# Patient Record
Sex: Female | Born: 1985 | Race: White | Hispanic: No | Marital: Married | Smoking: Current some day smoker
Health system: Southern US, Community
[De-identification: ages and names within clinical notes are randomized; demographics above are authoritative.]

## PROBLEM LIST (undated history)

## (undated) DIAGNOSIS — L4050 Arthropathic psoriasis, unspecified: Secondary | ICD-10-CM

## (undated) DIAGNOSIS — R519 Headache, unspecified: Secondary | ICD-10-CM

## (undated) DIAGNOSIS — F329 Major depressive disorder, single episode, unspecified: Secondary | ICD-10-CM

## (undated) DIAGNOSIS — R51 Headache: Secondary | ICD-10-CM

## (undated) DIAGNOSIS — F419 Anxiety disorder, unspecified: Secondary | ICD-10-CM

## (undated) DIAGNOSIS — F32A Depression, unspecified: Secondary | ICD-10-CM

## (undated) DIAGNOSIS — H7292 Unspecified perforation of tympanic membrane, left ear: Secondary | ICD-10-CM

## (undated) HISTORY — DX: Headache: R51

## (undated) HISTORY — PX: OTHER SURGICAL HISTORY: SHX169

## (undated) HISTORY — PX: MYRINGOTOMY WITH TUBE PLACEMENT: SHX5663

## (undated) HISTORY — DX: Headache, unspecified: R51.9

## (undated) HISTORY — DX: Arthropathic psoriasis, unspecified: L40.50

---
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
## 2007-01-21 ENCOUNTER — Encounter: Payer: Self-pay | Admitting: Obstetrics and Gynecology

## 2007-03-27 ENCOUNTER — Observation Stay: Payer: Self-pay | Admitting: Obstetrics and Gynecology

## 2007-04-16 ENCOUNTER — Observation Stay: Payer: Self-pay | Admitting: Obstetrics & Gynecology

## 2007-04-27 ENCOUNTER — Inpatient Hospital Stay: Payer: Self-pay

## 2010-09-10 ENCOUNTER — Emergency Department: Payer: Self-pay | Admitting: Emergency Medicine

## 2012-12-15 ENCOUNTER — Emergency Department: Payer: Self-pay | Admitting: Emergency Medicine

## 2012-12-30 ENCOUNTER — Emergency Department: Payer: Self-pay | Admitting: Emergency Medicine

## 2013-09-26 ENCOUNTER — Ambulatory Visit: Payer: Self-pay | Admitting: Family Medicine

## 2014-06-10 IMAGING — CT CT HEAD WITHOUT CONTRAST
1 series · 16 of 30 positions shown, 20 images · non-contrast
Comparison: Report from 05/04/2003 exam. Images not available for
direct review.

CLINICAL DATA: Headaches.

EXAM:
CT HEAD WITHOUT CONTRAST
TECHNIQUE: Contiguous axial images were obtained from the base of the skull
through the vertex without intravenous contrast.

[Series 2: head wo · axial · 0.43mm/px · z∈[-129,+15]mm · 16 of 36 slices shown, 20 images]
[im 2/36  brain]
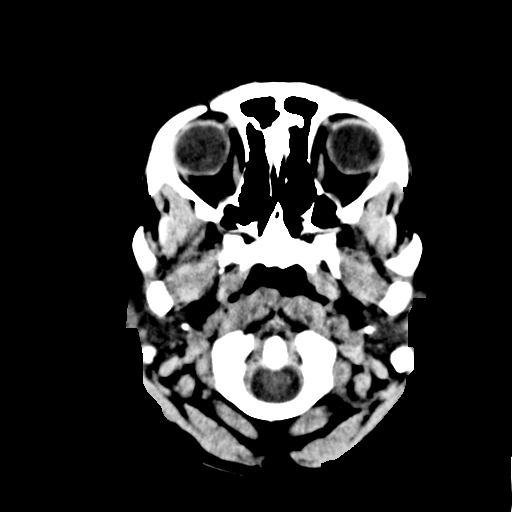
[im 2/36  bone]
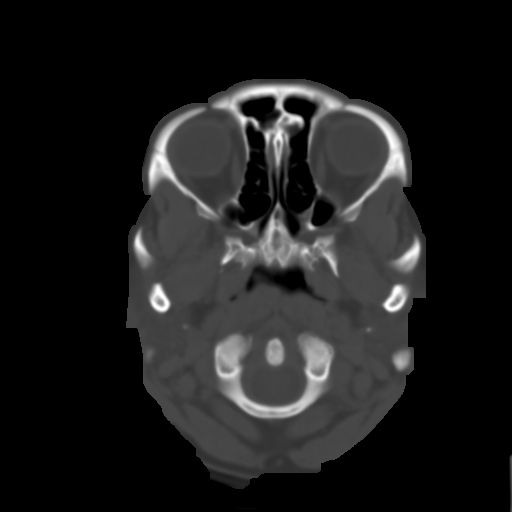
[im 4/36  brain]
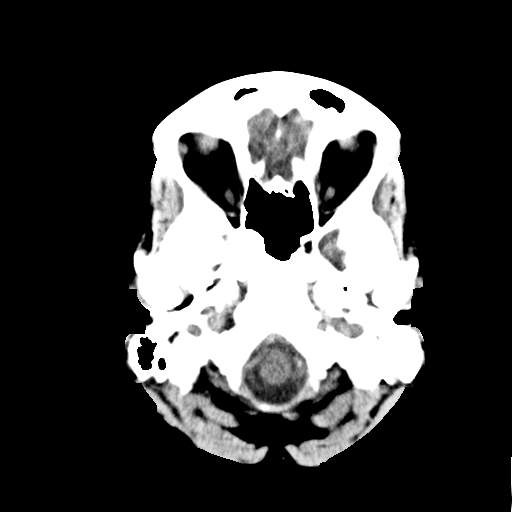
[im 7/36  brain]
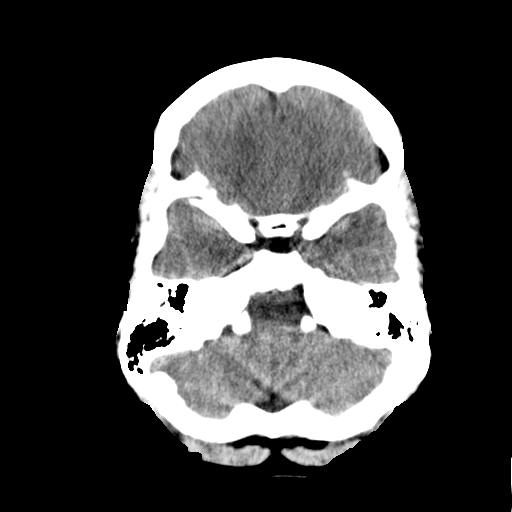
[im 9/36  brain]
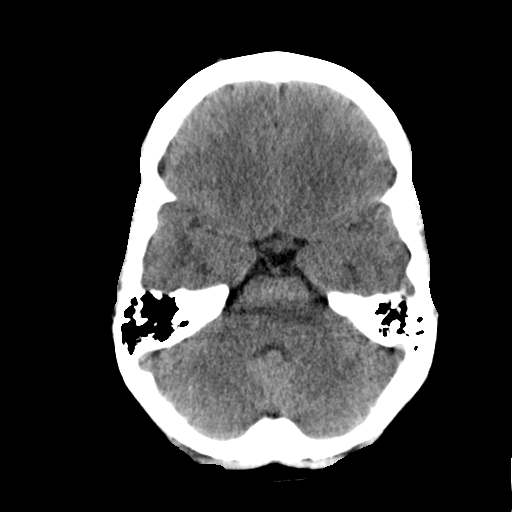
[im 10/36  brain]
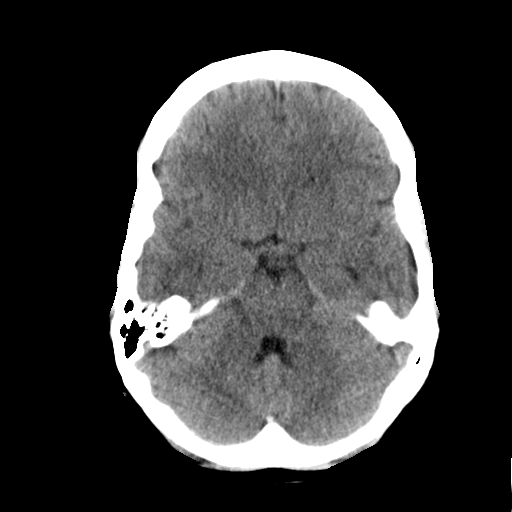
[im 10/36  bone]
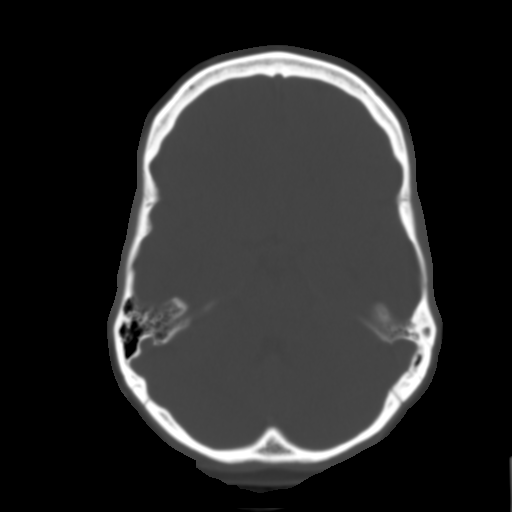
[im 13/36  brain]
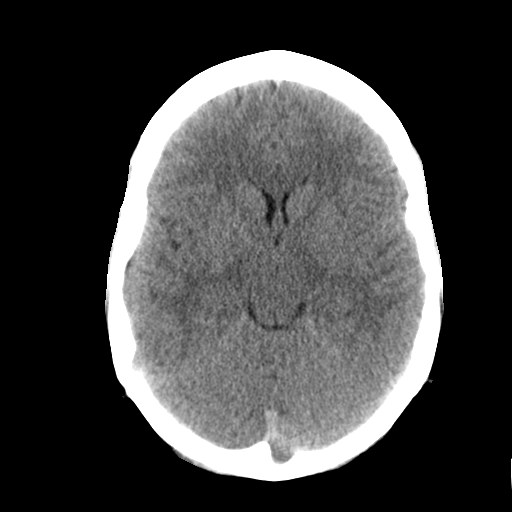
[im 15/36  brain]
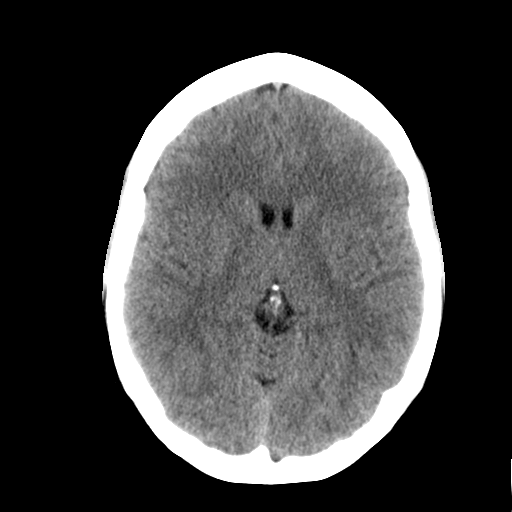
[im 17/36  brain]
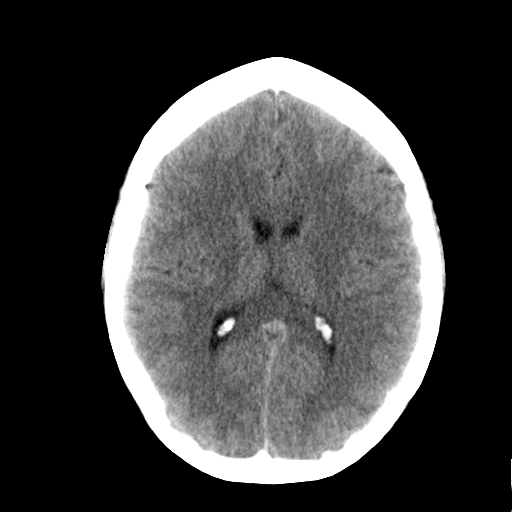
[im 19/36  brain]
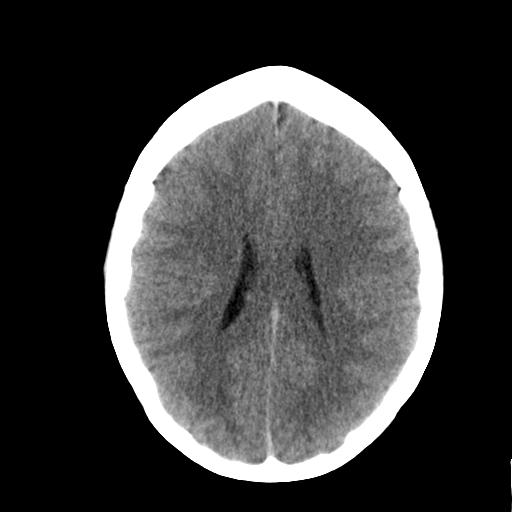
[im 19/36  bone]
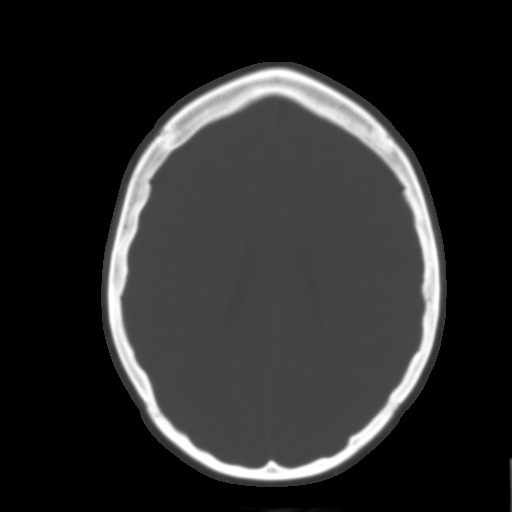
[im 21/36  brain]
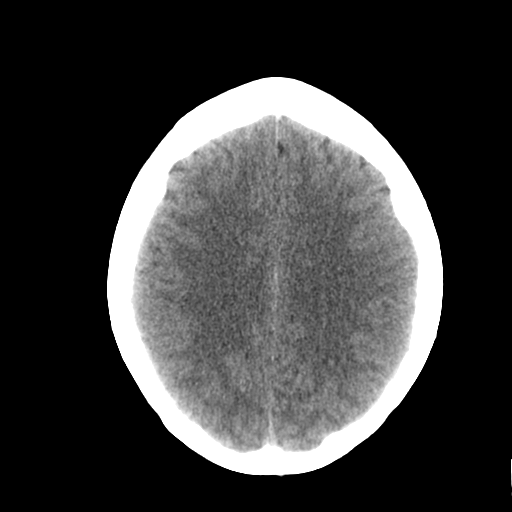
[im 23/36  brain]
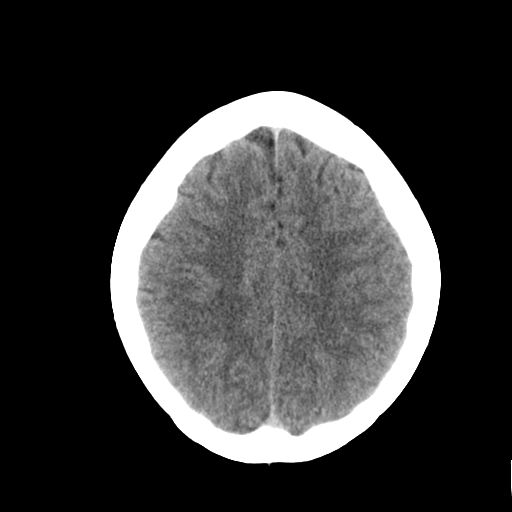
[im 26/36  brain]
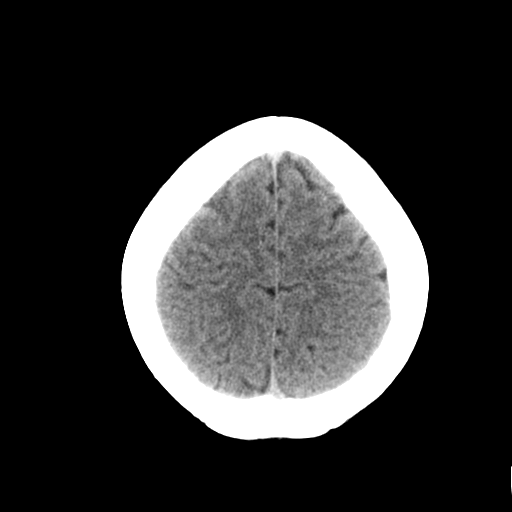
[im 27/36  brain]
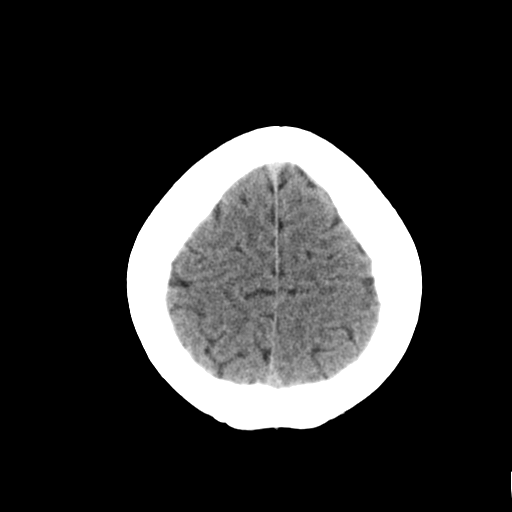
[im 27/36  bone]
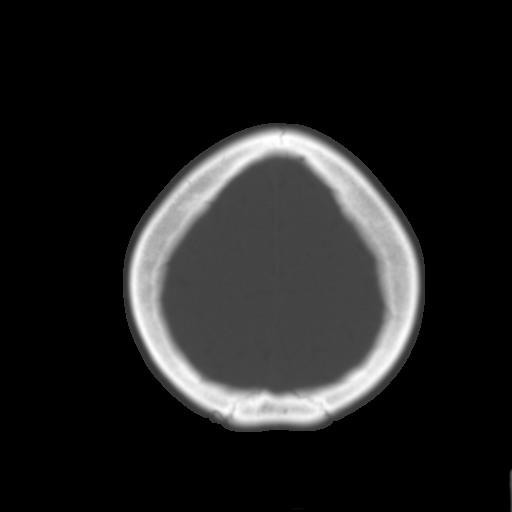
[im 29/36  brain]
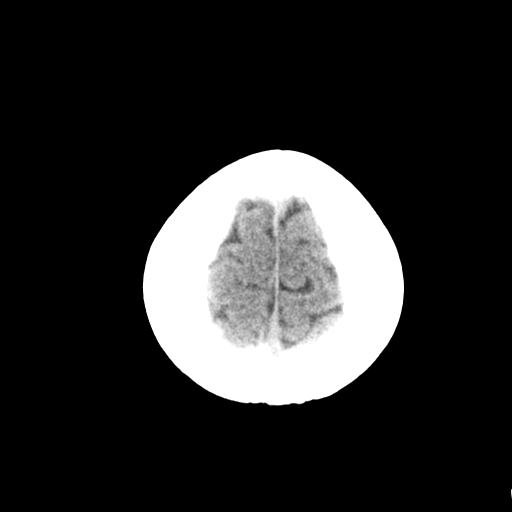
[im 32/36  brain]
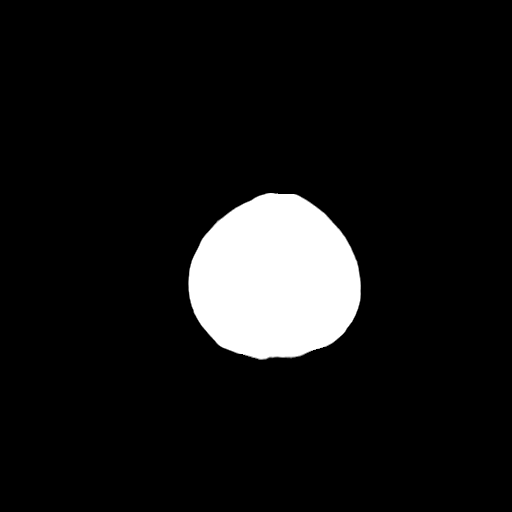
[im 34/36  brain]
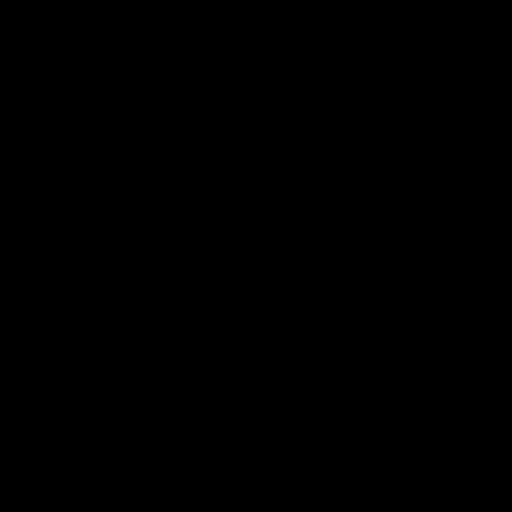

[16 of 30 positions shown; findings below may reference images not displayed]

FINDINGS: No intracranial hemorrhage.

No CT evidence of large acute infarct.

No hydrocephalus.

No intracranial mass lesion noted on this unenhanced exam.

Cerebellar tonsils may be minimally low lying but within the range
of normal limits.

Mastoid air cells, middle ear cavities and visualized paranasal
sinuses are clear.

If there are atypical headaches and further delineation is
clinically desired then MR imaging may be considered.
IMPRESSION: Negative unenhanced head CT as detailed above.

## 2014-06-19 ENCOUNTER — Emergency Department: Payer: Self-pay | Admitting: Emergency Medicine

## 2014-06-19 LAB — CBC WITH DIFFERENTIAL/PLATELET
Basophil #: 0.1 10*3/uL (ref 0.0–0.1)
Basophil %: 0.7 %
EOS PCT: 2.5 %
Eosinophil #: 0.3 10*3/uL (ref 0.0–0.7)
HCT: 43.1 % (ref 35.0–47.0)
HGB: 14.4 g/dL (ref 12.0–16.0)
LYMPHS PCT: 33.3 %
Lymphocyte #: 4 10*3/uL — ABNORMAL HIGH (ref 1.0–3.6)
MCH: 30 pg (ref 26.0–34.0)
MCHC: 33.4 g/dL (ref 32.0–36.0)
MCV: 90 fL (ref 80–100)
Monocyte #: 0.7 x10 3/mm (ref 0.2–0.9)
Monocyte %: 5.8 %
NEUTROS PCT: 57.7 %
Neutrophil #: 7 10*3/uL — ABNORMAL HIGH (ref 1.4–6.5)
PLATELETS: 318 10*3/uL (ref 150–440)
RBC: 4.81 10*6/uL (ref 3.80–5.20)
RDW: 12.3 % (ref 11.5–14.5)
WBC: 12.1 10*3/uL — ABNORMAL HIGH (ref 3.6–11.0)

## 2014-06-19 LAB — URINALYSIS, COMPLETE
BACTERIA: NONE SEEN
BILIRUBIN, UR: NEGATIVE
Blood: NEGATIVE
GLUCOSE, UR: NEGATIVE mg/dL (ref 0–75)
Ketone: NEGATIVE
Leukocyte Esterase: NEGATIVE
Nitrite: NEGATIVE
Ph: 5 (ref 4.5–8.0)
Protein: NEGATIVE
RBC,UR: 1 /HPF (ref 0–5)
SPECIFIC GRAVITY: 1.01 (ref 1.003–1.030)
Squamous Epithelial: 3

## 2014-06-19 LAB — COMPREHENSIVE METABOLIC PANEL
ANION GAP: 8 (ref 7–16)
AST: 31 U/L (ref 15–37)
Albumin: 3.7 g/dL (ref 3.4–5.0)
Alkaline Phosphatase: 71 U/L (ref 46–116)
BUN: 20 mg/dL — AB (ref 7–18)
Bilirubin,Total: 0.1 mg/dL — ABNORMAL LOW (ref 0.2–1.0)
CHLORIDE: 104 mmol/L (ref 98–107)
CO2: 26 mmol/L (ref 21–32)
Calcium, Total: 8.8 mg/dL (ref 8.5–10.1)
Creatinine: 0.81 mg/dL (ref 0.60–1.30)
Glucose: 102 mg/dL — ABNORMAL HIGH (ref 65–99)
Osmolality: 278 (ref 275–301)
POTASSIUM: 3.5 mmol/L (ref 3.5–5.1)
SGPT (ALT): 32 U/L (ref 14–63)
Sodium: 138 mmol/L (ref 136–145)
TOTAL PROTEIN: 7.9 g/dL (ref 6.4–8.2)

## 2014-06-19 LAB — TROPONIN I: Troponin-I: <0.02 ng/mL

## 2014-09-20 ENCOUNTER — Encounter
Admission: RE | Admit: 2014-09-20 | Discharge: 2014-09-20 | Disposition: A | Discharge status: Home / Self Care | Payer: Managed Care, Other (non HMO) | Source: Ambulatory Visit | Attending: Specialist | Admitting: Specialist

## 2014-09-20 DIAGNOSIS — Z029 Encounter for administrative examinations, unspecified: Secondary | ICD-10-CM | POA: Insufficient documentation

## 2014-09-20 HISTORY — DX: Major depressive disorder, single episode, unspecified: F32.9

## 2014-09-20 HISTORY — DX: Depression, unspecified: F32.A

## 2014-09-20 HISTORY — DX: Anxiety disorder, unspecified: F41.9

## 2014-09-20 NOTE — Patient Instructions (Signed)
  Your procedure is scheduled on5/20/16 Report to Day Surgery. To find out your arrival time please call [phone] between 1PM - 3PM on 09/28/14 .  Remember: Instructions that are not followed completely may result in serious medical risk, up to and including death, or upon the discretion of your surgeon and anesthesiologist your surgery may need to be rescheduled.    __x__ 1. Do not eat food or drink liquids after midnight. No gum chewing or hard candies.     ____ 2. No Alcohol for 24 hours before or after surgery.   ____ 3. Bring all medications with you on the day of surgery if instructed.    __x__ 4. Notify your doctor if there is any change in your medical condition     (cold, fever, infections).     Do not wear jewelry, make-up, hairpins, clips or nail polish.  Do not wear lotions, powders, or perfumes. You may wear deodorant.  Do not shave 48 hours prior to surgery. Men may shave face and neck.  Do not bring valuables to the hospital.    [facility] belongings or valuables.               Contacts, dentures or bridgework may not be worn into surgery.  Leave your suitcase in the car. After surgery it may be brought to your room.  For patients admitted to the hospital, discharge time is determined by your                treatment team.   Patients discharged the day of surgery will not be allowed to drive home.   Please read over the following fact sheets that you were given:     ____ Take these medicines the morning of surgery with A SIP OF WATER:    1.   2.   3.   4.  5.  6.  ____ Fleet Enema (as directed)   __x__ Use CHG Soap as directed  ____ Use inhalers on the day of surgery  ____ Stop metformin 2 days prior to surgery    ____ Take 1/2 of usual insulin dose the night before surgery and none on the morning of surgery.   ____ Stop Coumadin/Plavix/aspirin on  ____ Stop Anti-inflammatories on    ____ Stop supplements until after  surgery.    ____ Bring C-Pap to the hospital.

## 2014-09-29 ENCOUNTER — Ambulatory Visit: Payer: Managed Care, Other (non HMO) | Admitting: Certified Registered Nurse Anesthetist

## 2014-09-29 ENCOUNTER — Ambulatory Visit
Admission: RE | Admit: 2014-09-29 | Discharge: 2014-09-29 | Disposition: A | Discharge status: Home / Self Care | Payer: Managed Care, Other (non HMO) | Source: Ambulatory Visit | Attending: Specialist | Admitting: Specialist

## 2014-09-29 ENCOUNTER — Encounter
Admission: RE | Disposition: A | Discharge status: Home / Self Care | Payer: Self-pay | Source: Ambulatory Visit | Attending: Specialist

## 2014-09-29 DIAGNOSIS — G5601 Carpal tunnel syndrome, right upper limb: Secondary | ICD-10-CM | POA: Insufficient documentation

## 2014-09-29 DIAGNOSIS — Z886 Allergy status to analgesic agent status: Secondary | ICD-10-CM | POA: Diagnosis not present

## 2014-09-29 DIAGNOSIS — Z809 Family history of malignant neoplasm, unspecified: Secondary | ICD-10-CM | POA: Diagnosis not present

## 2014-09-29 DIAGNOSIS — F17200 Nicotine dependence, unspecified, uncomplicated: Secondary | ICD-10-CM | POA: Diagnosis not present

## 2014-09-29 DIAGNOSIS — Z8261 Family history of arthritis: Secondary | ICD-10-CM | POA: Insufficient documentation

## 2014-09-29 DIAGNOSIS — Z79899 Other long term (current) drug therapy: Secondary | ICD-10-CM | POA: Insufficient documentation

## 2014-09-29 HISTORY — PX: CARPAL TUNNEL RELEASE: SHX101

## 2014-09-29 LAB — POCT PREGNANCY, URINE: Preg Test, Ur: NEGATIVE

## 2014-09-29 SURGERY — CARPAL TUNNEL RELEASE
Anesthesia: General | Laterality: Right | Wound class: Clean

## 2014-09-29 MED ORDER — LABETALOL HCL 5 MG/ML IV SOLN
INTRAVENOUS | Status: AC
  Filled 2014-09-29: qty 4

## 2014-09-29 MED ORDER — FENTANYL CITRATE (PF) 100 MCG/2ML IJ SOLN
INTRAMUSCULAR | Status: AC
  Filled 2014-09-29: qty 2

## 2014-09-29 MED ORDER — MIDAZOLAM HCL 2 MG/2ML IJ SOLN
INTRAMUSCULAR | Status: DC | PRN
  Administered 2014-09-29: 2 mg via INTRAVENOUS

## 2014-09-29 MED ORDER — FAMOTIDINE 20 MG PO TABS
20.0000 mg | ORAL_TABLET | Freq: Once | ORAL | Status: AC
  Administered 2014-09-29: 20 mg via ORAL

## 2014-09-29 MED ORDER — CHLORHEXIDINE GLUCONATE 4 % EX LIQD
60.0000 mL | Freq: Once | CUTANEOUS | Status: AC
  Administered 2014-09-29: 4 via TOPICAL

## 2014-09-29 MED ORDER — PROPOFOL 10 MG/ML IV BOLUS
INTRAVENOUS | Status: DC | PRN
  Administered 2014-09-29: 20 mg via INTRAVENOUS
  Administered 2014-09-29: 180 mg via INTRAVENOUS

## 2014-09-29 MED ORDER — GLYCOPYRROLATE 0.2 MG/ML IJ SOLN
INTRAMUSCULAR | Status: DC | PRN
  Administered 2014-09-29: 0.2 mg via INTRAVENOUS

## 2014-09-29 MED ORDER — LACTATED RINGERS IV SOLN
INTRAVENOUS | Status: DC
  Administered 2014-09-29: 07:00:00 via INTRAVENOUS

## 2014-09-29 MED ORDER — FENTANYL CITRATE (PF) 100 MCG/2ML IJ SOLN
25.0000 ug | INTRAMUSCULAR | Status: DC | PRN
  Administered 2014-09-29 (×2): 25 ug via INTRAVENOUS

## 2014-09-29 MED ORDER — ONDANSETRON HCL 4 MG/2ML IJ SOLN
INTRAMUSCULAR | Status: DC | PRN
  Administered 2014-09-29: 4 mg via INTRAVENOUS

## 2014-09-29 MED ORDER — ONDANSETRON HCL 4 MG/2ML IJ SOLN: 4.0000 mg | Freq: Once | INTRAMUSCULAR | Status: DC | PRN

## 2014-09-29 MED ORDER — LABETALOL HCL 5 MG/ML IV SOLN: 2.0000 mg | Freq: Once | INTRAVENOUS | Status: DC

## 2014-09-29 MED ORDER — FAMOTIDINE 20 MG PO TABS
ORAL_TABLET | ORAL | Status: AC
  Administered 2014-09-29: 20 mg via ORAL
  Filled 2014-09-29: qty 1

## 2014-09-29 MED ORDER — BUPIVACAINE HCL (PF) 0.5 % IJ SOLN
INTRAMUSCULAR | Status: AC
  Filled 2014-09-29: qty 30

## 2014-09-29 MED ORDER — BUPIVACAINE HCL 0.5 % IJ SOLN
INTRAMUSCULAR | Status: DC | PRN
  Administered 2014-09-29: 7 mL

## 2014-09-29 MED ORDER — HYDROCODONE-ACETAMINOPHEN 5-325 MG PO TABS: 1.0000 | ORAL_TABLET | Freq: Four times a day (QID) | ORAL | Status: DC | PRN

## 2014-09-29 MED ORDER — FENTANYL CITRATE (PF) 100 MCG/2ML IJ SOLN
INTRAMUSCULAR | Status: DC | PRN
  Administered 2014-09-29: 50 ug via INTRAVENOUS
  Administered 2014-09-29 (×2): 25 ug via INTRAVENOUS

## 2014-09-29 MED ORDER — LIDOCAINE HCL (CARDIAC) 20 MG/ML IV SOLN
INTRAVENOUS | Status: DC | PRN
  Administered 2014-09-29: 100 mg via INTRAVENOUS

## 2014-09-29 SURGICAL SUPPLY — 22 items
BANDAGE ELASTIC 3 CLIP NS LF (GAUZE/BANDAGES/DRESSINGS) ×3 IMPLANT
BNDG ESMARK 4X12 TAN STRL LF (GAUZE/BANDAGES/DRESSINGS) ×3 IMPLANT
CANISTER SUCT 1200ML W/VALVE (MISCELLANEOUS) ×3 IMPLANT
CAST PADDING 3X4FT ST 30246 (SOFTGOODS) ×4
CHLORAPREP W/TINT 26ML (MISCELLANEOUS) ×3 IMPLANT
GAUZE PETRO XEROFOAM 1X8 (MISCELLANEOUS) ×3 IMPLANT
GAUZE SPONGE 4X4 12PLY STRL (GAUZE/BANDAGES/DRESSINGS) ×3 IMPLANT
GLOVE BIO SURGEON STRL SZ7.5 (GLOVE) ×3 IMPLANT
GOWN STRL REUS W/ TWL LRG LVL3 (GOWN DISPOSABLE) ×2 IMPLANT
GOWN STRL REUS W/TWL LRG LVL3 (GOWN DISPOSABLE) ×4
KIT RM TURNOVER STRD PROC AR (KITS) ×3 IMPLANT
NS IRRIG 500ML POUR BTL (IV SOLUTION) ×3 IMPLANT
PACK EXTREMITY ARMC (MISCELLANEOUS) ×3 IMPLANT
PAD CAST CTTN 3X4 STRL (SOFTGOODS) ×2 IMPLANT
PAD GROUND ADULT SPLIT (MISCELLANEOUS) ×3 IMPLANT
PAD PREP 24X41 OB/GYN DISP (PERSONAL CARE ITEMS) IMPLANT
PADDING CAST 3IN STRL (MISCELLANEOUS) ×2
PADDING CAST BLEND 3X4 STRL (MISCELLANEOUS) ×1 IMPLANT
STOCKINETTE STRL 4IN 9604848 (GAUZE/BANDAGES/DRESSINGS) ×3 IMPLANT
SUT ETHILON 4-0 (SUTURE) ×2
SUT ETHILON 4-0 FS2 18XMFL BLK (SUTURE) ×1
SUTURE ETHLN 4-0 FS2 18XMF BLK (SUTURE) ×1 IMPLANT

## 2014-09-29 NOTE — Discharge Instructions (Signed)
May remove entire bandage in 24 hours, bathe, get wet, apply Band aid and wear brace prn Elevate hand as much as possible for 48 hours

## 2014-09-29 NOTE — Anesthesia Postprocedure Evaluation (Signed)
  Anesthesia Post-op Note  Patient: Savannah [name]  Procedure(s) Performed: Procedure(s): CARPAL TUNNEL RELEASE (Right)  Anesthesia type:General LMA  Patient location: PACU  Post pain: Pain level controlled  Post assessment: Post-op Vital signs reviewed, Patient's Cardiovascular Status Stable, Respiratory Function Stable, Patent Airway and No signs of Nausea or vomiting  Post vital signs: Reviewed and stable  Last Vitals:  Filed Vitals:   09/29/14 0811  BP:   Pulse:   Temp: 36.6 C  Resp:     Level of consciousness: awake, alert  and patient cooperative  Complications: No apparent anesthesia complications

## 2014-09-29 NOTE — Addendum Note (Signed)
Addendum  created 09/29/14 0940 by Malva Coganatherine Joniel Graumann, CRNA   Modules edited: Anesthesia Flowsheet

## 2014-09-29 NOTE — Anesthesia Preprocedure Evaluation (Signed)
Anesthesia Evaluation  Patient identified by MRN, date of birth, ID band Patient awake    Reviewed: Allergy & Precautions, H&P , NPO status , Patient's Chart, lab work & pertinent test results, reviewed documented beta blocker date and time   Airway Mallampati: II  TM Distance: >3 FB Neck ROM: full    Dental   Pulmonary Current Smoker,          Cardiovascular Rate:Normal     Neuro/Psych    GI/Hepatic   Endo/Other    Renal/GU      Musculoskeletal   Abdominal   Peds  Hematology   Anesthesia Other Findings   Reproductive/Obstetrics                             Anesthesia Physical Anesthesia Plan  ASA: II  Anesthesia Plan: General LMA   Post-op Pain Management:    Induction:   Airway Management Planned:   Additional Equipment:   Intra-op Plan:   Post-operative Plan:   Informed Consent: I have reviewed the patients History and Physical, chart, labs and discussed the procedure including the risks, benefits and alternatives for the proposed anesthesia with the patient or authorized representative who has indicated his/her understanding and acceptance.     Plan Discussed with: CRNA  Anesthesia Plan Comments:         Anesthesia Quick Evaluation  

## 2014-09-29 NOTE — Brief Op Note (Signed)
09/29/2014  8:15 AM  PATIENT:  Savannah [name]  29 y.o. female  PRE-OPERATIVE DIAGNOSIS:  CARPAL TUNNEL SYNDROME  POST-OPERATIVE DIAGNOSIS:  CARPAL TUNNEL SYNDROME  PROCEDURE:  Procedure(s): CARPAL TUNNEL RELEASE (Right)  SURGEON:  Surgeon(s) and Role:    * Myra Rudehristopher Neven Fina, MD - Primary  PHYSICIAN ASSISTANT:   ASSISTANTS: none   ANESTHESIA:   general  EBL:  Total I/O In: 500 [I.V.:500] Out: -   BLOOD ADMINISTERED:none  DRAINS: none   LOCAL MEDICATIONS USED:  MARCAINE     SPECIMEN:  No Specimen  DISPOSITION OF SPECIMEN:  N/A  COUNTS:  YES  TOURNIQUET:    DICTATION: .Other Dictation: Dictation Number [id number]  PLAN OF CARE: Discharge to home after PACU  PATIENT DISPOSITION:  PACU - hemodynamically stable.   Delay start of Pharmacological VTE agent (>24hrs) due to surgical blood loss or risk of bleeding: not applicable

## 2014-09-29 NOTE — Transfer of Care (Signed)
Immediate Anesthesia Transfer of Care Note  Patient: Savannah [name]  Procedure(s) Performed: Procedure(s): CARPAL TUNNEL RELEASE (Right)  Patient Location: PACU  Anesthesia Type:General  Level of Consciousness: awake, alert  and oriented  Airway & Oxygen Therapy: Patient Spontanous Breathing and Patient connected to face mask oxygen  Post-op Assessment: Report given to RN and Post -op Vital signs reviewed and stable  Post vital signs: Reviewed and stable  Last Vitals:  Filed Vitals:   09/29/14 0810  BP: 131/95  Pulse: 93  Temp: 36.5 C  Resp: 16    Complications: No apparent anesthesia complications

## 2014-09-29 NOTE — Op Note (Signed)
NAMRon Agee:  Archambeau, Shadow             ACCOUNT NO.:  [id number]  MEDICAL RECORD NO.:  [id number]  LOCATION:  [facility]                         FACILITY:  [facility]  PHYSICIAN:  Reita Chardhris Misk Galentine, MD        DATE OF BIRTH:  [birth year]  DATE OF PROCEDURE:  09/29/2014 DATE OF DISCHARGE:  09/29/2014                              OPERATIVE REPORT   PREOPERATIVE DIAGNOSIS:  Right carpal tunnel syndrome.  POSTOPERATIVE DIAGNOSIS:  Right carpal tunnel syndrome.  PROCEDURE PERFORMED:  Right carpal tunnel release.  SURGEON:  Reita Chardhris Mariah Gerstenberger, MD  ANESTHESIA:  General.  COMPLICATIONS:  None.  TOURNIQUET TIME:  15 minutes.  DESCRIPTION OF PROCEDURE:  After adequate induction of general anesthesia, the right upper extremity is thoroughly prepped with alcohol and ChloraPrep and draped in standard sterile fashion.  The extremity is wrapped out with Esmarch bandage and pneumatic tourniquet elevated to 250 mmHg.  Under loupe magnification, standard volar carpal tunnel incision is made, and the dissection carried down to the transverse retinacular ligament.  This is incised in the midportion with the knife. The distal release is performed with the small scissors and the proximal release is performed with the small scissors and the carpal tunnel scissors.  There is seen to be moderate to severe compression of the nerve directly beneath the ligament.  There is no mass lesion present and mild synovial hypertrophy.  The wound is thoroughly irrigated multiple times.  Skin is closed with 4-0 nylon.  The skin is infiltrated with 0.5% plain Marcaine.  A soft bulky dressing is applied, tourniquet is released.  The patient is returned to the recovery room in satisfactory condition having tolerated the procedure quite well.          ______________________________ Reita Chardhris Merick Kelleher, MD     CS/MEDQ  D:  09/29/2014  T:  09/29/2014  Job:  [id number]

## 2014-09-29 NOTE — H&P (Signed)
  29 year old female with right carpal tunnel syndrome as per H and P included in office notes appended to chart.  Procedure fully explained to patient including expected result and post op rehab.  Heart and lungs are clear.  ENT exam normal.  Plan: right carpal tunnel release this am.

## 2014-10-02 ENCOUNTER — Encounter: Payer: Self-pay | Admitting: Specialist

## 2014-12-01 ENCOUNTER — Ambulatory Visit: Payer: Managed Care, Other (non HMO) | Admitting: Psychiatry

## 2015-01-10 ENCOUNTER — Emergency Department
Admission: EM | Admit: 2015-01-10 | Discharge: 2015-01-10 | Disposition: A | Discharge status: Home / Self Care | Payer: Managed Care, Other (non HMO) | Attending: Emergency Medicine | Admitting: Emergency Medicine

## 2015-01-10 ENCOUNTER — Encounter: Payer: Self-pay | Admitting: Emergency Medicine

## 2015-01-10 ENCOUNTER — Emergency Department: Payer: Managed Care, Other (non HMO)

## 2015-01-10 DIAGNOSIS — R21 Rash and other nonspecific skin eruption: Secondary | ICD-10-CM | POA: Diagnosis not present

## 2015-01-10 DIAGNOSIS — R197 Diarrhea, unspecified: Secondary | ICD-10-CM | POA: Diagnosis not present

## 2015-01-10 DIAGNOSIS — Z720 Tobacco use: Secondary | ICD-10-CM | POA: Diagnosis not present

## 2015-01-10 DIAGNOSIS — Z79899 Other long term (current) drug therapy: Secondary | ICD-10-CM | POA: Insufficient documentation

## 2015-01-10 DIAGNOSIS — R109 Unspecified abdominal pain: Secondary | ICD-10-CM | POA: Insufficient documentation

## 2015-01-10 DIAGNOSIS — Z872 Personal history of diseases of the skin and subcutaneous tissue: Secondary | ICD-10-CM | POA: Insufficient documentation

## 2015-01-10 DIAGNOSIS — M25571 Pain in right ankle and joints of right foot: Secondary | ICD-10-CM | POA: Diagnosis present

## 2015-01-10 MED ORDER — ACETAMINOPHEN 325 MG PO TABS
650.0000 mg | ORAL_TABLET | Freq: Once | ORAL | Status: AC
  Administered 2015-01-10: 650 mg via ORAL
  Filled 2015-01-10: qty 2

## 2015-01-10 NOTE — ED Provider Notes (Signed)
[facility] Emergency Department Provider Note  ____________________________________________  Time seen: 5:20 AM  I have reviewed the triage vital signs and the nursing notes.   HISTORY  Chief Complaint Ankle Pain    HPI Savannah [name] is a 29 y.o. female presents with nontraumatic right ankle pain 2 weeks. Patient reports pain and swelling with ambulation. Patient admits to history of psoriasis and S psoriatic rash over the right ankle that is been having pain and discomfort. Patient denies any fever      Past Medical History  Diagnosis Date  . Depression   . Anxiety     There are no active problems to display for this patient.   Past Surgical History  Procedure Laterality Date  . Myringotomy with tube placement    . Cesarean section    . Carpal tunnel release Right 09/29/2014    Procedure: CARPAL TUNNEL RELEASE;  Surgeon: Myra Rude, MD;  Location: [facility] ORS;  Service: Orthopedics;  Laterality: Right;    Current Outpatient Rx  Name  Route  Sig  Dispense  Refill  . busPIRone (BUSPAR) 5 MG tablet   Oral   Take 5 mg by mouth 2 (two) times daily.         Marland Kitchen HYDROcodone-acetaminophen (NORCO) 5-325 MG per tablet   Oral   Take 1 tablet by mouth every 6 (six) hours as needed for moderate pain.   30 tablet   0   . omeprazole (PRILOSEC) 20 MG capsule   Oral   Take 20 mg by mouth 2 (two) times daily before a meal.         . venlafaxine (EFFEXOR) 75 MG tablet   Oral   Take 75 mg by mouth daily. am           Allergies Ibuprofen  No family history on file.  Social History Social History  Substance Use Topics  . Smoking status: Current Every Day Smoker -- 0.25 packs/day    Types: Cigarettes  . Smokeless tobacco: Never Used  . Alcohol Use: No    Review of Systems  Constitutional: Negative for fever. Eyes: Negative for visual changes. ENT: Negative for sore throat. Cardiovascular: Negative for chest pain. Respiratory:  Negative for shortness of breath. Gastrointestinal: Negative for abdominal pain, vomiting and diarrhea. Genitourinary: Negative for dysuria. Musculoskeletal: Negative for back pain. Positive right ankle pain Skin: Positive for rash. Neurological: Negative for headaches, focal weakness or numbness.   10-point ROS otherwise negative.  ____________________________________________   PHYSICAL EXAM:  VITAL SIGNS: ED Triage Vitals  Enc Vitals Group     BP 01/10/15 0257 112/66 mmHg     Pulse Rate 01/10/15 0257 88     Resp 01/10/15 0257 18     Temp 01/10/15 0257 97.6 F (36.4 C)     Temp Source 01/10/15 0257 Oral     SpO2 01/10/15 0257 100 %     Weight 01/10/15 0257 165 lb (74.844 kg)     Height 01/10/15 0257  (1.6 m)     Head Cir --      Peak Flow --      Pain Score 01/10/15 0257 6     Pain Loc --      Pain Edu? --      Excl. in GC? --     Constitutional: Alert and oriented. Well appearing and in no distress. Musculoskeletal: Nontender with normal range of motion in all extremities. No joint effusions.  No lower extremity tenderness nor  edema. Mild tenderness to palpation posterior right medial malleoli Neurologic:  Normal speech and language. No gross focal neurologic deficits are appreciated. Speech is normal.  Skin:  Psoriatic rash noted over right medial malleoli Psychiatric: Mood and affect are normal. Speech and behavior are normal. Patient exhibits appropriate insight and judgment.    RADIOLOGY  DG Ankle 2 Views Right (Final result) Result time: 01/10/15 05:36:26   Final result by Rad Results In Interface (01/10/15 05:36:26)   Narrative:   CLINICAL DATA: Nontraumatic right ankle pain and swelling for 2 weeks.  EXAM: RIGHT ANKLE - 2 VIEW  COMPARISON: None.  FINDINGS: There is no evidence of fracture, dislocation, or joint effusion. There is no evidence of arthropathy or other focal bone abnormality. Soft tissues are  unremarkable.  IMPRESSION: Negative.   Electronically Signed By: Ellery Plunk M.D. On: 01/10/2015 05:36         INITIAL IMPRESSION / ASSESSMENT AND PLAN / ED COURSE  Pertinent labs & imaging results that were available during my care of the patient were reviewed by me and considered in my medical decision making (see chart for details).  Right ankle x-ray revealed no gross abnormalities per radiologist. Afebrile patient, psoriatic rash does not appear to be infected. Patient pain score the time of evaluation was for no active swelling noted at the joint space. The area was not warm to touch.  ____________________________________________   FINAL CLINICAL IMPRESSION(S) / ED DIAGNOSES  Final diagnoses:  Ankle pain, right      Darci Current, MD 01/12/15 0430

## 2015-01-10 NOTE — ED Notes (Addendum)
Patient ambulatory to triage with steady gait, without difficulty or distress noted; pt reports right ankle pain/swelling x 2weeks; denies any known injury; pt with ace wrap in place to ankle; upon removal, noted dry flaky red skin noted--st psoriasis

## 2015-01-10 NOTE — ED Notes (Signed)
Patient presents to ED with c/o of right ankle pain and swelling x 2 weeks. Reports pain increases when attempting to ambulate and place weight on ankle. Skin red, dry, flaky noted to right ankle, reports has psoriasis. Patient alert and oriented x 4, respirations even and unlabored, + pedal pulses, capillary refill less than 3 sec. Slight swelling noted to ankle.

## 2015-01-10 NOTE — ED Notes (Signed)

## 2015-01-10 NOTE — Discharge Instructions (Signed)
Ankle Pain Ankle pain is a common symptom. The bones, cartilage, tendons, and muscles of the ankle joint perform a lot of work each day. The ankle joint holds your body weight and allows you to move around. Ankle pain can occur on either side or back of 1 or both ankles. Ankle pain may be sharp and burning or dull and aching. There may be tenderness, stiffness, redness, or warmth around the ankle. The pain occurs more often when a person walks or puts pressure on the ankle. CAUSES  There are many reasons ankle pain can develop. It is important to work with your caregiver to identify the cause since many conditions can impact the bones, cartilage, muscles, and tendons. Causes for ankle pain include:  Injury, including a break (fracture), sprain, or strain often due to a fall, sports, or a high-impact activity.  Swelling (inflammation) of a tendon (tendonitis).  Achilles tendon rupture.  Ankle instability after repeated sprains and strains.  Poor foot alignment.  Pressure on a nerve (tarsal tunnel syndrome).  Arthritis in the ankle or the lining of the ankle.  Crystal formation in the ankle (gout or pseudogout). DIAGNOSIS  A diagnosis is based on your medical history, your symptoms, results of your physical exam, and results of diagnostic tests. Diagnostic tests may include X-ray exams or a computerized magnetic scan (magnetic resonance imaging, MRI). TREATMENT  Treatment will depend on the cause of your ankle pain and may include:  Keeping pressure off the ankle and limiting activities.  Using crutches or other walking support (a cane or brace).  Using rest, ice, compression, and elevation.  Participating in physical therapy or home exercises.  Wearing shoe inserts or special shoes.  Losing weight.  Taking medications to reduce pain or swelling or receiving an injection.  Undergoing surgery. HOME CARE INSTRUCTIONS   Only take over-the-counter or prescription medicines for  pain, discomfort, or fever as directed by your caregiver.  Put ice on the injured area.  Put ice in a plastic bag.  Place a towel between your skin and the bag.  Leave the ice on for 15-20 minutes at a time, 03-04 times a day.  Keep your leg raised (elevated) when possible to lessen swelling.  Avoid activities that cause ankle pain.  Follow specific exercises as directed by your caregiver.  Record how often you have ankle pain, the location of the pain, and what it feels like. This information may be helpful to you and your caregiver.  Ask your caregiver about returning to work or sports and whether you should drive.  Follow up with your caregiver for further examination, therapy, or testing as directed. SEEK MEDICAL CARE IF:   Pain or swelling continues or worsens beyond 1 week.  You have an oral temperature above 102 F (38.9 C).  You are feeling unwell or have chills.  You are having an increasingly difficult time with walking.  You have loss of sensation or other new symptoms.  You have questions or concerns. MAKE SURE YOU:   Understand these instructions.  Will watch your condition.  Will get help right away if you are not doing well or get worse. Document Released: 10/16/2009 Document Revised: 07/21/2011 Document Reviewed: 10/16/2009 [facility] Patient Information 2015 [place], [place]. This information is not intended to replace advice given to you by your health care provider. Make sure you discuss any questions you have with your health care provider.  Ankle Pain Ankle pain is a common symptom. The bones, cartilage, tendons, and  muscles of the ankle joint perform a lot of work each day. The ankle joint holds your body weight and allows you to move around. Ankle pain can occur on either side or back of 1 or both ankles. Ankle pain may be sharp and burning or dull and aching. There may be tenderness, stiffness, redness, or warmth around the ankle. The pain occurs  more often when a person walks or puts pressure on the ankle. CAUSES  There are many reasons ankle pain can develop. It is important to work with your caregiver to identify the cause since many conditions can impact the bones, cartilage, muscles, and tendons. Causes for ankle pain include:  Injury, including a break (fracture), sprain, or strain often due to a fall, sports, or a high-impact activity.  Swelling (inflammation) of a tendon (tendonitis).  Achilles tendon rupture.  Ankle instability after repeated sprains and strains.  Poor foot alignment.  Pressure on a nerve (tarsal tunnel syndrome).  Arthritis in the ankle or the lining of the ankle.  Crystal formation in the ankle (gout or pseudogout). DIAGNOSIS  A diagnosis is based on your medical history, your symptoms, results of your physical exam, and results of diagnostic tests. Diagnostic tests may include X-ray exams or a computerized magnetic scan (magnetic resonance imaging, MRI). TREATMENT  Treatment will depend on the cause of your ankle pain and may include:  Keeping pressure off the ankle and limiting activities.  Using crutches or other walking support (a cane or brace).  Using rest, ice, compression, and elevation.  Participating in physical therapy or home exercises.  Wearing shoe inserts or special shoes.  Losing weight.  Taking medications to reduce pain or swelling or receiving an injection.  Undergoing surgery. HOME CARE INSTRUCTIONS   Only take over-the-counter or prescription medicines for pain, discomfort, or fever as directed by your caregiver.  Put ice on the injured area.  Put ice in a plastic bag.  Place a towel between your skin and the bag.  Leave the ice on for 15-20 minutes at a time, 03-04 times a day.  Keep your leg raised (elevated) when possible to lessen swelling.  Avoid activities that cause ankle pain.  Follow specific exercises as directed by your caregiver.  Record how  often you have ankle pain, the location of the pain, and what it feels like. This information may be helpful to you and your caregiver.  Ask your caregiver about returning to work or sports and whether you should drive.  Follow up with your caregiver for further examination, therapy, or testing as directed. SEEK MEDICAL CARE IF:   Pain or swelling continues or worsens beyond 1 week.  You have an oral temperature above 102 F (38.9 C).  You are feeling unwell or have chills.  You are having an increasingly difficult time with walking.  You have loss of sensation or other new symptoms.  You have questions or concerns. MAKE SURE YOU:   Understand these instructions.  Will watch your condition.  Will get help right away if you are not doing well or get worse. Document Released: 10/16/2009 Document Revised: 07/21/2011 Document Reviewed: 10/16/2009 [facility] Patient Information 2015 [place], [place]. This information is not intended to replace advice given to you by your health care provider. Make sure you discuss any questions you have with your health care provider.

## 2015-01-11 ENCOUNTER — Ambulatory Visit (INDEPENDENT_AMBULATORY_CARE_PROVIDER_SITE_OTHER): Payer: Managed Care, Other (non HMO) | Admitting: Family Medicine

## 2015-01-11 ENCOUNTER — Encounter: Payer: Self-pay | Admitting: Family Medicine

## 2015-01-11 VITALS — BP 119/77 | HR 92 | Temp 98.0°F | Resp 16 | Ht 63.0 in | Wt 166.0 lb

## 2015-01-11 DIAGNOSIS — M25571 Pain in right ankle and joints of right foot: Secondary | ICD-10-CM

## 2015-01-11 DIAGNOSIS — F329 Major depressive disorder, single episode, unspecified: Secondary | ICD-10-CM

## 2015-01-11 DIAGNOSIS — L409 Psoriasis, unspecified: Secondary | ICD-10-CM | POA: Diagnosis not present

## 2015-01-11 DIAGNOSIS — F32A Depression, unspecified: Secondary | ICD-10-CM

## 2015-01-11 DIAGNOSIS — M25579 Pain in unspecified ankle and joints of unspecified foot: Secondary | ICD-10-CM | POA: Insufficient documentation

## 2015-01-11 MED ORDER — FLUOCINONIDE 0.1 % EX CREA: TOPICAL_CREAM | CUTANEOUS | Status: DC

## 2015-01-11 MED ORDER — MELOXICAM 15 MG PO TABS: 15.0000 mg | ORAL_TABLET | Freq: Every day | ORAL | Status: DC

## 2015-01-11 NOTE — Progress Notes (Signed)
Name: Savannah [name]   MRN: [id number]    DOB: 1985-09-21   Date:01/11/2015       Progress Note  Subjective  Chief Complaint  Chief Complaint  Patient presents with  . Ankle Pain    Pt was seen in the ED for RT ankle pain 01/10/15. She had x-ray done that showed no broken bones or fx.    HPI R. Ankle pain for 2 weeks.  No trauma remembered.  Seen in ER 2 days ago.  X-rays and eval. Neg.  Tylenol not help[.  Shed gets gastritis with Ibuprofen.  No problem-specific assessment & plan notes found for this encounter.   Past Medical History  Diagnosis Date  . Depression   . Anxiety     Social History  Substance Use Topics  . Smoking status: Current Every Day Smoker -- 0.25 packs/day    Types: Cigarettes  . Smokeless tobacco: Never Used  . Alcohol Use: No     Current outpatient prescriptions:  .  venlafaxine XR (EFFEXOR-XR) 75 MG 24 hr capsule, Take 75 mg by mouth every morning., Disp: , Rfl: 3  Allergies  Allergen Reactions  . Ibuprofen     Burns stomach    Review of Systems  Constitutional: Negative for fever, chills and malaise/fatigue.  HENT: Negative for hearing loss.   Eyes: Negative for blurred vision and double vision.  Respiratory: Negative for cough, sputum production, shortness of breath and wheezing.   Cardiovascular: Negative for chest pain, palpitations, claudication and leg swelling.  Gastrointestinal: Negative for heartburn, nausea, vomiting, abdominal pain, diarrhea and blood in stool.  Genitourinary: Negative for dysuria, urgency and frequency.  Musculoskeletal: Positive for joint pain (R. ankle).  Skin: Negative for rash.  Neurological: Positive for headaches. Negative for weakness.  Psychiatric/Behavioral: Positive for depression.      Objective  Filed Vitals:   01/11/15 0905  BP: 119/77  Pulse: 92  Temp: 98 F (36.7 C)  TempSrc: Oral  Resp: 16  Height:  (1.6 m)  Weight: 166 lb (75.297 kg)     Physical Exam  Constitutional:  She is well-developed, well-nourished, and in no distress. No distress.  HENT:  Head: Normocephalic and atraumatic.  Musculoskeletal:  R. Ankle tender to palp to soft tissue of medial ankle.  No bony abnormalities.  ROM is full.  Skin:  inflammed psoriatic area at med. R. Ankle with cracked skin and dome redness.  No cellulitis.  Vitals reviewed.     No results found for this or any previous visit (from the past 2160 hour(s)).   Assessment & Plan  1. Ankle pain, right  - meloxicam (MOBIC) 15 MG tablet; Take 1 tablet (15 mg total) by mouth daily. Take with food.  Dispense: 30 tablet; Refill: 1  2. Depression   3. Psoriasis  - Fluocinonide 0.1 % CREA; Apply thin layer of cream to rash twice a day as needed.  Dispense: 30 g; Refill: 3

## 2015-09-24 IMAGING — CR DG ANKLE 2V *R*
1 series · 2 of 2 positions shown · non-contrast
Comparison: None.

CLINICAL DATA: Nontraumatic right ankle pain and swelling for 2
weeks.

EXAM:
RIGHT ANKLE - 2 VIEW

[Series 1: ap · 0.17mm/px · 2 of 2 slices shown]
[im 1/2]
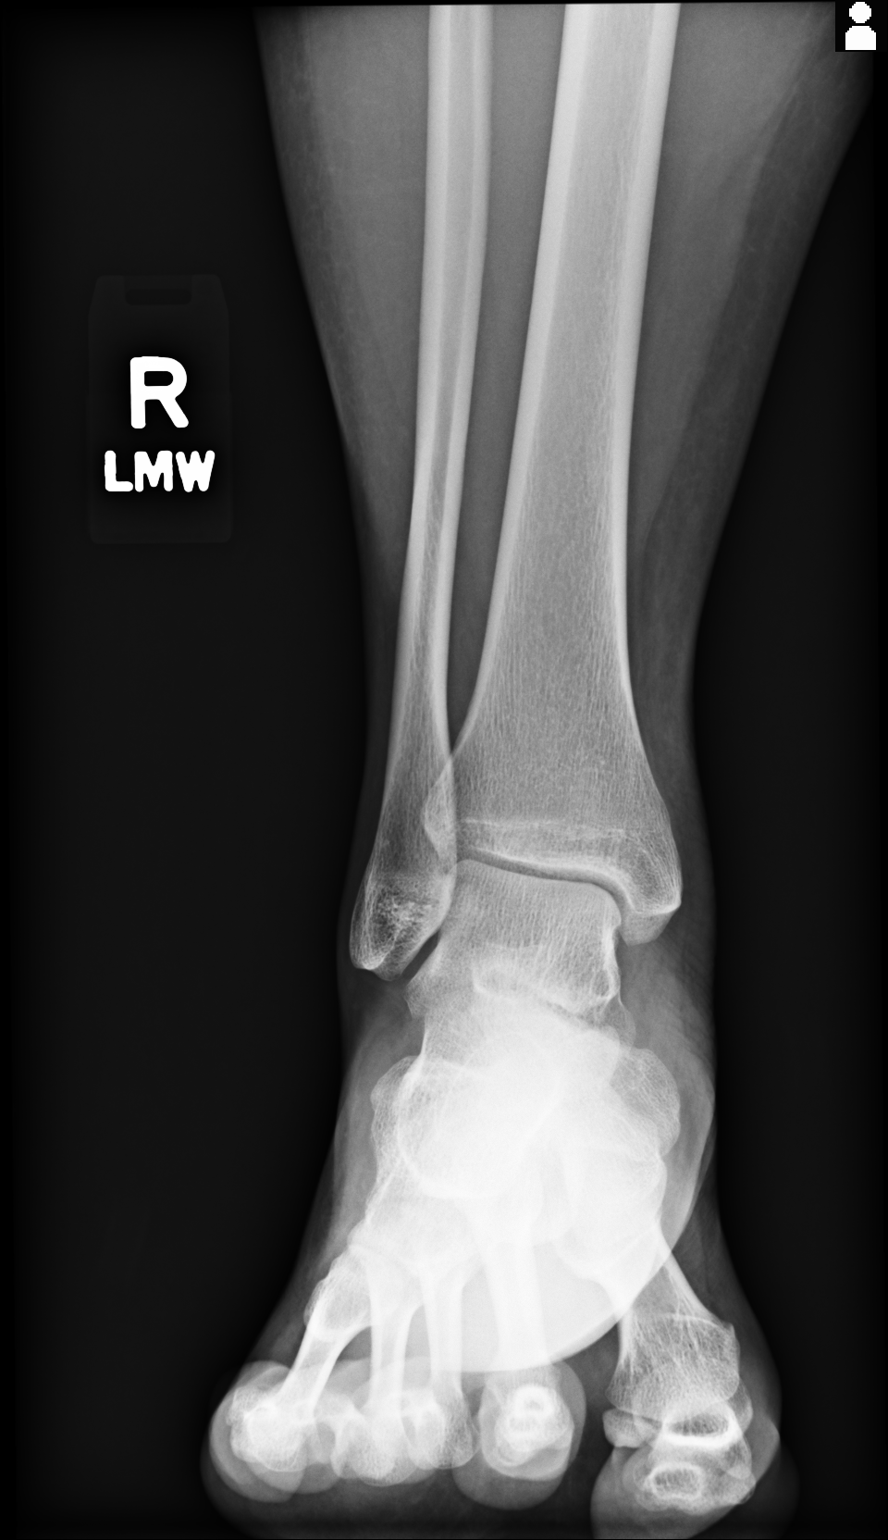
[im 2/2]
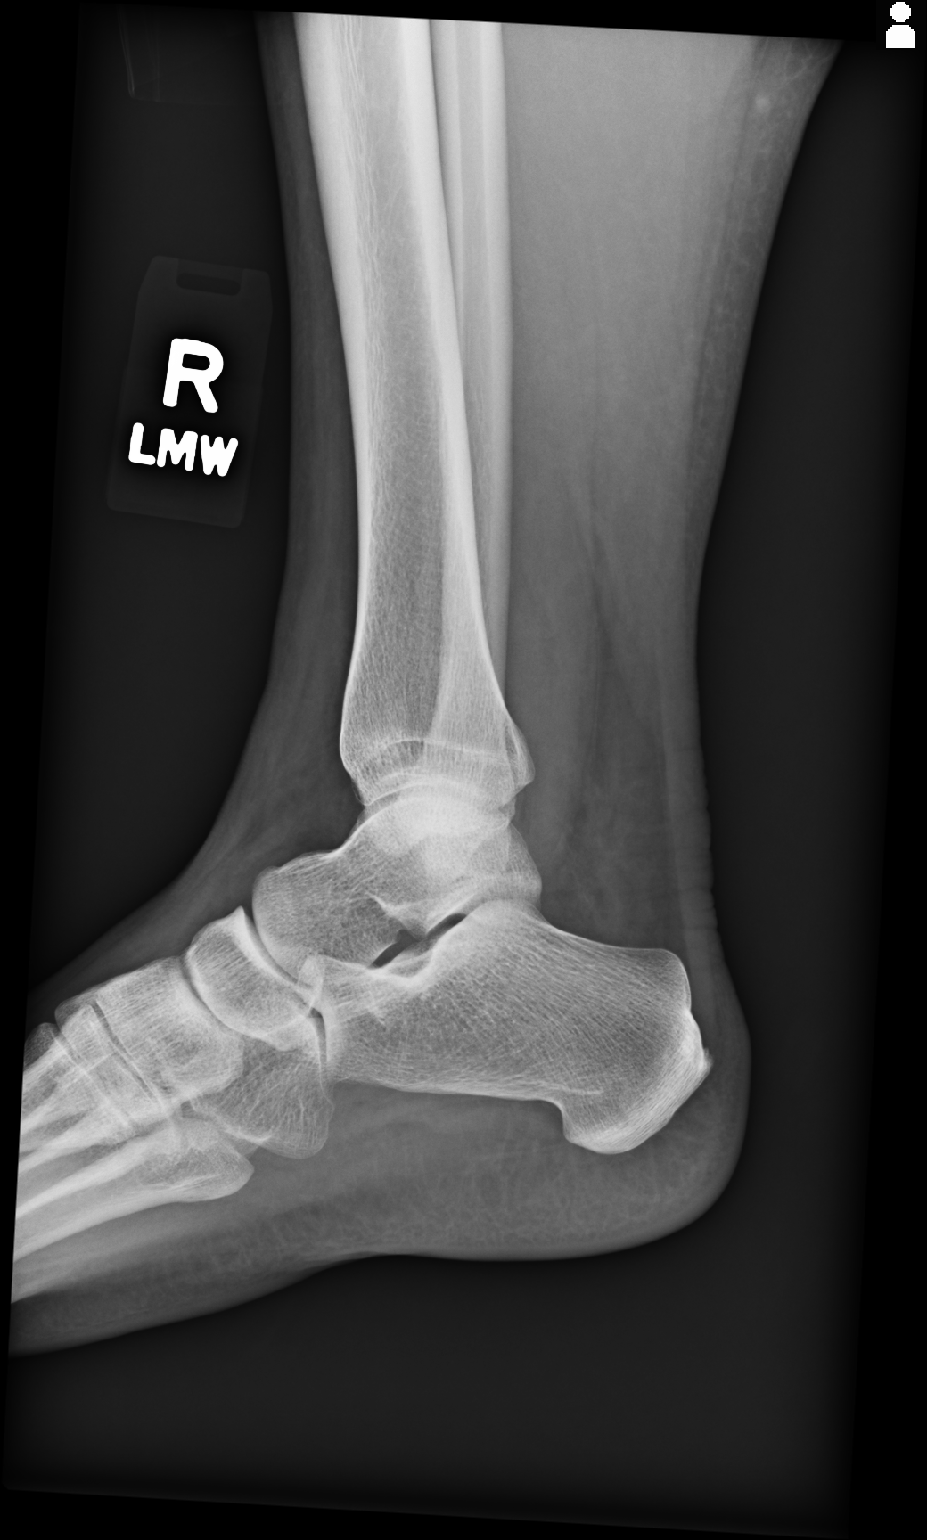

[2 of 2 positions shown; findings below may reference images not displayed]

FINDINGS: There is no evidence of fracture, dislocation, or joint effusion.
There is no evidence of arthropathy or other focal bone abnormality.
Soft tissues are unremarkable.
IMPRESSION: Negative.

## 2017-03-25 ENCOUNTER — Encounter: Payer: Self-pay | Admitting: Obstetrics and Gynecology

## 2017-03-26 ENCOUNTER — Ambulatory Visit: Payer: Commercial Managed Care - PPO | Admitting: Obstetrics and Gynecology

## 2017-03-26 ENCOUNTER — Encounter: Payer: Self-pay | Admitting: Obstetrics and Gynecology

## 2017-03-26 VITALS — BP 114/66 | HR 90 | Ht 63.0 in | Wt 178.0 lb

## 2017-03-26 DIAGNOSIS — A630 Anogenital (venereal) warts: Secondary | ICD-10-CM | POA: Insufficient documentation

## 2017-03-26 NOTE — Patient Instructions (Signed)
I value your feedback and entrusting us with your care. If you get a [facility] patient survey, I would appreciate you taking the time to let us know about your experience today. Thank you! 

## 2017-03-26 NOTE — Progress Notes (Signed)
Chief Complaint  Patient presents with  . Genital Warts    x few months    HPI:      [name] is a 31 y.o. [phone] who LMP was No LMP recorded. Patient is not currently having periods (Reason: IUD)., presents today for NP eval and treatment of ext genital warts. She has noticed lesions for the past few months. Lesions in ing area are painful after underwear rubs on them. She treats with neosporin prn. Her female partner has warty lesions as well, last treated 2015. Pt has not had warts before. No recent tx. Pt is current on pap smears and denies any hx of abn paps.  She has an IUD, due for removal 2019.    Past Medical History:  Diagnosis Date  . Anxiety   . Depression   . Headache     Past Surgical History:  Procedure Laterality Date  . CARPAL TUNNEL RELEASE Right 09/29/2014   Procedure: CARPAL TUNNEL RELEASE;  Surgeon: Myra Rudehristopher Smith, MD;  Location: [facility] ORS;  Service: Orthopedics;  Laterality: Right;  . CESAREAN SECTION    . IUD placement    . MYRINGOTOMY WITH TUBE PLACEMENT      Family History  Problem Relation Age of Onset  . Bone cancer Maternal Grandfather     Social History   Socioeconomic History  . Marital status: Single    Spouse name: Not on file  . Number of children: Not on file  . Years of education: Not on file  . Highest education level: Not on file  Social Needs  . Financial resource strain: Not on file  . Food insecurity - worry: Not on file  . Food insecurity - inability: Not on file  . Transportation needs - medical: Not on file  . Transportation needs - non-medical: Not on file  Occupational History  . Not on file  Tobacco Use  . Smoking status: Current Every Day Smoker    Packs/day: 0.25    Types: Cigarettes  . Smokeless tobacco: Never Used  Substance and Sexual Activity  . Alcohol use: Yes    Comment: Rare  . Drug use: No  . Sexual activity: Yes    Birth control/protection: IUD  Other Topics Concern  . Not on file    Social History Narrative  . Not on file     Current Outpatient Medications:  .  levonorgestrel (MIRENA) 20 MCG/24HR IUD, 1 each once by Intrauterine route., Disp: , Rfl:    ROS:  Review of Systems  Constitutional: Negative for fever.  Gastrointestinal: Negative for blood in stool, constipation, diarrhea, nausea and vomiting.  Genitourinary: Positive for genital sores. Negative for dyspareunia, dysuria, flank pain, frequency, hematuria, urgency, vaginal bleeding, vaginal discharge and vaginal pain.  Musculoskeletal: Negative for back pain.  Skin: Negative for rash.     OBJECTIVE:   Vitals:  BP 114/66 (BP Location: Left Arm, Patient Position: Sitting, Cuff Size: Normal)   Pulse 90   Ht 5\' 3"  (1.6 m)   Wt 178 lb (80.7 kg)   BMI 31.53 kg/m   Physical Exam  Constitutional: She is oriented to person, place, and time and well-developed, well-nourished, and in no distress.  Genitourinary: Vulva exhibits lesion. Vulva exhibits no erythema, no exudate, no rash and no tenderness.  Genitourinary Comments: DIFFUSE WARTY LESIONS BILAT LABIA MAJORA, MINORA, INTROITUS, ING AREAS, AND PERINEAL AREAS  Neurological: She is alert and oriented to person, place, and time.  Psychiatric: Memory, affect and judgment normal.  Vitals reviewed.    Assessment/Plan: Genital warts - Extensive lesions. Treated with TCA. Wash in 4-6 hrs. Pt tolerated well. F/u in 1 mo if need to retreat/new lesions develop. Min sex activity due to exposure.    Return if symptoms worsen or fail to improve.  Arhaan Chesnut B. Kristiana Jacko, PA-C 03/26/2017 2:52 PM

## 2017-06-16 ENCOUNTER — Encounter: Payer: Self-pay | Admitting: *Deleted

## 2017-06-16 ENCOUNTER — Other Ambulatory Visit: Payer: Self-pay

## 2017-06-16 ENCOUNTER — Ambulatory Visit
Admission: EM | Admit: 2017-06-16 | Discharge: 2017-06-16 | Disposition: A | Discharge status: Home / Self Care | Payer: Commercial Managed Care - PPO | Attending: Family Medicine | Admitting: Family Medicine

## 2017-06-16 DIAGNOSIS — K047 Periapical abscess without sinus: Secondary | ICD-10-CM | POA: Diagnosis not present

## 2017-06-16 HISTORY — DX: Unspecified perforation of tympanic membrane, left ear: H72.92

## 2017-06-16 MED ORDER — AMOXICILLIN-POT CLAVULANATE 875-125 MG PO TABS: 1.0000 | ORAL_TABLET | Freq: Two times a day (BID) | ORAL | 0 refills | Status: DC

## 2017-06-16 NOTE — ED Triage Notes (Addendum)
Patient started having left jaw and left ear pain 2 days ago. Patient has a history of left ear issues.

## 2017-06-16 NOTE — Discharge Instructions (Signed)
Your pain appears to be dental in origin.  Antibiotic as prescribed.  Ibuprofen for pain.  Take care  Dr. Adriana Simasook

## 2017-06-16 NOTE — ED Provider Notes (Signed)
[facility]    CSN: [id number] Arrival date & time: 06/16/17  0920  History   Chief Complaint Chief Complaint  Patient presents with  . Jaw Pain    HPI  32 year old female presents with pain around the ear and jaw.  Patient reports that this is been going on for the past 2 days.  She reports pain around her left ear and extending downward around the left jaw/mandible.  She is not sure whether this is secondary to an ear infection or some other entity.  No fevers or chills.  No known relieving factors.  She reports difficulty opening her mouth.  Her pain is currently 9/10 in severity.  No reports of recent upper respiratory symptoms.  No other associated symptoms.  No other complaints.  Past Medical History:  Diagnosis Date  . Anxiety   . Depression   . Eardrum rupture, left   . Headache    Patient Active Problem List   Diagnosis Date Noted  . Genital warts 03/26/2017  . Depression 01/11/2015  . Psoriasis 01/11/2015  . Ankle pain 01/11/2015   Past Surgical History:  Procedure Laterality Date  . CARPAL TUNNEL RELEASE Right 09/29/2014   Procedure: CARPAL TUNNEL RELEASE;  Surgeon: Myra Rudehristopher Smith, MD;  Location: [facility] ORS;  Service: Orthopedics;  Laterality: Right;  . CESAREAN SECTION    . IUD placement    . MYRINGOTOMY WITH TUBE PLACEMENT      OB History    Gravida Para Term Preterm AB Living   3 3 2 1   3    SAB TAB Ectopic Multiple Live Births           3     Home Medications    Prior to Admission medications   Medication Sig Start Date End Date Taking? Authorizing Provider  levonorgestrel (MIRENA) 20 MCG/24HR IUD 1 each once by Intrauterine route.   Yes [provider]  amoxicillin-clavulanate (AUGMENTIN) 875-125 MG tablet Take 1 tablet by mouth every 12 (twelve) hours. 06/16/17   Tommie Samsook, Genesia Caslin G, DO    Family History Family History  Problem Relation Age of Onset  . Bone cancer Maternal Grandfather   . Healthy Mother     Social  History Social History   Tobacco Use  . Smoking status: Current Every Day Smoker    Packs/day: 0.25    Types: Cigarettes  . Smokeless tobacco: Never Used  Substance Use Topics  . Alcohol use: Yes    Comment: Rare  . Drug use: No     Allergies   Ibuprofen   Review of Systems Review of Systems  Constitutional: Negative for chills and fever.  HENT:       Jaw pain, ear pain.   Physical Exam Triage Vital Signs ED Triage Vitals [06/16/17 0957]  Enc Vitals Group     BP 118/76     Pulse Rate 88     Resp 16     Temp 98.3 F (36.8 C)     Temp Source Oral     SpO2 100 %     Weight 170 lb (77.1 kg)     Height 5\' 3"  (1.6 m)     Head Circumference      Peak Flow      Pain Score 7     Pain Loc      Pain Edu?      Excl. in GC?    Updated Vital Signs BP 118/76 (BP Location: Left Arm)   Pulse 88  Temp 98.3 F (36.8 C) (Oral)   Resp 16   Ht 5\' 3"  (1.6 m)   Wt 170 lb (77.1 kg)   SpO2 100%   BMI 30.11 kg/m   Physical Exam  Constitutional: She is oriented to person, place, and time. She appears well-developed and well-nourished. No distress.  HENT:  Patient with difficulty opening her mouth.  Poor dentition.  Left, lower last molar with extensive carie.  Eyes: Conjunctivae are normal. Right eye exhibits no discharge. Left eye exhibits no discharge.  Neck:  Patient with tenderness around the mandible.  Lymphadenopathy noted.  Cardiovascular: Normal rate and regular rhythm.  Pulmonary/Chest: Effort normal and breath sounds normal. She has no wheezes. She has no rales.  Neurological: She is alert and oriented to person, place, and time.  Psychiatric:  Flat affect.  Depressed mood.  Nursing note and vitals reviewed.   UC Treatments / Results  Labs (all labs ordered are listed, but only abnormal results are displayed) Labs Reviewed - No data to display  EKG  EKG Interpretation None       Radiology No results found.  Procedures Procedures (including  critical care time)  Medications Ordered in UC Medications - No data to display   Initial Impression / Assessment and Plan / UC Course  I have reviewed the triage vital signs and the nursing notes.  Pertinent labs & imaging results that were available during my care of the patient were reviewed by me and considered in my medical decision making (see chart for details).     32 year old female presents with a clinical picture consistent with a dental infection.  Treated with Augmentin.  Final Clinical Impressions(s) / UC Diagnoses   Final diagnoses:  Dental infection    ED Discharge Orders        Ordered    amoxicillin-clavulanate (AUGMENTIN) 875-125 MG tablet  Every 12 hours     06/16/17 1108     Controlled Substance Prescriptions [place] Controlled Substance Registry consulted? Not Applicable   Tommie Sams, DO 06/16/17 1128

## 2017-07-05 ENCOUNTER — Encounter: Payer: Self-pay | Admitting: Emergency Medicine

## 2017-07-05 ENCOUNTER — Emergency Department
Admission: EM | Admit: 2017-07-05 | Discharge: 2017-07-05 | Disposition: A | Discharge status: Home / Self Care | Payer: Commercial Managed Care - PPO | Attending: Emergency Medicine | Admitting: Emergency Medicine

## 2017-07-05 DIAGNOSIS — Z79899 Other long term (current) drug therapy: Secondary | ICD-10-CM | POA: Insufficient documentation

## 2017-07-05 DIAGNOSIS — F17210 Nicotine dependence, cigarettes, uncomplicated: Secondary | ICD-10-CM | POA: Insufficient documentation

## 2017-07-05 DIAGNOSIS — R600 Localized edema: Secondary | ICD-10-CM | POA: Diagnosis present

## 2017-07-05 DIAGNOSIS — K047 Periapical abscess without sinus: Secondary | ICD-10-CM | POA: Diagnosis not present

## 2017-07-05 MED ORDER — HYDROCODONE-ACETAMINOPHEN 5-325 MG PO TABS
1.0000 | ORAL_TABLET | Freq: Once | ORAL | Status: AC
  Administered 2017-07-05: 1 via ORAL
  Filled 2017-07-05: qty 1

## 2017-07-05 MED ORDER — HYDROCODONE-ACETAMINOPHEN 5-325 MG PO TABS: 1.0000 | ORAL_TABLET | Freq: Four times a day (QID) | ORAL | 0 refills | Status: DC | PRN

## 2017-07-05 MED ORDER — PENICILLIN V POTASSIUM 500 MG PO TABS
500.0000 mg | ORAL_TABLET | Freq: Once | ORAL | Status: AC
  Administered 2017-07-05: 500 mg via ORAL
  Filled 2017-07-05: qty 1

## 2017-07-05 MED ORDER — PENICILLIN V POTASSIUM 500 MG PO TABS: 500.0000 mg | ORAL_TABLET | Freq: Four times a day (QID) | ORAL | 0 refills | Status: AC

## 2017-07-05 NOTE — Discharge Instructions (Addendum)
Please take all of your antibiotics as prescribed and most critically follow-up with the dentist tomorrow for reevaluation.  Return to the emergency department tonight should he develop any new or worsening symptoms such as drooling, shortness of breath, or for any other issues whatsoever.

## 2017-07-05 NOTE — ED Triage Notes (Signed)
Patient reports that she started having swelling to the right side of her face. Patient reports that the swelling has become worse throughout the day today. Patient states that she had a fever of 102 at home.

## 2017-07-05 NOTE — ED Provider Notes (Signed)
[facility] Emergency Department Provider Note  ____________________________________________   First MD Initiated Contact with Patient 07/05/17 2300     (approximate)  I have reviewed the triage vital signs and the nursing notes.   HISTORY  Chief Complaint Dental Pain and Abscess   HPI Savannah [name] is a 32 y.o. female who self presents the emergency department with roughly 24 hours of moderate to severe painful swelling to the left side of her face.  Associated with fever to 102 degrees at home.  Prior to coming to the emergency department she took naproxen.  She has not seen a dentist in "years".  Her symptoms began gradually and have been slowly progressive.  Her pain is nonradiating.  She has no drooling.  She is able to eat and drink.  No shortness of breath.  No ear pain.  Her symptoms were improved with naproxen and nothing in particular seems to make them worse.  Past Medical History:  Diagnosis Date  . Anxiety   . Depression   . Eardrum rupture, left   . Headache     Patient Active Problem List   Diagnosis Date Noted  . Genital warts 03/26/2017  . Depression 01/11/2015  . Psoriasis 01/11/2015  . Ankle pain 01/11/2015    Past Surgical History:  Procedure Laterality Date  . CARPAL TUNNEL RELEASE Right 09/29/2014   Procedure: CARPAL TUNNEL RELEASE;  Surgeon: Myra Rude, MD;  Location: [facility] ORS;  Service: Orthopedics;  Laterality: Right;  . CESAREAN SECTION    . IUD placement    . MYRINGOTOMY WITH TUBE PLACEMENT      Prior to Admission medications   Medication Sig Start Date End Date Taking? Authorizing Provider  amoxicillin-clavulanate (AUGMENTIN) 875-125 MG tablet Take 1 tablet by mouth every 12 (twelve) hours. 06/16/17   Tommie Sams, DO  HYDROcodone-acetaminophen (NORCO) 5-325 MG tablet Take 1 tablet by mouth every 6 (six) hours as needed for up to 15 doses for severe pain. 07/05/17   Merrily Brittle, MD  levonorgestrel (MIRENA)  20 MCG/24HR IUD 1 each once by Intrauterine route.    [provider]  penicillin v potassium (VEETID) 500 MG tablet Take 1 tablet (500 mg total) by mouth 4 (four) times daily for 10 days. 07/05/17 07/15/17  Merrily Brittle, MD    Allergies Ibuprofen  Family History  Problem Relation Age of Onset  . Bone cancer Maternal Grandfather   . Healthy Mother     Social History Social History   Tobacco Use  . Smoking status: Current Every Day Smoker    Packs/day: 0.25    Types: Cigarettes  . Smokeless tobacco: Never Used  Substance Use Topics  . Alcohol use: Yes    Comment: Rare  . Drug use: No    Review of Systems Constitutional: Positive for fever Eyes: No visual changes. ENT: Positive for sore throat. Cardiovascular: Denies chest pain. Respiratory: Denies shortness of breath. Gastrointestinal: No abdominal pain.  No nausea, no vomiting.  No diarrhea.  No constipation. Genitourinary: Negative for dysuria. Musculoskeletal: Negative for back pain. Skin: Negative for rash. Neurological: Negative for headaches, focal weakness or numbness.   ____________________________________________   PHYSICAL EXAM:  VITAL SIGNS: ED Triage Vitals [07/05/17 2139]  Enc Vitals Group     BP 104/67     Pulse Rate (!) 102     Resp 18     Temp 98.6 F (37 C)     Temp Source Oral     SpO2 98 %  Weight 170 lb (77.1 kg)     Height 5\' 3"  (1.6 m)     Head Circumference      Peak Flow      Pain Score 5     Pain Loc      Pain Edu?      Excl. in GC?     Constitutional: Alert and oriented x4 speaks in full clear sentences somewhat anxious appearing Eyes: PERRL EOMI. Head: Atraumatic. Nose: No congestion/rhinnorhea. Mouth/Throat: Mild trismus she has some tenderness left dental on the bottom.  She also has submandibular swelling on the left.  Normal oropharynx otherwise Neck: No stridor.  No meningismus Cardiovascular: Tachycardic rate, regular rhythm. Grossly normal heart  sounds.  Good peripheral circulation. Respiratory: Normal respiratory effort.  No retractions. Lungs CTAB and moving good air Gastrointestinal: Soft nontender Musculoskeletal: No lower extremity edema   Neurologic:  Normal speech and language. No gross focal neurologic deficits are appreciated. Skin:  Skin is warm, dry and intact. No rash noted. Psychiatric: Mildly anxious appearing.    ____________________________________________   DIFFERENTIAL includes but not limited to  Dental abscess, submandibular abscess, canine space abscess, retropharyngeal abscess, Ludwig's angina ____________________________________________   LABS (all labs ordered are listed, but only abnormal results are displayed)  Labs Reviewed - No data to display   __________________________________________  EKG   ____________________________________________  RADIOLOGY   ____________________________________________   PROCEDURES  Procedure(s) performed: no  Procedures  Critical Care performed: no  Observation: no ____________________________________________   INITIAL IMPRESSION / ASSESSMENT AND PLAN / ED COURSE  Pertinent labs & imaging results that were available during my care of the patient were reviewed by me and considered in my medical decision making (see chart for details).  The patient has an obvious left-sided dental abscess with likely submandibular abscess.  She has no sublingual or submental fullness.  No evidence of posterior infection.  No evidence of Ludwig's angina.  She is protecting her airway and is able to swallow.  It is currently midnight and she says she is able to see a dentist first thing in the morning.  No indication for emergent consultation at this time.  As it is late I will give her first dose of penicillin VK now and encouraged her to keep that follow-up.  Strict return precautions have been given and the patient verbalized understanding and agreement with the  plan.      ____________________________________________   FINAL CLINICAL IMPRESSION(S) / ED DIAGNOSES  Final diagnoses:  Dental abscess      NEW MEDICATIONS STARTED DURING THIS VISIT:  Discharge Medication List as of 07/05/2017 11:10 PM    START taking these medications   Details  HYDROcodone-acetaminophen (NORCO) 5-325 MG tablet Take 1 tablet by mouth every 6 (six) hours as needed for up to 15 doses for severe pain., Starting Sun 07/05/2017, Print    penicillin v potassium (VEETID) 500 MG tablet Take 1 tablet (500 mg total) by mouth 4 (four) times daily for 10 days., Starting Sun 07/05/2017, Until Wed 07/15/2017, Print         Note:  This document was prepared using Dragon voice recognition software and may include unintentional dictation errors.      Merrily Brittleifenbark, Siniyah Evangelist, MD 07/06/17 2202

## 2018-06-01 ENCOUNTER — Encounter: Payer: Self-pay | Admitting: Nurse Practitioner

## 2018-06-01 ENCOUNTER — Ambulatory Visit: Payer: Commercial Managed Care - PPO | Admitting: Nurse Practitioner

## 2018-06-01 VITALS — BP 107/64 | HR 87 | Resp 16 | Ht 63.0 in | Wt 177.4 lb

## 2018-06-01 DIAGNOSIS — K582 Mixed irritable bowel syndrome: Secondary | ICD-10-CM

## 2018-06-01 DIAGNOSIS — L738 Other specified follicular disorders: Secondary | ICD-10-CM

## 2018-06-01 DIAGNOSIS — L409 Psoriasis, unspecified: Secondary | ICD-10-CM | POA: Diagnosis not present

## 2018-06-01 DIAGNOSIS — G5600 Carpal tunnel syndrome, unspecified upper limb: Secondary | ICD-10-CM | POA: Insufficient documentation

## 2018-06-01 MED ORDER — METHYLPREDNISOLONE 4 MG PO TBPK: ORAL_TABLET | ORAL | 0 refills | Status: DC

## 2018-06-01 MED ORDER — SULFAMETHOXAZOLE-TRIMETHOPRIM 800-160 MG PO TABS: 1.0000 | ORAL_TABLET | Freq: Two times a day (BID) | ORAL | 0 refills | Status: DC

## 2018-06-01 NOTE — Progress Notes (Signed)
[facility] [address] [place], [place] [address]  Internal MEDICINE  Office Visit Note  Patient Name: Savannah Booksshley Vonruden  [id number]-Nov-1987  [id number]  Date of Service: 06/01/2018   Complaints/HPI Pt is here for establishment of PCP. Chief Complaint  Patient presents with  . New Patient (Initial Visit)  . Rash    pt has a rash on her body that started about a month ago and has spread all over the body, it itches, burns and feels like sand paper   The patient is here to establish primary care provider. She is complaining of rash. Started as small area on the left wrist and thumb. Has gradually spread up both arms, on her chest, back, buttocks, and legs. Rash is very itchy and can feel like sandpaper is rubbing on her skin. Does have history of psoriasis. Started eleven years ago during her pregnancy. She has seen dermatologist for this. Has steroid cream for psoriatic lesions. Tried using her steroid cream on the rash. Did not help, really just made the rash worse.  Today, she has also noted what looks like blood in the left ear. She states that there is some pressure in the ear but does not hurt. When she was a kid, she reptured the left ear drum, so for her to have earache in that ear is not unusual.  Pap smear is up to date. [facility]/2017 with normal results.    Current Medication: Outpatient Encounter Medications as of 06/01/2018  Medication Sig  . levonorgestrel (MIRENA) 20 MCG/24HR IUD 1 each once by Intrauterine route.  . Multiple Vitamins-Minerals (MULTIVITAMIN ADULT PO) Take by mouth.  . methylPREDNISolone (MEDROL) 4 MG TBPK tablet Take by mouth as directed for 6 days  . sulfamethoxazole-trimethoprim (BACTRIM DS,SEPTRA DS) 800-160 MG tablet Take 1 tablet by mouth 2 (two) times daily.  . [DISCONTINUED] amoxicillin-clavulanate (AUGMENTIN) 875-125 MG tablet Take 1 tablet by mouth every 12 (twelve) hours. (Patient not taking: Reported on 06/01/2018)  .  [DISCONTINUED] HYDROcodone-acetaminophen (NORCO) 5-325 MG tablet Take 1 tablet by mouth every 6 (six) hours as needed for up to 15 doses for severe pain. (Patient not taking: Reported on 06/01/2018)   No facility-administered encounter medications on file as of 06/01/2018.     Surgical History: Past Surgical History:  Procedure Laterality Date  . CARPAL TUNNEL RELEASE Right 09/29/2014   Procedure: CARPAL TUNNEL RELEASE;  Surgeon: Myra Rudehristopher Smith, MD;  Location: [facility] ORS;  Service: Orthopedics;  Laterality: Right;  . CESAREAN SECTION    . IUD placement    . MYRINGOTOMY WITH TUBE PLACEMENT      Medical History: Past Medical History:  Diagnosis Date  . Anxiety   . Depression   . Eardrum rupture, left   . Headache     Family History: Family History  Problem Relation Age of Onset  . Bone cancer Maternal Grandfather   . Healthy Mother     Social History   Socioeconomic History  . Marital status: Married    Spouse name: Not on file  . Number of children: Not on file  . Years of education: Not on file  . Highest education level: Not on file  Occupational History  . Not on file  Social Needs  . Financial resource strain: Not on file  . Food insecurity:    Worry: Not on file    Inability: Not on file  . Transportation needs:    Medical: Not on file    Non-medical:  Not on file  Tobacco Use  . Smoking status: Current Some Day Smoker    Packs/day: 0.25    Types: Cigarettes  . Smokeless tobacco: Never Used  Substance and Sexual Activity  . Alcohol use: Not Currently  . Drug use: No  . Sexual activity: Yes    Birth control/protection: I.U.D.  Lifestyle  . Physical activity:    Days per week: 0 days    Minutes per session: 0 min  . Stress: To some extent  Relationships  . Social connections:    Talks on phone: Three times a week    Gets together: Once a week    Attends religious service: Never    Active member of club or organization: No    Attends meetings of  clubs or organizations: Never    Relationship status: Living with partner  . Intimate partner violence:    Fear of current or ex partner: No    Emotionally abused: No    Physically abused: No    Forced sexual activity: No  Other Topics Concern  . Not on file  Social History Narrative  . Not on file     Review of Systems  Constitutional: Negative for chills, fatigue and unexpected weight change.  HENT: Positive for postnasal drip. Negative for congestion, rhinorrhea, sneezing and sore throat.   Eyes: Negative for redness.  Respiratory: Negative for cough, chest tightness and shortness of breath.   Cardiovascular: Negative for chest pain and palpitations.  Gastrointestinal: Negative for abdominal pain, constipation, diarrhea, nausea and vomiting.       States that she can go several days without having a bowel movement. Alternates with episodes of diarrhea.   Endocrine: Negative for cold intolerance, heat intolerance, polydipsia and polyuria.  Genitourinary: Negative for dysuria and frequency.  Musculoskeletal: Negative for arthralgias, back pain, joint swelling and neck pain.  Skin: Positive for rash.       Red, inflamed, and very itchy rash. Started on the left wrist and hand and has gradually spread all over the body.   Allergic/Immunologic: Negative for environmental allergies.  Neurological: Negative for dizziness, tremors, numbness and headaches.  Hematological: Negative for adenopathy. Does not bruise/bleed easily.  Psychiatric/Behavioral: Negative for behavioral problems (Depression), sleep disturbance and suicidal ideas. The patient is nervous/anxious.     Today's Vitals   06/01/18 1118  BP: 107/64  Pulse: 87  Resp: 16  SpO2: 97%  Weight: 177 lb 6.4 oz (80.5 kg)  Height: 5\' 3"  (1.6 m)    Physical Exam Vitals signs and nursing note reviewed.  Constitutional:      General: She is not in acute distress.    Appearance: Normal appearance. She is well-developed. She is  not diaphoretic.  HENT:     Head: Normocephalic and atraumatic.     Right Ear: Ear canal normal.     Left Ear: Ear canal normal.     Ears:     Comments: Eczematous skin around the outer ear and external ear canals, bilaterally.     Nose: Nose normal.     Mouth/Throat:     Pharynx: No oropharyngeal exudate.  Eyes:     Pupils: Pupils are equal, round, and reactive to light.  Neck:     Musculoskeletal: Normal range of motion and neck supple.     Thyroid: No thyromegaly.     Vascular: No JVD.     Trachea: No tracheal deviation.  Cardiovascular:     Rate and Rhythm: Normal rate and regular  rhythm.     Heart sounds: Normal heart sounds. No murmur. No friction rub. No gallop.   Pulmonary:     Effort: Pulmonary effort is normal. No respiratory distress.     Breath sounds: Normal breath sounds. No wheezing or rales.  Chest:     Chest wall: No tenderness.  Abdominal:     General: Bowel sounds are normal.     Palpations: Abdomen is soft.  Musculoskeletal: Normal range of motion.  Lymphadenopathy:     Cervical: No cervical adenopathy.  Skin:    General: Skin is warm and dry.     Capillary Refill: Capillary refill takes less than 2 seconds.     Findings: Rash present.     Comments: There is red, erythematous rash,, widely spread over the body. There are clusters of these lesions around the wrists and forearms and on the abdomen. Lesions are very itchy and in varius stages of healing. There is also psoriatic type rash on the outer, right lower leg.   Neurological:     Mental Status: She is alert and oriented to person, place, and time.     Cranial Nerves: No cranial nerve deficit.  Psychiatric:        Behavior: Behavior normal.        Thought Content: Thought content normal.        Judgment: Judgment normal.   Assessment/Plan: 1. Bacterial folliculitis Start bactrim DS bid for 10 days. Add medrol dose pack. Take as directed for 6 days. Recommend use of previously prescribed  triamcinolone cream. Will see back in two weeks and refer to dermatology as indicated.  - sulfamethoxazole-trimethoprim (BACTRIM DS,SEPTRA DS) 800-160 MG tablet; Take 1 tablet by mouth 2 (two) times daily.  Dispense: 20 tablet; Refill: 0 - methylPREDNISolone (MEDROL) 4 MG TBPK tablet; Take by mouth as directed for 6 days  Dispense: 21 tablet; Refill: 0  2. Psoriasis Recommend use of eucrisa twice daily on skin of outer ear and outer ear canals. Samples provided today.   3. Irritable bowel syndrome with both constipation and diarrhea Possibly related to anxiety. Recommend she keep food journal. Will discuss in more detail at next visit.   General Counseling: lilamae sobh understanding of the findings of todays visit and agrees with plan of treatment. I have discussed any further diagnostic evaluation that may be needed or ordered today. We also reviewed her medications today. she has been encouraged to call the office with any questions or concerns that should arise related to todays visit.    Counseling:  Lab slip given to check routine, fasting labs .  Meds ordered this encounter  Medications  . sulfamethoxazole-trimethoprim (BACTRIM DS,SEPTRA DS) 800-160 MG tablet    Sig: Take 1 tablet by mouth 2 (two) times daily.    Dispense:  20 tablet    Refill:  0    Order Specific Question:   Supervising Provider    Answer:   Lyndon Code [[id number]]  . methylPREDNISolone (MEDROL) 4 MG TBPK tablet    Sig: Take by mouth as directed for 6 days    Dispense:  21 tablet    Refill:  0    Order Specific Question:   Supervising Provider    Answer:   Lyndon Code [[id number]]    Time spent: 30 Minutes

## 2018-06-08 ENCOUNTER — Other Ambulatory Visit: Payer: Self-pay

## 2018-06-08 ENCOUNTER — Emergency Department
Admission: EM | Admit: 2018-06-08 | Discharge: 2018-06-08 | Disposition: A | Discharge status: Home / Self Care | Payer: Commercial Managed Care - PPO | Attending: Student in an Organized Health Care Education/Training Program | Admitting: Student in an Organized Health Care Education/Training Program

## 2018-06-08 ENCOUNTER — Encounter: Payer: Self-pay | Admitting: Emergency Medicine

## 2018-06-08 DIAGNOSIS — Z79899 Other long term (current) drug therapy: Secondary | ICD-10-CM | POA: Diagnosis not present

## 2018-06-08 DIAGNOSIS — R42 Dizziness and giddiness: Secondary | ICD-10-CM | POA: Diagnosis not present

## 2018-06-08 DIAGNOSIS — T50905A Adverse effect of unspecified drugs, medicaments and biological substances, initial encounter: Secondary | ICD-10-CM | POA: Diagnosis not present

## 2018-06-08 DIAGNOSIS — F329 Major depressive disorder, single episode, unspecified: Secondary | ICD-10-CM | POA: Insufficient documentation

## 2018-06-08 DIAGNOSIS — F17210 Nicotine dependence, cigarettes, uncomplicated: Secondary | ICD-10-CM | POA: Insufficient documentation

## 2018-06-08 DIAGNOSIS — F419 Anxiety disorder, unspecified: Secondary | ICD-10-CM | POA: Diagnosis not present

## 2018-06-08 DIAGNOSIS — T450X5A Adverse effect of antiallergic and antiemetic drugs, initial encounter: Secondary | ICD-10-CM | POA: Diagnosis not present

## 2018-06-08 MED ORDER — MECLIZINE HCL 25 MG PO TABS: 25.0000 mg | ORAL_TABLET | Freq: Three times a day (TID) | ORAL | 0 refills | Status: DC | PRN

## 2018-06-08 MED ORDER — MECLIZINE HCL 25 MG PO TABS
25.0000 mg | ORAL_TABLET | Freq: Once | ORAL | Status: AC
  Administered 2018-06-08: 25 mg via ORAL
  Filled 2018-06-08: qty 1

## 2018-06-08 NOTE — ED Triage Notes (Addendum)
Pt arrived to the ED accompanied by her significant other for complaints of dizziness. Pt reports that she was getting ready for work and began to feel dizzy "like with was falling to her right side." Pt reports that she's had a cough and runny nose the last 6 days and that she has a history of recurrent ear infections with ear tubes placed. Pt is AOx4 in no apparent distress with no obvious neurological deficiencies noticed during triage.

## 2018-06-08 NOTE — Discharge Instructions (Addendum)
Follow-up with your regular doctor if not better in 3 days.  Stop taking the Bactrim/Septra because this medication can cause dizziness.  Drink plenty of water.  Take the Antivert as prescribed.  Return emergency department worsening.

## 2018-06-08 NOTE — ED Notes (Signed)
Patient states she feels anxious.

## 2018-06-08 NOTE — ED Provider Notes (Signed)
[facility] Emergency Department Provider Note  ____________________________________________   First MD Initiated Contact with Patient 06/08/18 2137     (approximate)  I have reviewed the triage vital signs and the nursing notes.   HISTORY  Chief Complaint Dizziness    HPI Savannah [name] is a 33 y.o. female presents emergency department complaining of dizziness today.  She is been taking prednisone which she finished on Sunday and Bactrim for a rash on her skin.  She states that she almost fell down due to the dizziness.  She states she has been drinking water.  She denies any chest pain or shortness of breath.    Past Medical History:  Diagnosis Date  . Anxiety   . Depression   . Eardrum rupture, left   . Headache     Patient Active Problem List   Diagnosis Date Noted  . Carpal tunnel syndrome 06/01/2018  . Bacterial folliculitis 06/01/2018  . Irritable bowel syndrome with both constipation and diarrhea 06/01/2018  . Genital warts 03/26/2017  . Depression 01/11/2015  . Psoriasis 01/11/2015  . Ankle pain 01/11/2015    Past Surgical History:  Procedure Laterality Date  . CARPAL TUNNEL RELEASE Right 09/29/2014   Procedure: CARPAL TUNNEL RELEASE;  Surgeon: Myra Rudehristopher Smith, MD;  Location: [facility] ORS;  Service: Orthopedics;  Laterality: Right;  . CESAREAN SECTION    . IUD placement    . MYRINGOTOMY WITH TUBE PLACEMENT      Prior to Admission medications   Medication Sig Start Date End Date Taking? Authorizing Provider  levonorgestrel (MIRENA) 20 MCG/24HR IUD 1 each once by Intrauterine route.    [provider]  meclizine (ANTIVERT) 25 MG tablet Take 1 tablet (25 mg total) by mouth 3 (three) times daily as needed for dizziness. 06/08/18   Farrell Pantaleo, Roselyn BeringSusan W, PA-C  methylPREDNISolone (MEDROL) 4 MG TBPK tablet Take by mouth as directed for 6 days 06/01/18   Carlean JewsBoscia, Heather E, NP  Multiple Vitamins-Minerals (MULTIVITAMIN ADULT PO) Take by  mouth.    [provider]  sulfamethoxazole-trimethoprim (BACTRIM DS,SEPTRA DS) 800-160 MG tablet Take 1 tablet by mouth 2 (two) times daily. 06/01/18   Carlean JewsBoscia, Heather E, NP    Allergies Ibuprofen  Family History  Problem Relation Age of Onset  . Bone cancer Maternal Grandfather   . Healthy Mother     Social History Social History   Tobacco Use  . Smoking status: Current Some Day Smoker    Packs/day: 0.25    Types: Cigarettes  . Smokeless tobacco: Never Used  Substance Use Topics  . Alcohol use: Not Currently  . Drug use: No    Review of Systems  Constitutional: No fever/chills, positive for dizziness Eyes: No visual changes. ENT: No sore throat. Respiratory: Denies cough Genitourinary: Negative for dysuria. Musculoskeletal: Negative for back pain. Skin: Negative for rash.    ____________________________________________   PHYSICAL EXAM:  VITAL SIGNS: ED Triage Vitals  Enc Vitals Group     BP 06/08/18 2126 112/71     Pulse Rate 06/08/18 2126 87     Resp 06/08/18 2126 16     Temp 06/08/18 2126 98 F (36.7 C)     Temp Source 06/08/18 2126 Oral     SpO2 06/08/18 2126 97 %     Weight 06/08/18 2127 177 lb (80.3 kg)     Height 06/08/18 2127 5\' 3"  (1.6 m)     Head Circumference --      Peak Flow --  Pain Score 06/08/18 2126 0     Pain Loc --      Pain Edu? --      Excl. in GC? --     Constitutional: Alert and oriented. Well appearing and in no acute distress. Eyes: Conjunctivae are normal.  Positive for 6 beats of nystagmus bilaterally. Head: Atraumatic. Nose: No congestion/rhinnorhea. Mouth/Throat: Mucous membranes are moist.   Neck:  supple no lymphadenopathy noted Cardiovascular: Normal rate, regular rhythm. Heart sounds are normal Respiratory: Normal respiratory effort.  No retractions, lungs c t a  GU: deferred Musculoskeletal: FROM all extremities, warm and well perfused Neurologic:  Normal speech and language.  Cranial nerves II  through XII grossly intact Skin:  Skin is warm, dry and intact. No rash noted. Psychiatric: Mood and affect are normal. Speech and behavior are normal.  ____________________________________________   LABS (all labs ordered are listed, but only abnormal results are displayed)  Labs Reviewed - No data to display ____________________________________________   ____________________________________________  RADIOLOGY    ____________________________________________   PROCEDURES  Procedure(s) performed: Antivert 25 mg p.o. EKG shows normal sinus rhythm Procedures    ____________________________________________   INITIAL IMPRESSION / ASSESSMENT AND PLAN / ED COURSE  Pertinent labs & imaging results that were available during my care of the patient were reviewed by me and considered in my medical decision making (see chart for details).   Patient is 33 year old female presents emergency department complaint of dizziness.   Physical exam patient appears very well.  She has some nystagmus noted.  Motor exam is unremarkable  Explained to the patient has vertigo which is most likely secondary to the Bactrim she has been taking.  Dizziness is a side effect of the medication.  She was given a prescription for Antivert.  She is to drink plenty of fluids.  Return emergency department worsening.  States she understands will comply.  She is discharged stable condition.  As part of my medical decision making, I reviewed the following data within the electronic MEDICAL RECORD NUMBER Nursing notes reviewed and incorporated, EKG interpreted NSR, Old chart reviewed, Notes from prior ED visits and [place] Controlled Substance Database  ____________________________________________   FINAL CLINICAL IMPRESSION(S) / ED DIAGNOSES  Final diagnoses:  Vertigo  Medication reaction, initial encounter      NEW MEDICATIONS STARTED DURING THIS VISIT:  New Prescriptions   MECLIZINE (ANTIVERT) 25 MG TABLET     Take 1 tablet (25 mg total) by mouth 3 (three) times daily as needed for dizziness.     Note:  This document was prepared using Dragon voice recognition software and may include unintentional dictation errors.    Faythe Ghee, PA-C 06/09/18 0003    Willy Eddy, MD 06/11/18 [phone]

## 2018-06-11 ENCOUNTER — Other Ambulatory Visit: Payer: Self-pay | Admitting: Nurse Practitioner

## 2018-06-11 DIAGNOSIS — Z0001 Encounter for general adult medical examination with abnormal findings: Secondary | ICD-10-CM | POA: Diagnosis not present

## 2018-06-11 DIAGNOSIS — E559 Vitamin D deficiency, unspecified: Secondary | ICD-10-CM | POA: Diagnosis not present

## 2018-06-11 DIAGNOSIS — L738 Other specified follicular disorders: Secondary | ICD-10-CM | POA: Diagnosis not present

## 2018-06-12 LAB — CBC
Hematocrit: 41.1 % (ref 34.0–46.6)
Hemoglobin: 14.2 g/dL (ref 11.1–15.9)
MCH: 31.2 pg (ref 26.6–33.0)
MCHC: 34.5 g/dL (ref 31.5–35.7)
MCV: 90 fL (ref 79–97)
Platelets: 264 10*3/uL (ref 150–450)
RBC: 4.55 x10E6/uL (ref 3.77–5.28)
RDW: 12.3 % (ref 11.7–15.4)
WBC: 9 10*3/uL (ref 3.4–10.8)

## 2018-06-12 LAB — COMPREHENSIVE METABOLIC PANEL
ALBUMIN: 4.3 g/dL (ref 3.8–4.8)
ALT: 19 IU/L (ref 0–32)
AST: 16 IU/L (ref 0–40)
Albumin/Globulin Ratio: 2 (ref 1.2–2.2)
Alkaline Phosphatase: 66 IU/L (ref 39–117)
BUN / CREAT RATIO: 23 (ref 9–23)
BUN: 17 mg/dL (ref 6–20)
Bilirubin Total: 0.4 mg/dL (ref 0.0–1.2)
CO2: 24 mmol/L (ref 20–29)
Calcium: 9.3 mg/dL (ref 8.7–10.2)
Chloride: 101 mmol/L (ref 96–106)
Creatinine, Ser: 0.75 mg/dL (ref 0.57–1.00)
GFR calc non Af Amer: 107 mL/min/{1.73_m2} (ref >59)
GFR, EST AFRICAN AMERICAN: 123 mL/min/{1.73_m2} (ref >59)
Globulin, Total: 2.2 g/dL (ref 1.5–4.5)
Glucose: 75 mg/dL (ref 65–99)
Potassium: 4.1 mmol/L (ref 3.5–5.2)
Sodium: 140 mmol/L (ref 134–144)
Total Protein: 6.5 g/dL (ref 6.0–8.5)

## 2018-06-12 LAB — LIPID PANEL W/O CHOL/HDL RATIO
Cholesterol, Total: 166 mg/dL (ref 100–199)
HDL: 25 mg/dL — ABNORMAL LOW (ref >39)
LDL Calculated: 104 mg/dL — ABNORMAL HIGH (ref 0–99)
Triglycerides: 183 mg/dL — ABNORMAL HIGH (ref 0–149)
VLDL Cholesterol Cal: 37 mg/dL (ref 5–40)

## 2018-06-12 LAB — T3: T3 TOTAL: 120 ng/dL (ref 71–180)

## 2018-06-12 LAB — TSH: TSH: 2.64 u[IU]/mL (ref 0.450–4.500)

## 2018-06-12 LAB — T4, FREE: Free T4: 0.95 ng/dL (ref 0.82–1.77)

## 2018-06-12 LAB — VITAMIN D 25 HYDROXY (VIT D DEFICIENCY, FRACTURES): Vit D, 25-Hydroxy: 16.4 ng/mL — ABNORMAL LOW (ref 30.0–100.0)

## 2018-06-18 ENCOUNTER — Ambulatory Visit: Payer: Commercial Managed Care - PPO | Admitting: Nurse Practitioner

## 2018-06-18 VITALS — BP 108/72 | HR 93 | Resp 16 | Ht 63.0 in | Wt 178.0 lb

## 2018-06-18 DIAGNOSIS — L209 Atopic dermatitis, unspecified: Secondary | ICD-10-CM

## 2018-06-18 DIAGNOSIS — K219 Gastro-esophageal reflux disease without esophagitis: Secondary | ICD-10-CM | POA: Diagnosis not present

## 2018-06-18 DIAGNOSIS — L738 Other specified follicular disorders: Secondary | ICD-10-CM

## 2018-06-18 DIAGNOSIS — E559 Vitamin D deficiency, unspecified: Secondary | ICD-10-CM

## 2018-06-18 MED ORDER — METHYLPREDNISOLONE 4 MG PO TBPK: ORAL_TABLET | ORAL | 0 refills | Status: DC

## 2018-06-18 MED ORDER — TRIAMCINOLONE ACETONIDE 0.025 % EX CREA: 1.0000 "application " | TOPICAL_CREAM | Freq: Two times a day (BID) | CUTANEOUS | 2 refills | Status: DC

## 2018-06-18 MED ORDER — ERGOCALCIFEROL 1.25 MG (50000 UT) PO CAPS: 50000.0000 [IU] | ORAL_CAPSULE | ORAL | 5 refills | Status: DC

## 2018-06-18 MED ORDER — OMEPRAZOLE 20 MG PO CPDR: 20.0000 mg | DELAYED_RELEASE_CAPSULE | Freq: Every day | ORAL | 3 refills | Status: DC

## 2018-06-18 MED ORDER — CLOTRIMAZOLE-BETAMETHASONE 1-0.05 % EX CREA: 1.0000 "application " | TOPICAL_CREAM | Freq: Two times a day (BID) | CUTANEOUS | 2 refills | Status: DC

## 2018-06-18 NOTE — Progress Notes (Signed)
[facility] [address] [place], [place] [address]  Internal MEDICINE  Office Visit Note  Patient Name: Savannah [name]  [id number]  [id number]  Date of Service: 08/17/2018  Chief Complaint  Patient presents with  . Rash    2wk follow up  . Medical Management of Chronic Issues    pt was diagnosed with virtego recently had and ER visit the medication that they gave her she cant take it due to making her very drowsy, they told her to stop taking antibiotic that she was on due to it making the virtego worse    She is complaining of rash. Started as small area on the left wrist and thumb. Has gradually spread up both arms, on her chest, back, buttocks, and legs. Rash is very itchy and can feel like sandpaper is rubbing on her skin. Does have history of psoriasis. Started eleven years ago during her pregnancy. She has seen dermatologist for this. Has steroid cream for psoriatic lesions. Tried using her steroid cream on the rash. Did not help, really just made the rash worse. She was started on bacrim DS, took full medrol dose pack and is using kenalog cream twice daily as needed. Rash is much improved. Still has some widely dispersed areas of round, red, scabbed over lesions, however, they are much fewer and lesions have more distance between them.        Current Medication: Outpatient Encounter Medications as of 06/18/2018  Medication Sig  . levonorgestrel (MIRENA) 20 MCG/24HR IUD 1 each once by Intrauterine route.  . Multiple Vitamins-Minerals (MULTIVITAMIN ADULT PO) Take by mouth.  . clotrimazole-betamethasone (LOTRISONE) cream Apply 1 application topically 2 (two) times daily.  . ergocalciferol (DRISDOL) 1.25 MG (50000 UT) capsule Take 1 capsule (50,000 Units total) by mouth once a week.  . meclizine (ANTIVERT) 25 MG tablet Take 1 tablet (25 mg total) by mouth 3 (three) times daily as needed for dizziness. (Patient not taking: Reported on 06/18/2018)  . methylPREDNISolone  (MEDROL) 4 MG TBPK tablet Take by mouth as directed for 6 days  . omeprazole (PRILOSEC) 20 MG capsule Take 1 capsule (20 mg total) by mouth daily.  Marland Kitchen triamcinolone (KENALOG) 0.025 % cream Apply 1 application topically 2 (two) times daily.  . [DISCONTINUED] methylPREDNISolone (MEDROL) 4 MG TBPK tablet Take by mouth as directed for 6 days (Patient not taking: Reported on 06/18/2018)  . [DISCONTINUED] sulfamethoxazole-trimethoprim (BACTRIM DS,SEPTRA DS) 800-160 MG tablet Take 1 tablet by mouth 2 (two) times daily. (Patient not taking: Reported on 06/18/2018)   No facility-administered encounter medications on file as of 06/18/2018.     Surgical History: Past Surgical History:  Procedure Laterality Date  . CARPAL TUNNEL RELEASE Right 09/29/2014   Procedure: CARPAL TUNNEL RELEASE;  Surgeon: Myra Rude, MD;  Location: [facility] ORS;  Service: Orthopedics;  Laterality: Right;  . CESAREAN SECTION    . IUD placement    . MYRINGOTOMY WITH TUBE PLACEMENT      Medical History: Past Medical History:  Diagnosis Date  . Anxiety   . Depression   . Eardrum rupture, left   . Headache     Family History: Family History  Problem Relation Age of Onset  . Bone cancer Maternal Grandfather   . Healthy Mother     Social History   Socioeconomic History  . Marital status: Married    Spouse name: Not on file  . Number of children: Not on file  . Years of education: Not on file  .  Highest education level: Not on file  Occupational History  . Not on file  Social Needs  . Financial resource strain: Not on file  . Food insecurity:    Worry: Not on file    Inability: Not on file  . Transportation needs:    Medical: Not on file    Non-medical: Not on file  Tobacco Use  . Smoking status: Current Some Day Smoker    Packs/day: 0.25    Types: Cigarettes  . Smokeless tobacco: Never Used  Substance and Sexual Activity  . Alcohol use: Not Currently  . Drug use: No  . Sexual activity: Yes    Birth  control/protection: I.U.D.  Lifestyle  . Physical activity:    Days per week: 0 days    Minutes per session: 0 min  . Stress: To some extent  Relationships  . Social connections:    Talks on phone: Three times a week    Gets together: Once a week    Attends religious service: Never    Active member of club or organization: No    Attends meetings of clubs or organizations: Never    Relationship status: Living with partner  . Intimate partner violence:    Fear of current or ex partner: No    Emotionally abused: No    Physically abused: No    Forced sexual activity: No  Other Topics Concern  . Not on file  Social History Narrative  . Not on file      Review of Systems  Constitutional: Negative for chills, fatigue and unexpected weight change.  HENT: Negative for congestion, postnasal drip, rhinorrhea, sneezing and sore throat.   Respiratory: Negative for cough, chest tightness, shortness of breath and wheezing.   Cardiovascular: Negative for chest pain and palpitations.  Gastrointestinal: Negative for abdominal pain, constipation, diarrhea, nausea and vomiting.       Has noted intermittent GERD symptoms.   Endocrine: Negative for cold intolerance, heat intolerance, polydipsia and polyuria.  Genitourinary: Negative for dysuria and frequency.  Musculoskeletal: Negative for arthralgias, back pain, joint swelling and neck pain.  Skin: Positive for rash.       Red, inflamed, and very itchy rash. Started on the left wrist and hand and has gradually spread all over the body. Has improved since her last visit .  Allergic/Immunologic: Negative for environmental allergies.  Neurological: Negative for dizziness, tremors, numbness and headaches.  Hematological: Negative for adenopathy. Does not bruise/bleed easily.  Psychiatric/Behavioral: Negative for behavioral problems (Depression), sleep disturbance and suicidal ideas. The patient is nervous/anxious.     Today's Vitals   06/18/18  1157  BP: 108/72  Pulse: 93  Resp: 16  SpO2: 97%  Weight: 178 lb (80.7 kg)  Height: 5\' 3"  (1.6 m)   Body mass index is 31.53 kg/m.  Physical Exam Vitals signs and nursing note reviewed.  Constitutional:      General: She is not in acute distress.    Appearance: Normal appearance. She is well-developed. She is not diaphoretic.  HENT:     Head: Normocephalic and atraumatic.     Ears:     Comments: Eczematous skin around the outer ear and external ear canals, bilaterally.     Nose: Nose normal.     Mouth/Throat:     Pharynx: No oropharyngeal exudate.  Eyes:     Pupils: Pupils are equal, round, and reactive to light.  Neck:     Musculoskeletal: Normal range of motion and neck supple.  Thyroid: No thyromegaly.     Vascular: No JVD.     Trachea: No tracheal deviation.  Cardiovascular:     Rate and Rhythm: Normal rate and regular rhythm.     Heart sounds: Normal heart sounds. No murmur. No friction rub. No gallop.   Pulmonary:     Effort: Pulmonary effort is normal. No respiratory distress.     Breath sounds: Normal breath sounds. No wheezing or rales.  Chest:     Chest wall: No tenderness.  Abdominal:     General: Bowel sounds are normal.     Palpations: Abdomen is soft.  Musculoskeletal: Normal range of motion.  Lymphadenopathy:     Cervical: No cervical adenopathy.  Skin:    General: Skin is warm and dry.     Capillary Refill: Capillary refill takes less than 2 seconds.     Findings: Rash present.     Comments: There are several round, small, scabbed lesions which are more scarcely dispersed on the body.   Neurological:     Mental Status: She is alert and oriented to person, place, and time.     Cranial Nerves: No cranial nerve deficit.  Psychiatric:        Behavior: Behavior normal.        Thought Content: Thought content normal.        Judgment: Judgment normal.   Assessment/Plan: 1. Bacterial folliculitis Significant improvement since her last visit. No  further antibiotic treatment indicated. Will repeat medrol dose pack. Take as directed for 6 days. Continue to monitor - methylPREDNISolone (MEDROL) 4 MG TBPK tablet; Take by mouth as directed for 6 days  Dispense: 21 tablet; Refill: 0  2. Atopic dermatitis, unspecified type Continue using lotrisone cream and kenalog cream twice daily as needed. Continue to monitor lusly. Will refer to dermatology as indicated.  - clotrimazole-betamethasone (LOTRISONE) cream; Apply 1 application topically 2 (two) times daily.  Dispense: 45 g; Refill: 2 - triamcinolone (KENALOG) 0.025 % cream; Apply 1 application topically 2 (two) times daily.  Dispense: 80 g; Refill: 2  3. Gastroesophageal reflux disease without esophagitis Omeprazole 20mg  may be taken daily as needed  - omeprazole (PRILOSEC) 20 MG capsule; Take 1 capsule (20 mg total) by mouth daily.  Dispense: 30 capsule; Refill: 3  4. Vitamin D deficiency Drisdol 50000iu weekly  - ergocalciferol (DRISDOL) 1.25 MG (50000 UT) capsule; Take 1 capsule (50,000 Units total) by mouth once a week.  Dispense: 4 capsule; Refill: 5  General Counseling: Greg verbalizes understanding of the findings of todays visit and agrees with plan of treatment. I have discussed any further diagnostic evaluation that may be needed or ordered today. We also reviewed her medications today. she has been encouraged to call the office with any questions or concerns that should arise related to todays visit.   This patient was seen by Vincent Gros FNP Collaboration with Dr Lyndon Code as a part of collaborative care agreement  Meds ordered this encounter  Medications  . ergocalciferol (DRISDOL) 1.25 MG (50000 UT) capsule    Sig: Take 1 capsule (50,000 Units total) by mouth once a week.    Dispense:  4 capsule    Refill:  5    Order Specific Question:   Supervising Provider    Answer:   Lyndon Code [[id number]]  . clotrimazole-betamethasone (LOTRISONE) cream    Sig: Apply 1  application topically 2 (two) times daily.    Dispense:  45 g    Refill:  2    Order Specific Question:   Supervising Provider    Answer:   Lyndon Code [[id number]]  . triamcinolone (KENALOG) 0.025 % cream    Sig: Apply 1 application topically 2 (two) times daily.    Dispense:  80 g    Refill:  2    Order Specific Question:   Supervising Provider    Answer:   Lyndon Code [[id number]]  . methylPREDNISolone (MEDROL) 4 MG TBPK tablet    Sig: Take by mouth as directed for 6 days    Dispense:  21 tablet    Refill:  0    Order Specific Question:   Supervising Provider    Answer:   Lyndon Code [[id number]]  . omeprazole (PRILOSEC) 20 MG capsule    Sig: Take 1 capsule (20 mg total) by mouth daily.    Dispense:  30 capsule    Refill:  3    Order Specific Question:   Supervising Provider    Answer:   Lyndon Code [[id number]]    Time spent: 38 Minutes      Dr Lyndon Code Internal medicine

## 2018-07-22 ENCOUNTER — Encounter: Payer: Self-pay | Admitting: Nurse Practitioner

## 2018-07-22 ENCOUNTER — Ambulatory Visit: Payer: Commercial Managed Care - PPO | Admitting: Nurse Practitioner

## 2018-07-22 ENCOUNTER — Other Ambulatory Visit: Payer: Self-pay

## 2018-07-22 VITALS — BP 122/84 | HR 87 | Temp 98.5°F | Resp 16 | Ht 63.0 in | Wt 179.6 lb

## 2018-07-22 DIAGNOSIS — G4719 Other hypersomnia: Secondary | ICD-10-CM | POA: Diagnosis not present

## 2018-07-22 DIAGNOSIS — R5383 Other fatigue: Secondary | ICD-10-CM | POA: Diagnosis not present

## 2018-07-22 LAB — POCT URINALYSIS DIPSTICK
Bilirubin, UA: NEGATIVE
Glucose, UA: NEGATIVE
Ketones, UA: NEGATIVE
Leukocytes, UA: NEGATIVE
Nitrite, UA: NEGATIVE
Protein, UA: NEGATIVE
RBC UA: NEGATIVE
Urobilinogen, UA: 0.2 E.U./dL
pH, UA: 6.5 (ref 5.0–8.0)

## 2018-07-22 NOTE — Progress Notes (Signed)
[facility] [address] [place], [place] [address]  Internal MEDICINE  Office Visit Note  Patient Name: Savannah [name]  [id number]  [id number]  Date of Service: 08/29/2018   Pt is here for a sick visit.  Chief Complaint  Patient presents with  . Fatigue    Excessive exhaustion been going on roughly around 3wks, off and on, pt has had a history of depression but has not taken any medication over 75yrs, after doing nothing pt felt really tired, pt is drinking plety of fluids, has had a headache since 830 last night, mild, started getting worse by 10am took OTC medication ibuprophen and it didnt help,   . Hypertension    pt blood pressure was checked around 12pm, 130/96 at work and blood sugar was also checked it read 86  . Pain    right wrist     The patient is complaining of severe fatigue. States that every time she sits down, it is hard for her not to fall asleep. She states that she has been told that she snores during her sleep and she never feels rested after she rests, even for the entire night.        Current Medication:  Outpatient Encounter Medications as of 07/22/2018  Medication Sig  . clotrimazole-betamethasone (LOTRISONE) cream Apply 1 application topically 2 (two) times daily.  . ergocalciferol (DRISDOL) 1.25 MG (50000 UT) capsule Take 1 capsule (50,000 Units total) by mouth once a week.  Marland Kitchen levonorgestrel (MIRENA) 20 MCG/24HR IUD 1 each once by Intrauterine route.  . methylPREDNISolone (MEDROL) 4 MG TBPK tablet Take by mouth as directed for 6 days  . Multiple Vitamins-Minerals (MULTIVITAMIN ADULT PO) Take by mouth.  Marland Kitchen omeprazole (PRILOSEC) 20 MG capsule Take 1 capsule (20 mg total) by mouth daily.  Marland Kitchen triamcinolone (KENALOG) 0.025 % cream Apply 1 application topically 2 (two) times daily.  . meclizine (ANTIVERT) 25 MG tablet Take 1 tablet (25 mg total) by mouth 3 (three) times daily as needed for dizziness. (Patient not taking: Reported on 06/18/2018)    No facility-administered encounter medications on file as of 07/22/2018.       Medical History: Past Medical History:  Diagnosis Date  . Anxiety   . Depression   . Eardrum rupture, left   . Headache      Today's Vitals   07/22/18 1622  BP: 122/84  Pulse: 87  Resp: 16  Temp: 98.5 F (36.9 C)  SpO2: 98%  Weight: 179 lb 9.6 oz (81.5 kg)  Height: 5\' 3"  (1.6 m)   Body mass index is 31.81 kg/m.   Review of Systems  Constitutional: Positive for fatigue. Negative for chills and unexpected weight change.  HENT: Negative for congestion, postnasal drip, rhinorrhea, sneezing and sore throat.   Respiratory: Negative for cough, chest tightness, shortness of breath and wheezing.   Cardiovascular: Negative for chest pain and palpitations.  Gastrointestinal: Negative for abdominal pain, constipation, diarrhea, nausea and vomiting.       Has noted intermittent GERD symptoms.   Endocrine: Negative for cold intolerance, heat intolerance, polydipsia and polyuria.  Musculoskeletal: Negative for arthralgias, back pain, joint swelling and neck pain.  Skin: Negative for rash.  Allergic/Immunologic: Negative for environmental allergies.  Neurological: Positive for headaches. Negative for dizziness, tremors and numbness.  Hematological: Negative for adenopathy. Does not bruise/bleed easily.  Psychiatric/Behavioral: Negative for behavioral problems (Depression), sleep disturbance and suicidal ideas. The patient is nervous/anxious.     Physical Exam Vitals signs and nursing  note reviewed.  Constitutional:      General: She is not in acute distress.    Appearance: Normal appearance. She is well-developed. She is not diaphoretic.  HENT:     Head: Normocephalic and atraumatic.     Ears:     Comments: Eczematous skin around the outer ear and external ear canals, bilaterally.     Nose: Nose normal.     Mouth/Throat:     Pharynx: No oropharyngeal exudate.  Eyes:     Pupils: Pupils are  equal, round, and reactive to light.  Neck:     Musculoskeletal: Normal range of motion and neck supple.     Thyroid: No thyromegaly.     Vascular: No JVD.     Trachea: No tracheal deviation.  Cardiovascular:     Rate and Rhythm: Normal rate and regular rhythm.     Heart sounds: Normal heart sounds. No murmur. No friction rub. No gallop.   Pulmonary:     Effort: Pulmonary effort is normal. No respiratory distress.     Breath sounds: Normal breath sounds. No wheezing or rales.  Chest:     Chest wall: No tenderness.  Abdominal:     General: Bowel sounds are normal.     Palpations: Abdomen is soft.  Musculoskeletal: Normal range of motion.  Lymphadenopathy:     Cervical: No cervical adenopathy.  Skin:    General: Skin is warm and dry.     Capillary Refill: Capillary refill takes less than 2 seconds.  Neurological:     Mental Status: She is alert and oriented to person, place, and time.     Cranial Nerves: No cranial nerve deficit.  Psychiatric:        Behavior: Behavior normal.        Thought Content: Thought content normal.        Judgment: Judgment normal.   Assessment/Plan:  1. Excessive daytime sleepiness Will get home sleep study for further evaluation. Will refer to Dr. Freda Munro for further evaluation as indicated.  - Home sleep test  2. Fatigue, unspecified type - POCT Urinalysis Dipstick negative for evidence of infection. Reviewed previous labs which were normal. May be related to sleep apnea. Will get home sleep test and refer to Dr. Freda Munro as indicated.   General Counseling: piney lachman understanding of the findings of todays visit and agrees with plan of treatment. I have discussed any further diagnostic evaluation that may be needed or ordered today. We also reviewed her medications today. she has been encouraged to call the office with any questions or concerns that should arise related to todays visit.    Counseling:  Patient has sign and  symptoms of OSA ( disturbed sleep, excessive fatigue during the day, uncontrolled bp and abnormal BMI). Baseline sleep study is ordered to further look into this. Long term complications of OSA was addressed with the patient.  This patient was seen by Vincent Gros FNP Collaboration with Dr Lyndon Code as a part of collaborative care agreement  Orders Placed This Encounter  Procedures  . POCT Urinalysis Dipstick  . Home sleep test     Time spent: 25 Minutes

## 2018-08-17 ENCOUNTER — Encounter: Payer: Self-pay | Admitting: Nurse Practitioner

## 2018-08-17 DIAGNOSIS — K219 Gastro-esophageal reflux disease without esophagitis: Secondary | ICD-10-CM | POA: Insufficient documentation

## 2018-08-17 DIAGNOSIS — E559 Vitamin D deficiency, unspecified: Secondary | ICD-10-CM | POA: Insufficient documentation

## 2018-08-17 DIAGNOSIS — L209 Atopic dermatitis, unspecified: Secondary | ICD-10-CM | POA: Insufficient documentation

## 2018-08-29 DIAGNOSIS — R5383 Other fatigue: Secondary | ICD-10-CM | POA: Insufficient documentation

## 2018-08-29 DIAGNOSIS — G4719 Other hypersomnia: Secondary | ICD-10-CM | POA: Insufficient documentation

## 2018-09-13 ENCOUNTER — Other Ambulatory Visit: Payer: Self-pay | Admitting: Nurse Practitioner

## 2018-09-13 DIAGNOSIS — K219 Gastro-esophageal reflux disease without esophagitis: Secondary | ICD-10-CM

## 2018-09-13 MED ORDER — OMEPRAZOLE 20 MG PO CPDR: DELAYED_RELEASE_CAPSULE | ORAL | 1 refills | Status: DC

## 2018-09-16 ENCOUNTER — Ambulatory Visit: Payer: Self-pay | Admitting: Nurse Practitioner

## 2018-09-20 ENCOUNTER — Other Ambulatory Visit: Payer: Self-pay

## 2018-09-20 ENCOUNTER — Other Ambulatory Visit: Payer: Commercial Managed Care - PPO | Admitting: Internal Medicine

## 2018-09-20 DIAGNOSIS — G4719 Other hypersomnia: Secondary | ICD-10-CM | POA: Diagnosis not present

## 2018-09-20 NOTE — Progress Notes (Signed)
Patient picked up home sleep study , patient was given verbal and written instructions. 

## 2018-09-22 NOTE — Procedures (Signed)
[facility] [address] [place], [place] [address]  Sleep Specialist: Yevonne Pax, MD [facility]  Home Sleep Study Interpretation  Patient Name: Savannah [name] Patient MR FXJOIT:[id number] DOB:Feb 21, 1986  Date of Study: Sep 20, 2018  Indications for study: Hypersomnia excessive daytime somnolence  BMI: 31.7 kg/m       Respiratory Data:  Total AHI: 3.1/h  Total Obstructive Apneas: 2  Total Central Apneas: 0  Total Mixed Apneas: 0  Total Hypopneas: 25  If the AHI is greater than 5 per hour patient qualifies for PAP evaluation  Oximetry Data:  Oxygen Desaturation Index: 4.4/h  Lowest Desaturation: 88%  Cardiac Data:  Minimum Heart Rate: 54  Maximum Heart Rate: 123   Impression / Diagnosis:  This apnea study does not show any significant obstructive sleep apnea.  Patient does have oxygen desaturation noted and may warrant evaluation for nocturnal oxygen support.  Patient did have tachycardia noted further cardiac work-up as clinically indicated.  GENERAL Recommendations:  1.  Consider Auto PAP with pressure ranges 5-20 cmH20 with download, or facility based PAP Titration Study  2.  Consider PAP interface mask fitted for patient comfort, Heated Humidification & PAP compliance monitoring (1 month, 3 months & 12 months after PAP initiation)  3. Consider treatment with mandibular advancement splint (MAS) or referral to an ENT surgeon for modification to the upper airway if the patient prefers an alternate therapy or the PAP trial is unsuccessful  4. Sleep hygiene measures should be discussed with the patient  5. Behavioral therapy such as weight reduction or smoking cessation as appropriate for the patient  6. Advise patient against the use of alcohol or sedatives in so much as these substances can worsen excessive daytime sleepiness and respiratory disturbances of sleep  7. Advise patient against participating in potentially dangerous activities  while drowsy such as operating a motor vehicle, heavy equipment or power tools as it can put them and others in danger  8. Advise patient of the long term consequences of OSA if left untreated, need for treatment and close follow up  9. Clinical follow up as deemed necessary     This Level III home sleep study was performed using the [employer], a 4 channel screening device subject to limitations. Depending on actual total sleep time, not measured in this study, the AHI (sum of apneas and hypopneas/hr of sleep) and therefore the severity of sleep apnea may be underestimated. As with any single night study, including Level 1 attended PSG, severity of sleep apnea may also be underestimated due to the lack of supine and/or REM sleep.  The interpretation associated with this report is based on normal values and degrees of severity in accordance with AASM parameters and/or estimated from multiple sources in the literature for adults ages 21-80+. These may not agree with the displayed values. The patient's treating physician should use the interpretation and recommendations in conjunction with the overall clinical evaluation and treatment of the patient.  Some of the terminology used in this scored [facility] report was developed several years ago and may not always be in accordance with current nomenclature. This in no way affects the accuracy of the data or the reliability of the interpretation and recommendations.

## 2018-09-30 ENCOUNTER — Other Ambulatory Visit: Payer: Self-pay

## 2018-09-30 ENCOUNTER — Ambulatory Visit: Payer: Commercial Managed Care - PPO | Admitting: Adult Health

## 2018-09-30 ENCOUNTER — Telehealth: Payer: Self-pay

## 2018-09-30 ENCOUNTER — Encounter: Payer: Self-pay | Admitting: Adult Health

## 2018-09-30 VITALS — BP 110/74 | HR 85 | Resp 16 | Ht 63.0 in | Wt 188.0 lb

## 2018-09-30 DIAGNOSIS — G4719 Other hypersomnia: Secondary | ICD-10-CM | POA: Diagnosis not present

## 2018-09-30 DIAGNOSIS — R5383 Other fatigue: Secondary | ICD-10-CM

## 2018-09-30 NOTE — Patient Instructions (Signed)
Hypoxia Hypoxia is a condition that happens when there is a lack of oxygen in the body's tissues and organs. When there is not enough oxygen, organs cannot work as they should. This causes serious problems throughout the body and in the brain. What are the causes? This condition may be caused by:  Exposure to high altitude.  A collapsed lung (pneumothorax).  Lung infection (pneumonia).  Lung injury.  Long-term (chronic) lung disease, such as COPD (chronic obstructive pulmonary disease).  Blood collecting in the chest cavity (hemothorax).  Food, saliva, or vomit getting into the airway (aspiration).  Reduced blood flow (ischemia).  Severe blood loss.  Slow or shallow breathing (hypoventilation).  Blood disorders, such as anemia.  Carbon monoxide poisoning.  The heart suddenly stopping (cardiac arrest).  Anesthetic medicines.  Drowning.  Choking. What are the signs or symptoms? Symptoms of this condition include:  Headache.  Fatigue.  Drowsiness.  Forgetfulness.  Nausea.  Confusion.  Shortness of breath.  Dizziness.  Bluish color of the skin, lips, or nail beds (cyanosis).  Change in consciousness or awareness. If hypoxia is not treated, it can lead to convulsions, loss of consciousness (coma), or brain damage. How is this diagnosed? This condition may be diagnosed based on:  A physical exam.  Blood tests.  A test that measures how much oxygen is in your blood (pulse oximetry). This is done with a sensor that is placed on your finger, toe, or earlobe.  Chest X-ray.  Tests to check your lung function (pulmonary function tests).  A test to check the electrical activity of your heart (electrocardiogram, ECG). You may have other tests to determine the cause of your hypoxia. How is this treated?  Treatment for this condition depends on what is causing the hypoxia. You will likely be treated with oxygen therapy. This may be done by giving you oxygen  through a face mask or through tubes in your nose. Your health care provider may also recommend other therapies to treat the underlying cause of your hypoxia. Follow these instructions at home:  Take over-the-counter and prescription medicines only as told by your health care provider.  Do not use any products that contain nicotine or tobacco, such as cigarettes and e-cigarettes. If you need help quitting, ask your health care provider.  Avoid secondhand smoke.  Work with your health care provider to manage any chronic conditions you have that may be causing hypoxia, such as COPD.  Keep all follow-up visits as told by your health care provider. This is important. Contact a health care provider if:  You have a fever.  You have trouble breathing, even after treatment.  You become extremely short of breath when you exercise. Get help right away if:  Your shortness of breath gets worse, especially with normal or very little activity.  Your skin, lips, or nail beds have a bluish color.  You become confused or you cannot think properly.  You have chest pain. Summary  Hypoxia is a condition that happens when there is a lack of oxygen in the body's tissues and organs.  If hypoxia is not treated, it can lead to convulsions, loss of consciousness (coma), or brain damage.  Symptoms of hypoxia can include a headache, shortness of breath, confusion, nausea, and a bluish skin color.  Hypoxia has many possible causes, including exposure to high altitude, carbon monoxide poisoning, or other health issues, such as blood disorders or cardiac arrest.  Hypoxia is usually treated with oxygen therapy. This information is not   intended to replace advice given to you by your health care provider. Make sure you discuss any questions you have with your health care provider. Document Released: 06/16/2016 Document Revised: 06/16/2016 Document Reviewed: 06/16/2016 Elsevier Interactive Patient Education   2019 [employer] Inc.  

## 2018-09-30 NOTE — Telephone Encounter (Signed)
Gave [facility] order for overnight oximetry test and put copy in scan. Savannah [name] °

## 2018-09-30 NOTE — Progress Notes (Signed)
[facility] [address] [place], [place] [address]  Pulmonary Sleep Medicine   Office Visit Note  Patient Name: Savannah [name] DOB: 01/11/1986 MRN [id number]  Date of Service: 09/30/2018  Complaints/HPI: Pt is here for follow up on sleep study that was ordered by her PCP.  Her sleep study shows an AHI of 3.1 only recording 2 obstructive apneas and 25 hypopneas.  Based on this study it does not show any second amount of obstructive sleep apnea.  Although she does have some oxygen desaturations noted and an overnight oximetry should be done to determine if nocturnal oxygen is needed.  ROS  General: (-) fever, (-) chills, (-) night sweats, (-) weakness Skin: (-) rashes, (-) itching,. Eyes: (-) visual changes, (-) redness, (-) itching. Nose and Sinuses: (-) nasal stuffiness or itchiness, (-) postnasal drip, (-) nosebleeds, (-) sinus trouble. Mouth and Throat: (-) sore throat, (-) hoarseness. Neck: (-) swollen glands, (-) enlarged thyroid, (-) neck pain. Respiratory: - cough, (-) bloody sputum, - shortness of breath, - wheezing. Cardiovascular: - ankle swelling, (-) chest pain. Lymphatic: (-) lymph node enlargement. Neurologic: (-) numbness, (-) tingling. Psychiatric: (-) anxiety, (-) depression   Current Medication: Outpatient Encounter Medications as of 09/30/2018  Medication Sig  . clotrimazole-betamethasone (LOTRISONE) cream Apply 1 application topically 2 (two) times daily.  . ergocalciferol (DRISDOL) 1.25 MG (50000 UT) capsule Take 1 capsule (50,000 Units total) by mouth once a week.  Marland Kitchen. levonorgestrel (MIRENA) 20 MCG/24HR IUD 1 each once by Intrauterine route.  . Multiple Vitamins-Minerals (MULTIVITAMIN ADULT PO) Take by mouth.  Marland Kitchen. omeprazole (PRILOSEC) 20 MG capsule TAKE 1 CAPSULE BY MOUTH EVERY DAY  . triamcinolone (KENALOG) 0.025 % cream Apply 1 application topically 2 (two) times daily.  . meclizine (ANTIVERT) 25 MG tablet Take 1 tablet (25 mg total) by mouth  3 (three) times daily as needed for dizziness. (Patient not taking: Reported on 06/18/2018)  . methylPREDNISolone (MEDROL) 4 MG TBPK tablet Take by mouth as directed for 6 days (Patient not taking: Reported on 09/30/2018)   No facility-administered encounter medications on file as of 09/30/2018.     Surgical History: Past Surgical History:  Procedure Laterality Date  . CARPAL TUNNEL RELEASE Right 09/29/2014   Procedure: CARPAL TUNNEL RELEASE;  Surgeon: Myra Rudehristopher Smith, MD;  Location: [facility] ORS;  Service: Orthopedics;  Laterality: Right;  . CESAREAN SECTION    . IUD placement    . MYRINGOTOMY WITH TUBE PLACEMENT      Medical History: Past Medical History:  Diagnosis Date  . Anxiety   . Depression   . Eardrum rupture, left   . Headache     Family History: Family History  Problem Relation Age of Onset  . Bone cancer Maternal Grandfather   . Healthy Mother     Social History: Social History   Socioeconomic History  . Marital status: Married    Spouse name: Not on file  . Number of children: Not on file  . Years of education: Not on file  . Highest education level: Not on file  Occupational History  . Not on file  Social Needs  . Financial resource strain: Not on file  . Food insecurity:    Worry: Not on file    Inability: Not on file  . Transportation needs:    Medical: Not on file    Non-medical: Not on file  Tobacco Use  . Smoking status: Current Some Day Smoker    Packs/day: 0.25    Types: Cigarettes  .  Smokeless tobacco: Never Used  Substance and Sexual Activity  . Alcohol use: Not Currently  . Drug use: No  . Sexual activity: Yes    Birth control/protection: I.U.D.  Lifestyle  . Physical activity:    Days per week: 0 days    Minutes per session: 0 min  . Stress: To some extent  Relationships  . Social connections:    Talks on phone: Three times a week    Gets together: Once a week    Attends religious service: Never    Active member of club or  organization: No    Attends meetings of clubs or organizations: Never    Relationship status: Living with partner  . Intimate partner violence:    Fear of current or ex partner: No    Emotionally abused: No    Physically abused: No    Forced sexual activity: No  Other Topics Concern  . Not on file  Social History Narrative  . Not on file    Vital Signs: Blood pressure 110/74, pulse 85, resp. rate 16, height 5\' 3"  (1.6 m), weight 188 lb (85.3 kg), SpO2 99 %.  Examination: General Appearance: The patient is well-developed, well-nourished, and in no distress. Skin: Gross inspection of skin unremarkable. Head: normocephalic, no gross deformities. Eyes: no gross deformities noted. ENT: ears appear grossly normal no exudates. Neck: Supple. No thyromegaly. No LAD. Respiratory: clear bilateraly. Cardiovascular: Normal S1 and S2 without murmur or rub. Extremities: No cyanosis. pulses are equal. Neurologic: Alert and oriented. No involuntary movements.  LABS: Recent Results (from the past 2160 hour(s))  POCT Urinalysis Dipstick     Status: None   Collection Time: 07/22/18  4:48 PM  Result Value Ref Range   Color, UA     Clarity, UA     Glucose, UA Negative Negative   Bilirubin, UA negative    Ketones, UA negative    Spec Grav, UA     Blood, UA negative    pH, UA 6.5 5.0 - 8.0   Protein, UA Negative Negative   Urobilinogen, UA 0.2 0.2 or 1.0 E.U./dL   Nitrite, UA negative    Leukocytes, UA Negative Negative   Appearance     Odor      Radiology: No results found.  No results found.  No results found.    Assessment and Plan: Patient Active Problem List   Diagnosis Date Noted  . Excessive daytime sleepiness 08/29/2018  . Fatigue 08/29/2018  . Atopic dermatitis 08/17/2018  . Gastroesophageal reflux disease without esophagitis 08/17/2018  . Vitamin D deficiency 08/17/2018  . Carpal tunnel syndrome 06/01/2018  . Bacterial folliculitis 06/01/2018  . Irritable bowel  syndrome with both constipation and diarrhea 06/01/2018  . Genital warts 03/26/2017  . Depression 01/11/2015  . Psoriasis 01/11/2015  . Ankle pain 01/11/2015   1. Excessive daytime sleepiness We will get overnight oximetry and see if patient qualifies for nighttime oxygen use. - Pulse oximetry, overnight; Future  2. Fatigue, unspecified type If patient qualifies for nighttime oxygen will continue to follow and see if fatigue improves.   General Counseling: I have discussed the findings of the evaluation and examination with Savannah [name].  I have also discussed any further diagnostic evaluation thatmay be needed or ordered today. Savannah [name] verbalizes understanding of the findings of todays visit. We also reviewed her medications today and discussed drug interactions and side effects including but not limited excessive drowsiness and altered mental states. We also discussed that there is always a  risk not just to her but also people around her. she has been encouraged to call the office with any questions or concerns that should arise related to todays visit.    Time spent: 25 This patient was seen by Blima Ledger AGNP-C in Collaboration with Dr. Freda Munro as a part of collaborative care agreement.   I have personally obtained a history, examined the patient, evaluated laboratory and imaging results, formulated the assessment and plan and placed orders.    Yevonne Pax, MD [facility] Pulmonary and Critical Care Sleep medicine

## 2018-10-06 ENCOUNTER — Other Ambulatory Visit: Payer: Self-pay

## 2018-10-06 DIAGNOSIS — K219 Gastro-esophageal reflux disease without esophagitis: Secondary | ICD-10-CM

## 2018-10-06 MED ORDER — OMEPRAZOLE 20 MG PO CPDR: DELAYED_RELEASE_CAPSULE | ORAL | 1 refills | Status: DC

## 2018-10-07 ENCOUNTER — Ambulatory Visit: Payer: Commercial Managed Care - PPO | Admitting: Adult Health

## 2018-10-07 ENCOUNTER — Other Ambulatory Visit: Payer: Self-pay

## 2018-10-07 ENCOUNTER — Encounter: Payer: Self-pay | Admitting: Adult Health

## 2018-10-07 DIAGNOSIS — R5383 Other fatigue: Secondary | ICD-10-CM | POA: Diagnosis not present

## 2018-10-07 DIAGNOSIS — M5030 Other cervical disc degeneration, unspecified cervical region: Secondary | ICD-10-CM | POA: Diagnosis not present

## 2018-10-07 DIAGNOSIS — R0602 Shortness of breath: Secondary | ICD-10-CM

## 2018-10-07 MED ORDER — PREDNISONE 10 MG PO TABS: ORAL_TABLET | ORAL | 0 refills | Status: DC

## 2018-10-07 NOTE — Progress Notes (Signed)
[facility] [address] [place], [place] [address]  Internal MEDICINE  Telephone Visit  Patient Name: Savannah [name]  [id number]-May-1987  [id number]  Date of Service: 10/07/2018  I connected with the patient at 928 by video and verified the patients identity using two identifiers.   I discussed the limitations, risks, security and privacy concerns of performing an evaluation and management service by telephone and the availability of in person appointments. I also discussed with the patient that there may be a patient responsible charge related to the service.  The patient expressed understanding and agrees to proceed.    Chief Complaint  Patient presents with  . Telephone Screen  . Shortness of Breath  . Chest Pain    HEAVINESS IN THE MORNINGS   . Arm Pain    RIGHT SHOULDER PAIN , WEAKNESS   . Fatigue    HAS INCREASED   . Telephone Assessment    HPI PT seen via video visit.  She reports 4-5 days of chest tightness/heaviness when waking up. It typically resolves in about 30 minutes.  She denies any palpitations or chest pain. She has been evaluated with sleep study, and is currently awaiting an overnight oximetry study. She has also noticed some DOE.  She has never been diagnosed with asthma or lung issues, however she is obese. She is also complaining of some spine pain that radiates under her right shoulder blade.  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  She reports a history of DDD at this location. She feels like her overall fatigue has gotten worse, but can not describe any new symptoms at this time.  She is still able to work, although having to wear a mask at work makes her breath hard, and feel short of breath while working.       Current Medication: Outpatient Encounter Medications as of 10/07/2018  Medication Sig  . ergocalciferol (DRISDOL) 1.25 MG (50000 UT) capsule Take 1 capsule (50,000 Units total) by mouth once a week.   Marland Kitchen. levonorgestrel (MIRENA) 20 MCG/24HR IUD 1 each once by Intrauterine route.  Marland Kitchen. omeprazole (PRILOSEC) 20 MG capsule TAKE 1 CAPSULE BY MOUTH EVERY DAY  . triamcinolone (KENALOG) 0.025 % cream Apply 1 application topically 2 (two) times daily.  . clotrimazole-betamethasone (LOTRISONE) cream Apply 1 application topically 2 (two) times daily.  . meclizine (ANTIVERT) 25 MG tablet Take 1 tablet (25 mg total) by mouth 3 (three) times daily as needed for dizziness. (Patient not taking: Reported on 06/18/2018)  . methylPREDNISolone (MEDROL) 4 MG TBPK tablet Take by mouth as directed for 6 days (Patient not taking: Reported on 09/30/2018)  . Multiple Vitamins-Minerals (MULTIVITAMIN ADULT PO) Take by mouth.   No facility-administered encounter medications on file as of 10/07/2018.     Surgical History: Past Surgical History:  Procedure Laterality Date  . CARPAL TUNNEL RELEASE Right 09/29/2014   Procedure: CARPAL TUNNEL RELEASE;  Surgeon: Myra Rudehristopher Smith, MD;  Location: [facility] ORS;  Service: Orthopedics;  Laterality: Right;  . CESAREAN SECTION    . IUD placement    . MYRINGOTOMY WITH TUBE PLACEMENT      Medical History: Past Medical History:  Diagnosis Date  . Anxiety   . Depression   . Eardrum rupture, left   . Headache     Family History: Family History  Problem Relation Age of Onset  . Bone cancer Maternal Grandfather   . Healthy Mother     Social History  Socioeconomic History  . Marital status: Married    Spouse name: Not on file  . Number of children: Not on file  . Years of education: Not on file  . Highest education level: Not on file  Occupational History  . Not on file  Social Needs  . Financial resource strain: Not on file  . Food insecurity:    Worry: Not on file    Inability: Not on file  . Transportation needs:    Medical: Not on file    Non-medical: Not on file  Tobacco Use  . Smoking status: Former Smoker    Packs/day: 0.25    Types: Cigarettes    Last  attempt to quit: 06/14/2018    Years since quitting: 0.3  . Smokeless tobacco: Never Used  Substance and Sexual Activity  . Alcohol use: Not Currently  . Drug use: No  . Sexual activity: Yes    Birth control/protection: I.U.D.  Lifestyle  . Physical activity:    Days per week: 0 days    Minutes per session: 0 min  . Stress: To some extent  Relationships  . Social connections:    Talks on phone: Three times a week    Gets together: Once a week    Attends religious service: Never    Active member of club or organization: No    Attends meetings of clubs or organizations: Never    Relationship status: Living with partner  . Intimate partner violence:    Fear of current or ex partner: No    Emotionally abused: No    Physically abused: No    Forced sexual activity: No  Other Topics Concern  . Not on file  Social History Narrative  . Not on file      Review of Systems  Constitutional: Negative for chills, fatigue and unexpected weight change.  HENT: Negative for congestion, rhinorrhea, sneezing and sore throat.   Eyes: Negative for photophobia, pain and redness.  Respiratory: Positive for chest tightness and shortness of breath. Negative for cough.   Cardiovascular: Negative for chest pain and palpitations.  Gastrointestinal: Negative for abdominal pain, constipation, diarrhea, nausea and vomiting.  Endocrine: Negative.   Genitourinary: Negative for dysuria and frequency.  Musculoskeletal: Negative for arthralgias, back pain, joint swelling and neck pain.       Shoulder/ back pain.  Hx of DDD  Skin: Negative for rash.  Allergic/Immunologic: Negative.   Neurological: Negative for tremors and numbness.  Hematological: Negative for adenopathy. Does not bruise/bleed easily.  Psychiatric/Behavioral: Negative for behavioral problems and sleep disturbance. The patient is not nervous/anxious.     Vital Signs: There were no vitals taken for this  visit.   Observation/Objective:  Well appearing, NAD noted.  Speaking in full sentences.    Assessment/Plan: 1. Fatigue, unspecified type Most likely due to nocturnal hypoxia.  Overnight oximetry is ordered, awaiting test results.   2. DDD (degenerative disc disease), cervical Will try prednisone for inflammation.  Pt will continue to use otc antiinflammatory medications as well.  - predniSONE (DELTASONE) 10 MG tablet; Use per dose pack  Dispense: 21 tablet; Refill: 0  3. SOB (shortness of breath) Will have chest x-ray and PFTs done.  Follow up in office when results are available.  - Pulmonary Function Test; Future - DG Chest 2 View; Future  General Counseling: britten parady understanding of the findings of today's phone visit and agrees with plan of treatment. I have discussed any further diagnostic evaluation that may be  needed or ordered today. We also reviewed her medications today. she has been encouraged to call the office with any questions or concerns that should arise related to todays visit.    No orders of the defined types were placed in this encounter.   No orders of the defined types were placed in this encounter.   Time spent: 11 Minutes    Blima Ledger AGNP-C Internal medicine

## 2018-10-13 ENCOUNTER — Encounter: Payer: Self-pay | Admitting: Adult Health

## 2018-10-13 ENCOUNTER — Telehealth: Payer: Self-pay | Admitting: Internal Medicine

## 2018-10-20 ENCOUNTER — Ambulatory Visit: Payer: Commercial Managed Care - PPO | Admitting: Internal Medicine

## 2018-10-20 ENCOUNTER — Other Ambulatory Visit: Payer: Self-pay

## 2018-10-20 ENCOUNTER — Ambulatory Visit
Admission: RE | Admit: 2018-10-20 | Discharge: 2018-10-20 | Disposition: A | Discharge status: Home / Self Care | Payer: Commercial Managed Care - PPO | Source: Ambulatory Visit | Attending: Adult Health | Admitting: Adult Health

## 2018-10-20 DIAGNOSIS — R0602 Shortness of breath: Secondary | ICD-10-CM

## 2018-10-20 LAB — PULMONARY FUNCTION TEST

## 2018-10-24 NOTE — Procedures (Signed)
[place], [address]  DATE OF SERVICE: October 20, 2018  Complete Pulmonary Function Testing Interpretation:  FINDINGS:  Forced vital capacity is normal the FEV1 is normal FEV1 FVC ratio is normal.  Total lung capacity is normal residual volume is normal residual in total lung capacity ratio is decreased.  Total gas volume is normal.  The DLCO is within normal limits.  Postbronchodilator no significant change in the FEV1 clinical improvement may still occur in the absence of spirometric improvement.  IMPRESSION:  This pulmonary function study is within normal limits.  Allyne Gee, MD [facility] Pulmonary Critical Care Medicine Sleep Medicine

## 2018-11-01 ENCOUNTER — Ambulatory Visit: Payer: Commercial Managed Care - PPO | Admitting: Internal Medicine

## 2018-11-01 ENCOUNTER — Encounter: Payer: Self-pay | Admitting: Internal Medicine

## 2018-11-01 ENCOUNTER — Other Ambulatory Visit: Payer: Self-pay

## 2018-11-01 VITALS — BP 110/84 | HR 96 | Resp 16 | Ht 63.0 in | Wt 186.0 lb

## 2018-11-01 DIAGNOSIS — E6609 Other obesity due to excess calories: Secondary | ICD-10-CM | POA: Diagnosis not present

## 2018-11-01 DIAGNOSIS — R5383 Other fatigue: Secondary | ICD-10-CM

## 2018-11-01 DIAGNOSIS — Z6833 Body mass index (BMI) 33.0-33.9, adult: Secondary | ICD-10-CM

## 2018-11-01 DIAGNOSIS — R0609 Other forms of dyspnea: Secondary | ICD-10-CM | POA: Diagnosis not present

## 2018-11-01 NOTE — Progress Notes (Signed)
[facility] [place], [place] [address]  Pulmonary Sleep Medicine   Office Visit Note  Patient Name: Savannah [name] DOB: Mar 28, 1986 MRN [id number]  Date of Service: 11/01/2018  Complaints/HPI: Pt is here for follow up.  She has had a CXR and PFT which show no abnormality at this time. She continues to complain of fatigue especially in the morning when she first wakes up.  She has some swelling in her feet at times, but denies any other complaints.   ROS  General: (-) fever, (-) chills, (-) night sweats, (-) weakness Skin: (-) rashes, (-) itching,. Eyes: (-) visual changes, (-) redness, (-) itching. Nose and Sinuses: (-) nasal stuffiness or itchiness, (-) postnasal drip, (-) nosebleeds, (-) sinus trouble. Mouth and Throat: (-) sore throat, (-) hoarseness. Neck: (-) swollen glands, (-) enlarged thyroid, (-) neck pain. Respiratory: - cough, (-) bloody sputum, - shortness of breath, - wheezing. Cardiovascular: - ankle swelling, (-) chest pain. Lymphatic: (-) lymph node enlargement. Neurologic: (-) numbness, (-) tingling. Psychiatric: (-) anxiety, (-) depression   Current Medication: Outpatient Encounter Medications as of 11/01/2018  Medication Sig  . clotrimazole-betamethasone (LOTRISONE) cream Apply 1 application topically 2 (two) times daily.  . ergocalciferol (DRISDOL) 1.25 MG (50000 UT) capsule Take 1 capsule (50,000 Units total) by mouth once a week.  Marland Kitchen levonorgestrel (MIRENA) 20 MCG/24HR IUD 1 each once by Intrauterine route.  Marland Kitchen omeprazole (PRILOSEC) 20 MG capsule TAKE 1 CAPSULE BY MOUTH EVERY DAY  . triamcinolone (KENALOG) 0.025 % cream Apply 1 application topically 2 (two) times daily.  . [DISCONTINUED] meclizine (ANTIVERT) 25 MG tablet Take 1 tablet (25 mg total) by mouth 3 (three) times daily as needed for dizziness. (Patient not taking: Reported on 06/18/2018)  . [DISCONTINUED] methylPREDNISolone (MEDROL) 4 MG TBPK tablet Take by mouth as directed  for 6 days (Patient not taking: Reported on 09/30/2018)  . [DISCONTINUED] Multiple Vitamins-Minerals (MULTIVITAMIN ADULT PO) Take by mouth.  . [DISCONTINUED] predniSONE (DELTASONE) 10 MG tablet Use per dose pack (Patient not taking: Reported on 11/01/2018)   No facility-administered encounter medications on file as of 11/01/2018.     Surgical History: Past Surgical History:  Procedure Laterality Date  . CARPAL TUNNEL RELEASE Right 09/29/2014   Procedure: CARPAL TUNNEL RELEASE;  Surgeon: Christophe Louis, MD;  Location: [facility] ORS;  Service: Orthopedics;  Laterality: Right;  . CESAREAN SECTION    . IUD placement    . MYRINGOTOMY WITH TUBE PLACEMENT      Medical History: Past Medical History:  Diagnosis Date  . Anxiety   . Depression   . Eardrum rupture, left   . Headache     Family History: Family History  Problem Relation Age of Onset  . Bone cancer Maternal Grandfather   . Healthy Mother     Social History: Social History   Socioeconomic History  . Marital status: Married    Spouse name: Not on file  . Number of children: Not on file  . Years of education: Not on file  . Highest education level: Not on file  Occupational History  . Not on file  Social Needs  . Financial resource strain: Not on file  . Food insecurity    Worry: Not on file    Inability: Not on file  . Transportation needs    Medical: Not on file    Non-medical: Not on file  Tobacco Use  . Smoking status: Former Smoker    Packs/day: 0.25    Types: Cigarettes  Quit date: 06/14/2018    Years since quitting: 0.3  . Smokeless tobacco: Never Used  Substance and Sexual Activity  . Alcohol use: Not Currently  . Drug use: No  . Sexual activity: Yes    Birth control/protection: I.U.D.  Lifestyle  . Physical activity    Days per week: 0 days    Minutes per session: 0 min  . Stress: To some extent  Relationships  . Social Musicianconnections    Talks on phone: Three times a week    Gets together: Once a  week    Attends religious service: Never    Active member of club or organization: No    Attends meetings of clubs or organizations: Never    Relationship status: Living with partner  . Intimate partner violence    Fear of current or ex partner: No    Emotionally abused: No    Physically abused: No    Forced sexual activity: No  Other Topics Concern  . Not on file  Social History Narrative  . Not on file    Vital Signs: Blood pressure 110/84, pulse 96, resp. rate 16, height 5\' 3"  (1.6 m), weight 186 lb (84.4 kg), SpO2 97 %.  Examination: General Appearance: The patient is well-developed, well-nourished, and in no distress. Skin: Gross inspection of skin unremarkable. Head: normocephalic, no gross deformities. Eyes: no gross deformities noted. ENT: ears appear grossly normal no exudates. Neck: Supple. No thyromegaly. No LAD. Respiratory: clear bilaterally. Cardiovascular: Normal S1 and S2 without murmur or rub. Extremities: No cyanosis. pulses are equal. Neurologic: Alert and oriented. No involuntary movements.  LABS: Recent Results (from the past 2160 hour(s))  Pulmonary Function Test     Status: None   Collection Time: 10/20/18  9:00 AM  Result Value Ref Range   FEV1     FVC     FEV1/FVC     TLC     DLCO      Radiology: Dg Chest 2 View  Result Date: 10/20/2018 CLINICAL DATA:  Shortness of breath EXAM: CHEST - 2 VIEW COMPARISON:  None. FINDINGS: The heart size and mediastinal contours are within normal limits. Both lungs are clear. The visualized skeletal structures are unremarkable. IMPRESSION: No acute abnormality of the lungs. Electronically Signed   By: Lauralyn PrimesAlex  Bibbey M.D.   On: 10/20/2018 14:20    No results found.  Dg Chest 2 View  Result Date: 10/20/2018 CLINICAL DATA:  Shortness of breath EXAM: CHEST - 2 VIEW COMPARISON:  None. FINDINGS: The heart size and mediastinal contours are within normal limits. Both lungs are clear. The visualized skeletal structures  are unremarkable. IMPRESSION: No acute abnormality of the lungs. Electronically Signed   By: Lauralyn PrimesAlex  Bibbey M.D.   On: 10/20/2018 14:20      Assessment and Plan: Patient Active Problem List   Diagnosis Date Noted  . Excessive daytime sleepiness 08/29/2018  . Fatigue 08/29/2018  . Atopic dermatitis 08/17/2018  . Gastroesophageal reflux disease without esophagitis 08/17/2018  . Vitamin D deficiency 08/17/2018  . Carpal tunnel syndrome 06/01/2018  . Bacterial folliculitis 06/01/2018  . Irritable bowel syndrome with both constipation and diarrhea 06/01/2018  . Genital warts 03/26/2017  . Depression 01/11/2015  . Psoriasis 01/11/2015  . Ankle pain 01/11/2015   1. Dyspnea on exertion Will get echo to rule out an cardiac origins for this dyspnea on exertion.  - ECHOCARDIOGRAM COMPLETE; Future  2. Fatigue, unspecified type Unchanged at this time.   3. Class 1 obesity due  to excess calories without serious comorbidity with body mass index (BMI) of 33.0 to 33.9 in adult Obesity Counseling: Risk Assessment: An assessment of behavioral risk factors was made today and includes lack of exercise sedentary lifestyle, lack of portion control and poor dietary habits.  Risk Modification Advice: She was counseled on portion control guidelines. Restricting daily caloric intake to. . The detrimental long term effects of obesity on her health and ongoing poor compliance was also discussed with the patient.    General Counseling: I have discussed the findings of the evaluation and examination with Morrie SheldonAshley.  I have also discussed any further diagnostic evaluation thatmay be needed or ordered today. Morrie Sheldonshley verbalizes understanding of the findings of todays visit. We also reviewed her medications today and discussed drug interactions and side effects including but not limited excessive drowsiness and altered mental states. We also discussed that there is always a risk not just to her but also people around  her. she has been encouraged to call the office with any questions or concerns that should arise related to todays visit.    Time spent: 20 This patient was seen by Blima LedgerAdam Therma Lasure AGNP-C in Collaboration with Dr. Freda MunroSaadat Khan as a part of collaborative care agreement.    I have personally obtained a history, examined the patient, evaluated laboratory and imaging results, formulated the assessment and plan and placed orders.    Yevonne PaxSaadat A Khan, MD [facility] Pulmonary and Critical Care Sleep medicine

## 2018-11-16 NOTE — Telephone Encounter (Signed)
Patient has been scheduled for additional testing. Savannah [name]

## 2018-11-19 ENCOUNTER — Ambulatory Visit: Payer: Commercial Managed Care - PPO

## 2018-11-19 ENCOUNTER — Other Ambulatory Visit: Payer: Self-pay

## 2018-11-19 DIAGNOSIS — R0602 Shortness of breath: Secondary | ICD-10-CM | POA: Diagnosis not present

## 2018-11-19 DIAGNOSIS — R0609 Other forms of dyspnea: Secondary | ICD-10-CM

## 2018-12-02 ENCOUNTER — Other Ambulatory Visit: Payer: Self-pay

## 2018-12-02 DIAGNOSIS — E559 Vitamin D deficiency, unspecified: Secondary | ICD-10-CM

## 2018-12-02 MED ORDER — ERGOCALCIFEROL 1.25 MG (50000 UT) PO CAPS: 50000.0000 [IU] | ORAL_CAPSULE | ORAL | 1 refills | Status: DC

## 2018-12-27 ENCOUNTER — Ambulatory Visit: Payer: Commercial Managed Care - PPO | Admitting: Internal Medicine

## 2018-12-27 ENCOUNTER — Encounter: Payer: Self-pay | Admitting: Internal Medicine

## 2018-12-27 ENCOUNTER — Other Ambulatory Visit: Payer: Self-pay

## 2018-12-27 VITALS — BP 120/84 | HR 99 | Resp 16 | Ht 63.0 in | Wt 190.0 lb

## 2018-12-27 DIAGNOSIS — E6609 Other obesity due to excess calories: Secondary | ICD-10-CM | POA: Diagnosis not present

## 2018-12-27 DIAGNOSIS — E559 Vitamin D deficiency, unspecified: Secondary | ICD-10-CM

## 2018-12-27 DIAGNOSIS — K219 Gastro-esophageal reflux disease without esophagitis: Secondary | ICD-10-CM | POA: Diagnosis not present

## 2018-12-27 DIAGNOSIS — Z6833 Body mass index (BMI) 33.0-33.9, adult: Secondary | ICD-10-CM

## 2018-12-27 DIAGNOSIS — R5383 Other fatigue: Secondary | ICD-10-CM

## 2018-12-27 MED ORDER — ERGOCALCIFEROL 1.25 MG (50000 UT) PO CAPS: 50000.0000 [IU] | ORAL_CAPSULE | ORAL | 1 refills | Status: DC

## 2018-12-27 NOTE — Progress Notes (Signed)
[facility] [address] [place], [place] [address]  Pulmonary Sleep Medicine   Office Visit Note  Patient Name: Savannah [name] DOB: 11/11/1985 MRN [id number]  Date of Service: 12/27/2018  Complaints/HPI: Pt is here for follow up. All of her tests are normal at this time. She continues to complain of fatigue at this time.  Encouraged patient to follow up with PCP, and fatigue labs are ordered.     ROS  General: (-) fever, (-) chills, (-) night sweats, (-) weakness Skin: (-) rashes, (-) itching,. Eyes: (-) visual changes, (-) redness, (-) itching. Nose and Sinuses: (-) nasal stuffiness or itchiness, (-) postnasal drip, (-) nosebleeds, (-) sinus trouble. Mouth and Throat: (-) sore throat, (-) hoarseness. Neck: (-) swollen glands, (-) enlarged thyroid, (-) neck pain. Respiratory: - cough, (-) bloody sputum, - shortness of breath, - wheezing. Cardiovascular: - ankle swelling, (-) chest pain. Lymphatic: (-) lymph node enlargement. Neurologic: (-) numbness, (-) tingling. Psychiatric: (-) anxiety, (-) depression   Current Medication: Outpatient Encounter Medications as of 12/27/2018  Medication Sig  . clotrimazole-betamethasone (LOTRISONE) cream Apply 1 application topically 2 (two) times daily.  . ergocalciferol (DRISDOL) 1.25 MG (50000 UT) capsule Take 1 capsule (50,000 Units total) by mouth once a week.  Marland Kitchen. levonorgestrel (MIRENA) 20 MCG/24HR IUD 1 each once by Intrauterine route.  Marland Kitchen. omeprazole (PRILOSEC) 20 MG capsule TAKE 1 CAPSULE BY MOUTH EVERY DAY  . triamcinolone (KENALOG) 0.025 % cream Apply 1 application topically 2 (two) times daily.   No facility-administered encounter medications on file as of 12/27/2018.     Surgical History: Past Surgical History:  Procedure Laterality Date  . CARPAL TUNNEL RELEASE Right 09/29/2014   Procedure: CARPAL TUNNEL RELEASE;  Surgeon: Myra Rudehristopher Smith, MD;  Location: [facility] ORS;  Service: Orthopedics;  Laterality: Right;  .  CESAREAN SECTION    . IUD placement    . MYRINGOTOMY WITH TUBE PLACEMENT      Medical History: Past Medical History:  Diagnosis Date  . Anxiety   . Depression   . Eardrum rupture, left   . Headache     Family History: Family History  Problem Relation Age of Onset  . Bone cancer Maternal Grandfather   . Healthy Mother     Social History: Social History   Socioeconomic History  . Marital status: Married    Spouse name: Not on file  . Number of children: Not on file  . Years of education: Not on file  . Highest education level: Not on file  Occupational History  . Not on file  Social Needs  . Financial resource strain: Not on file  . Food insecurity    Worry: Not on file    Inability: Not on file  . Transportation needs    Medical: Not on file    Non-medical: Not on file  Tobacco Use  . Smoking status: Former Smoker    Packs/day: 0.25    Types: Cigarettes    Quit date: 06/14/2018    Years since quitting: 0.5  . Smokeless tobacco: Never Used  Substance and Sexual Activity  . Alcohol use: Not Currently  . Drug use: No  . Sexual activity: Yes    Birth control/protection: I.U.D.  Lifestyle  . Physical activity    Days per week: 0 days    Minutes per session: 0 min  . Stress: To some extent  Relationships  . Social Musicianconnections    Talks on phone: Three times a week    Gets  together: Once a week    Attends religious service: Never    Active member of club or organization: No    Attends meetings of clubs or organizations: Never    Relationship status: Living with partner  . Intimate partner violence    Fear of current or ex partner: No    Emotionally abused: No    Physically abused: No    Forced sexual activity: No  Other Topics Concern  . Not on file  Social History Narrative  . Not on file    Vital Signs: Blood pressure 120/84, pulse 99, resp. rate 16, height 5\' 3"  (1.6 m), weight 190 lb (86.2 kg), SpO2 98 %.  Examination: General Appearance: The  patient is well-developed, well-nourished, and in no distress. Skin: Gross inspection of skin unremarkable. Head: normocephalic, no gross deformities. Eyes: no gross deformities noted. ENT: ears appear grossly normal no exudates. Neck: Supple. No thyromegaly. No LAD. Respiratory: clear bilaterally. Cardiovascular: Normal S1 and S2 without murmur or rub. Extremities: No cyanosis. pulses are equal. Neurologic: Alert and oriented. No involuntary movements.  LABS: Recent Results (from the past 2160 hour(s))  Pulmonary Function Test     Status: None   Collection Time: 10/20/18  9:00 AM  Result Value Ref Range   FEV1     FVC     FEV1/FVC     TLC     DLCO      Radiology: Dg Chest 2 View  Result Date: 10/20/2018 CLINICAL DATA:  Shortness of breath EXAM: CHEST - 2 VIEW COMPARISON:  None. FINDINGS: The heart size and mediastinal contours are within normal limits. Both lungs are clear. The visualized skeletal structures are unremarkable. IMPRESSION: No acute abnormality of the lungs. Electronically Signed   By: Eddie Candle M.D.   On: 10/20/2018 14:20    No results found.  No results found.    Assessment and Plan: Patient Active Problem List   Diagnosis Date Noted  . Excessive daytime sleepiness 08/29/2018  . Fatigue 08/29/2018  . Atopic dermatitis 08/17/2018  . Gastroesophageal reflux disease without esophagitis 08/17/2018  . Vitamin D deficiency 08/17/2018  . Carpal tunnel syndrome 06/01/2018  . Bacterial folliculitis 70/26/3785  . Irritable bowel syndrome with both constipation and diarrhea 06/01/2018  . Genital warts 03/26/2017  . Depression 01/11/2015  . Psoriasis 01/11/2015  . Ankle pain 01/11/2015    1. Fatigue, unspecified type Ordered fatigue labs. And will have patient follow up with PCP.   2. Gastroesophageal reflux disease without esophagitis Stable continue present management.   3. Class 1 obesity due to excess calories without serious comorbidity with body  mass index (BMI) of 33.0 to 33.9 in adult Obesity Counseling: Risk Assessment: An assessment of behavioral risk factors was made today and includes lack of exercise sedentary lifestyle, lack of portion control and poor dietary habits.  Risk Modification Advice: She was counseled on portion control guidelines. Restricting daily caloric intake to. . The detrimental long term effects of obesity on her health and ongoing poor compliance was also discussed with the patient.    General Counseling: I have discussed the findings of the evaluation and examination with Caryl Pina.  I have also discussed any further diagnostic evaluation thatmay be needed or ordered today. Loyola verbalizes understanding of the findings of todays visit. We also reviewed her medications today and discussed drug interactions and side effects including but not limited excessive drowsiness and altered mental states. We also discussed that there is always a risk not just to  her but also people around her. she has been encouraged to call the office with any questions or concerns that should arise related to todays visit.    Time spent: 15 This patient was seen by Blima LedgerAdam Nova Schmuhl AGNP-C in Collaboration with Dr. Freda MunroSaadat Khan as a part of collaborative care agreement.   I have personally obtained a history, examined the patient, evaluated laboratory and imaging results, formulated the assessment and plan and placed orders.    Yevonne PaxSaadat A Khan, MD [facility] Pulmonary and Critical Care Sleep medicine

## 2018-12-29 ENCOUNTER — Other Ambulatory Visit: Payer: Self-pay | Admitting: Adult Health

## 2018-12-30 LAB — IRON AND TIBC
Iron Saturation: 33 % (ref 15–55)
Iron: 113 ug/dL (ref 27–159)
Total Iron Binding Capacity: 347 ug/dL (ref 250–450)
UIBC: 234 ug/dL (ref 131–425)

## 2018-12-30 LAB — T4, FREE: Free T4: 0.87 ng/dL (ref 0.82–1.77)

## 2018-12-30 LAB — FERRITIN: Ferritin: 146 ng/mL (ref 15–150)

## 2018-12-30 LAB — TSH: TSH: 2.34 u[IU]/mL (ref 0.450–4.500)

## 2018-12-30 LAB — VITAMIN D 25 HYDROXY (VIT D DEFICIENCY, FRACTURES): Vit D, 25-Hydroxy: 44.3 ng/mL (ref 30.0–100.0)

## 2018-12-30 LAB — B12 AND FOLATE PANEL
Folate: 19 ng/mL (ref >3.0)
Vitamin B-12: 556 pg/mL (ref 232–1245)

## 2019-01-22 ENCOUNTER — Other Ambulatory Visit: Payer: Self-pay | Admitting: Adult Health

## 2019-01-22 DIAGNOSIS — E559 Vitamin D deficiency, unspecified: Secondary | ICD-10-CM

## 2019-01-25 ENCOUNTER — Encounter: Payer: Self-pay | Admitting: Nurse Practitioner

## 2019-01-25 ENCOUNTER — Other Ambulatory Visit: Payer: Self-pay

## 2019-01-25 ENCOUNTER — Ambulatory Visit: Payer: Commercial Managed Care - PPO | Admitting: Nurse Practitioner

## 2019-01-25 VITALS — BP 114/79 | HR 82 | Temp 97.9°F | Resp 16 | Ht 63.0 in | Wt 189.4 lb

## 2019-01-25 DIAGNOSIS — R5383 Other fatigue: Secondary | ICD-10-CM | POA: Diagnosis not present

## 2019-01-25 DIAGNOSIS — G8929 Other chronic pain: Secondary | ICD-10-CM | POA: Diagnosis not present

## 2019-01-25 DIAGNOSIS — M545 Low back pain: Secondary | ICD-10-CM | POA: Diagnosis not present

## 2019-01-25 DIAGNOSIS — R10817 Generalized abdominal tenderness: Secondary | ICD-10-CM | POA: Diagnosis not present

## 2019-01-25 NOTE — Progress Notes (Signed)
[facility] [address] [place], [place] [address]  Internal MEDICINE  Office Visit Note  Patient Name: Savannah Booksshley Loper  [birth year]  [id number]  Date of Service: 02/03/2019  Chief Complaint  Patient presents with  . Follow-up    labs  . Gastroesophageal Reflux  . Fatigue  . Pain    2 discs in spine that are disintegrating, hand pain on the hand that pt had surgery and swelling, also knee/leg pain    The patient is here for follow up of labs. Recently checked full iron and thyroid panels. All were normal. Vitamin d was normal. She states that she is severely tired at all times. Continues to have shortness of breath with exertion. She did have PFT. This was normal. She had home sleep test which did show some episodes of oxygen desaturations. She had echocardiogram done which was normal. She has not been smoking since February. She states that she also has chronic back pain. Had x-ray done in 2017 at [facility]. Did show mild degenerative disc disease in L5/S1. A connective tissue panel has not been done yet.       Current Medication: Outpatient Encounter Medications as of 01/25/2019  Medication Sig  . clotrimazole-betamethasone (LOTRISONE) cream Apply 1 application topically 2 (two) times daily.  Marland Kitchen. levonorgestrel (MIRENA) 20 MCG/24HR IUD 1 each once by Intrauterine route.  Marland Kitchen. omeprazole (PRILOSEC) 20 MG capsule TAKE 1 CAPSULE BY MOUTH EVERY DAY  . triamcinolone (KENALOG) 0.025 % cream Apply 1 application topically 2 (two) times daily.  . Vitamin D, Ergocalciferol, (DRISDOL) 1.25 MG (50000 UT) CAPS capsule TAKE 1 CAPSULE BY MOUTH ONE TIME PER WEEK   No facility-administered encounter medications on file as of 01/25/2019.     Surgical History: Past Surgical History:  Procedure Laterality Date  . CARPAL TUNNEL RELEASE Right 09/29/2014   Procedure: CARPAL TUNNEL RELEASE;  Surgeon: Myra Rudehristopher Smith, MD;  Location: [facility] ORS;  Service: Orthopedics;  Laterality: Right;  .  CESAREAN SECTION    . IUD placement    . MYRINGOTOMY WITH TUBE PLACEMENT      Medical History: Past Medical History:  Diagnosis Date  . Anxiety   . Depression   . Eardrum rupture, left   . Headache     Family History: Family History  Problem Relation Age of Onset  . Bone cancer Maternal Grandfather   . Healthy Mother     Social History   Socioeconomic History  . Marital status: Married    Spouse name: Not on file  . Number of children: Not on file  . Years of education: Not on file  . Highest education level: Not on file  Occupational History  . Not on file  Social Needs  . Financial resource strain: Not on file  . Food insecurity    Worry: Not on file    Inability: Not on file  . Transportation needs    Medical: Not on file    Non-medical: Not on file  Tobacco Use  . Smoking status: Former Smoker    Packs/day: 0.25    Types: Cigarettes    Quit date: 06/14/2018    Years since quitting: 0.6  . Smokeless tobacco: Never Used  Substance and Sexual Activity  . Alcohol use: Not Currently  . Drug use: No  . Sexual activity: Yes    Birth control/protection: I.U.D.  Lifestyle  . Physical activity    Days per week: 0 days    Minutes per session: 0 min  .  Stress: To some extent  Relationships  . Social Musician on phone: Three times a week    Gets together: Once a week    Attends religious service: Never    Active member of club or organization: No    Attends meetings of clubs or organizations: Never    Relationship status: Living with partner  . Intimate partner violence    Fear of current or ex partner: No    Emotionally abused: No    Physically abused: No    Forced sexual activity: No  Other Topics Concern  . Not on file  Social History Narrative  . Not on file      Review of Systems  Constitutional: Positive for fatigue. Negative for chills and unexpected weight change.       Fatigue is severe and overwhelming.   HENT: Negative for  congestion, rhinorrhea, sneezing and sore throat.   Respiratory: Negative for cough, chest tightness and shortness of breath.   Cardiovascular: Negative for chest pain and palpitations.  Gastrointestinal: Positive for constipation and diarrhea. Negative for abdominal pain, nausea and vomiting.       Generalized abdominal tenderness especially after eating. Feels bloated and has excess gas. Cannot think of any foods in particular which makes this worse or better.   Endocrine: Negative for cold intolerance, heat intolerance, polydipsia and polyuria.  Musculoskeletal: Positive for arthralgias and myalgias. Negative for back pain, joint swelling and neck pain.       Shoulder/ back pain.  Hx of DDD  Skin: Negative for rash.  Allergic/Immunologic: Negative for environmental allergies.  Neurological: Positive for headaches. Negative for dizziness, tremors and numbness.  Hematological: Negative for adenopathy. Does not bruise/bleed easily.  Psychiatric/Behavioral: Positive for dysphoric mood. Negative for behavioral problems and sleep disturbance. The patient is nervous/anxious.     Today's Vitals   01/25/19 1044  BP: 114/79  Pulse: 82  Resp: 16  Temp: 97.9 F (36.6 C)  SpO2: 99%  Weight: 189 lb 6.4 oz (85.9 kg)  Height: 5\' 3"  (1.6 m)   Body mass index is 33.55 kg/m.  Physical Exam Vitals signs and nursing note reviewed.  Constitutional:      General: She is not in acute distress.    Appearance: Normal appearance. She is well-developed. She is not diaphoretic.  HENT:     Head: Normocephalic and atraumatic.     Nose: Nose normal.     Mouth/Throat:     Pharynx: No oropharyngeal exudate.  Eyes:     Pupils: Pupils are equal, round, and reactive to light.  Neck:     Musculoskeletal: Normal range of motion and neck supple.     Thyroid: No thyromegaly.     Vascular: No JVD.     Trachea: No tracheal deviation.  Cardiovascular:     Rate and Rhythm: Normal rate and regular rhythm.      Heart sounds: Normal heart sounds. No murmur. No friction rub. No gallop.   Pulmonary:     Effort: Pulmonary effort is normal. No respiratory distress.     Breath sounds: Normal breath sounds. No wheezing or rales.  Chest:     Chest wall: No tenderness.  Abdominal:     General: Bowel sounds are normal.     Palpations: Abdomen is soft. There is no mass.     Tenderness: There is abdominal tenderness. There is no guarding.     Hernia: No hernia is present.  Musculoskeletal: Normal range of motion.  Comments: There is moderate lower back pain which is more severe with bending and twisting at the waist. Pain is also more severe with pushing, pulling. And lifting with the arms. No visible abnormalities or deformities are present.   Lymphadenopathy:     Cervical: No cervical adenopathy.  Skin:    General: Skin is warm and dry.     Capillary Refill: Capillary refill takes less than 2 seconds.  Neurological:     Mental Status: She is alert and oriented to person, place, and time.     Cranial Nerves: No cranial nerve deficit.  Psychiatric:        Behavior: Behavior normal.        Thought Content: Thought content normal.        Judgment: Judgment normal.   Assessment/Plan: 1. Generalized abdominal tenderness without rebound tenderness Will get abdominal ultrasound for further evaluation of abdominal pain. Referral to be made as indicated.  - US Abdomen Complete; Future  2. Chronic low back pain, unspecified back pain laterality, unspecified whether sciatica present X-ray lumbar spine for further evaluation.  - DG Lumbar Spine Complete; Future  3. Other fatigue Check labs including connective tissue panel for further evaluation.   General Counseling: Savannah [name] understanding of the findings of todays visit and agrees with plan of treatment. I have discussed any further diagnostic evaluation that may be needed or ordered today. We also reviewed her medications today. she has been  encouraged to call the office with any questions or concerns that should arise related to todays visit.  This patient was seen by Leretha Pol FNP Collaboration with Dr Lavera Guise as a part of collaborative care agreement  Orders Placed This Encounter  Procedures  . DG Lumbar Spine Complete  . US Abdomen Complete     Time spent: 25 Minutes      Dr Lavera Guise Internal medicine

## 2019-01-26 ENCOUNTER — Other Ambulatory Visit: Payer: Self-pay

## 2019-01-26 ENCOUNTER — Ambulatory Visit
Admission: RE | Admit: 2019-01-26 | Discharge: 2019-01-26 | Disposition: A | Discharge status: Home / Self Care | Payer: Commercial Managed Care - PPO | Source: Ambulatory Visit | Attending: Nurse Practitioner | Admitting: Nurse Practitioner

## 2019-01-26 DIAGNOSIS — G8929 Other chronic pain: Secondary | ICD-10-CM | POA: Insufficient documentation

## 2019-01-26 DIAGNOSIS — M545 Low back pain, unspecified: Secondary | ICD-10-CM

## 2019-01-28 ENCOUNTER — Other Ambulatory Visit: Payer: Self-pay | Admitting: Nurse Practitioner

## 2019-01-29 LAB — RHEUMATOID FACTOR: Rheumatoid fact SerPl-aCnc: <10 IU/mL (ref 0.0–13.9)

## 2019-01-29 LAB — ANA W/REFLEX IF POSITIVE: Anti Nuclear Antibody (ANA): NEGATIVE

## 2019-01-29 LAB — SEDIMENTATION RATE: Sed Rate: 2 mm/hr (ref 0–32)

## 2019-01-29 LAB — URIC ACID: Uric Acid: 4.3 mg/dL (ref 2.5–7.1)

## 2019-02-01 NOTE — Progress Notes (Signed)
Normal labs. Discuss with patient at next visit .

## 2019-02-03 DIAGNOSIS — G8929 Other chronic pain: Secondary | ICD-10-CM | POA: Insufficient documentation

## 2019-02-03 DIAGNOSIS — R10817 Generalized abdominal tenderness: Secondary | ICD-10-CM | POA: Insufficient documentation

## 2019-02-09 ENCOUNTER — Other Ambulatory Visit: Payer: Self-pay

## 2019-02-09 ENCOUNTER — Encounter: Payer: Self-pay | Admitting: Internal Medicine

## 2019-02-09 ENCOUNTER — Ambulatory Visit: Payer: Commercial Managed Care - PPO | Admitting: Internal Medicine

## 2019-02-09 VITALS — BP 124/89 | HR 90 | Resp 16 | Ht 63.0 in | Wt 188.0 lb

## 2019-02-09 DIAGNOSIS — F419 Anxiety disorder, unspecified: Secondary | ICD-10-CM | POA: Diagnosis not present

## 2019-02-09 DIAGNOSIS — R5383 Other fatigue: Secondary | ICD-10-CM

## 2019-02-09 DIAGNOSIS — G4719 Other hypersomnia: Secondary | ICD-10-CM

## 2019-02-09 MED ORDER — ESCITALOPRAM OXALATE 10 MG PO TABS: 10.0000 mg | ORAL_TABLET | Freq: Every day | ORAL | 1 refills | Status: DC

## 2019-02-09 NOTE — Progress Notes (Signed)
[facility] [address] [place], [place] [address]  Pulmonary Sleep Medicine   Office Visit Note  Patient Name: Savannah [name] DOB: 07-09-1985 MRN [id number]  Date of Service: 02/09/2019  Complaints/HPI: Pt is here for follow up.  She has had multiple sleep studies and overnight oximetry. Overall she does not appear to have a need for nocturnal oxygen.        ROS  General: (-) fever, (-) chills, (-) night sweats, (-) weakness Skin: (-) rashes, (-) itching,. Eyes: (-) visual changes, (-) redness, (-) itching. Nose and Sinuses: (-) nasal stuffiness or itchiness, (-) postnasal drip, (-) nosebleeds, (-) sinus trouble. Mouth and Throat: (-) sore throat, (-) hoarseness. Neck: (-) swollen glands, (-) enlarged thyroid, (-) neck pain. Respiratory: - cough, (-) bloody sputum, - shortness of breath, - wheezing. Cardiovascular: - ankle swelling, (-) chest pain. Lymphatic: (-) lymph node enlargement. Neurologic: (-) numbness, (-) tingling. Psychiatric: (-) anxiety, (-) depression   Current Medication: Outpatient Encounter Medications as of 02/09/2019  Medication Sig  . clotrimazole-betamethasone (LOTRISONE) cream Apply 1 application topically 2 (two) times daily.  Marland Kitchen levonorgestrel (MIRENA) 20 MCG/24HR IUD 1 each once by Intrauterine route.  Marland Kitchen omeprazole (PRILOSEC) 20 MG capsule TAKE 1 CAPSULE BY MOUTH EVERY DAY  . triamcinolone (KENALOG) 0.025 % cream Apply 1 application topically 2 (two) times daily.  . Vitamin D, Ergocalciferol, (DRISDOL) 1.25 MG (50000 UT) CAPS capsule TAKE 1 CAPSULE BY MOUTH ONE TIME PER WEEK   No facility-administered encounter medications on file as of 02/09/2019.     Surgical History: Past Surgical History:  Procedure Laterality Date  . CARPAL TUNNEL RELEASE Right 09/29/2014   Procedure: CARPAL TUNNEL RELEASE;  Surgeon: Myra Rude, MD;  Location: [facility] ORS;  Service: Orthopedics;  Laterality: Right;  . CESAREAN SECTION    . IUD placement     . MYRINGOTOMY WITH TUBE PLACEMENT      Medical History: Past Medical History:  Diagnosis Date  . Anxiety   . Depression   . Eardrum rupture, left   . Headache     Family History: Family History  Problem Relation Age of Onset  . Bone cancer Maternal Grandfather   . Healthy Mother     Social History: Social History   Socioeconomic History  . Marital status: Married    Spouse name: Not on file  . Number of children: Not on file  . Years of education: Not on file  . Highest education level: Not on file  Occupational History  . Not on file  Social Needs  . Financial resource strain: Not on file  . Food insecurity    Worry: Not on file    Inability: Not on file  . Transportation needs    Medical: Not on file    Non-medical: Not on file  Tobacco Use  . Smoking status: Former Smoker    Packs/day: 0.25    Types: Cigarettes    Quit date: 06/14/2018    Years since quitting: 0.6  . Smokeless tobacco: Never Used  Substance and Sexual Activity  . Alcohol use: Not Currently  . Drug use: No  . Sexual activity: Yes    Birth control/protection: I.U.D.  Lifestyle  . Physical activity    Days per week: 0 days    Minutes per session: 0 min  . Stress: To some extent  Relationships  . Social Musician on phone: Three times a week    Gets together: Once a week  Attends religious service: Never    Active member of club or organization: No    Attends meetings of clubs or organizations: Never    Relationship status: Living with partner  . Intimate partner violence    Fear of current or ex partner: No    Emotionally abused: No    Physically abused: No    Forced sexual activity: No  Other Topics Concern  . Not on file  Social History Narrative  . Not on file    Vital Signs: Blood pressure 124/89, pulse 90, resp. rate 16, height 5\' 3"  (1.6 m), weight 188 lb (85.3 kg), SpO2 98 %.  Examination: General Appearance: The patient is well-developed,  well-nourished, and in no distress. Skin: Gross inspection of skin unremarkable. Head: normocephalic, no gross deformities. Eyes: no gross deformities noted. ENT: ears appear grossly normal no exudates. Neck: Supple. No thyromegaly. No LAD. Respiratory: clear bilaterally. Cardiovascular: Normal S1 and S2 without murmur or rub. Extremities: No cyanosis. pulses are equal. Neurologic: Alert and oriented. No involuntary movements.  LABS: Recent Results (from the past 2160 hour(s))  Iron and TIBC     Status: None   Collection Time: 12/29/18  9:40 AM  Result Value Ref Range   Total Iron Binding Capacity 347 250 - 450 ug/dL   UIBC 234 131 - 425 ug/dL   Iron 113 27 - 159 ug/dL   Iron Saturation 33 15 - 55 %  B12 and Folate Panel     Status: None   Collection Time: 12/29/18  9:40 AM  Result Value Ref Range   Vitamin B-12 556 232 - 1,245 pg/mL   Folate 19.0 >3.0 ng/mL    Comment: A serum folate concentration of less than 3.1 ng/mL is considered to represent clinical deficiency.   T4, free     Status: None   Collection Time: 12/29/18  9:40 AM  Result Value Ref Range   Free T4 0.87 0.82 - 1.77 ng/dL  TSH     Status: None   Collection Time: 12/29/18  9:40 AM  Result Value Ref Range   TSH 2.340 0.450 - 4.500 uIU/mL  VITAMIN D 25 Hydroxy (Vit-D Deficiency, Fractures)     Status: None   Collection Time: 12/29/18  9:40 AM  Result Value Ref Range   Vit D, 25-Hydroxy 44.3 30.0 - 100.0 ng/mL    Comment: Vitamin D deficiency has been defined by the [place] and an Endocrine Society practice guideline as a level of serum 25-OH vitamin D less than 20 ng/mL (1,2). The Endocrine Society went on to further define vitamin D insufficiency as a level between 21 and 29 ng/mL (2). 1. IOM (Institute of Medicine). 2010. Dietary reference    intakes for calcium and D. [place]: The    [employer]. 2. Holick MF, Binkley [place], Bischoff-Ferrari HA, et al.    Evaluation,  treatment, and prevention of vitamin D    deficiency: an Endocrine Society clinical practice    guideline. JCEM. 2011 Jul; 96(7):1911-30.   Ferritin     Status: None   Collection Time: 12/29/18  9:40 AM  Result Value Ref Range   Ferritin 146 15 - 150 ng/mL  Rheumatoid factor     Status: None   Collection Time: 01/28/19 11:20 AM  Result Value Ref Range   Rhuematoid fact SerPl-aCnc <10.0 0.0 - 13.9 IU/mL  Uric acid     Status: None   Collection Time: 01/28/19 11:20 AM  Result Value Ref Range  Uric Acid 4.3 2.5 - 7.1 mg/dL    Comment:            Therapeutic target for gout patients: <6.0  ANA w/Reflex if Positive     Status: None   Collection Time: 01/28/19 11:20 AM  Result Value Ref Range   Anti Nuclear Antibody (ANA) Negative Negative  Sedimentation rate     Status: None   Collection Time: 01/28/19 11:20 AM  Result Value Ref Range   Sed Rate 2 0 - 32 mm/hr    Radiology: Dg Lumbar Spine Complete  Result Date: 01/26/2019 CLINICAL DATA:  Chronic back pain EXAM: LUMBAR SPINE - COMPLETE 4+ VIEW COMPARISON:  None. FINDINGS: There is no evidence of lumbar spine fracture. Alignment is normal. Intervertebral disc spaces are maintained. IUD in the pelvis IMPRESSION: Negative. Electronically Signed   By: Jasmine PangKim  Fujinaga M.D.   On: 01/26/2019 20:53    No results found.  Dg Lumbar Spine Complete  Result Date: 01/26/2019 CLINICAL DATA:  Chronic back pain EXAM: LUMBAR SPINE - COMPLETE 4+ VIEW COMPARISON:  None. FINDINGS: There is no evidence of lumbar spine fracture. Alignment is normal. Intervertebral disc spaces are maintained. IUD in the pelvis IMPRESSION: Negative. Electronically Signed   By: Jasmine PangKim  Fujinaga M.D.   On: 01/26/2019 20:53      Assessment and Plan: Patient Active Problem List   Diagnosis Date Noted  . Generalized abdominal tenderness without rebound tenderness 02/03/2019  . Chronic low back pain 02/03/2019  . Excessive daytime sleepiness 08/29/2018  . Fatigue 08/29/2018   . Atopic dermatitis 08/17/2018  . Gastroesophageal reflux disease without esophagitis 08/17/2018  . Vitamin D deficiency 08/17/2018  . Carpal tunnel syndrome 06/01/2018  . Bacterial folliculitis 06/01/2018  . Irritable bowel syndrome with both constipation and diarrhea 06/01/2018  . Genital warts 03/26/2017  . Depression 01/11/2015  . Psoriasis 01/11/2015  . Ankle pain 01/11/2015    1. Excessive daytime sleepiness Stable, we discussed that it could be related to her depression/anxiety.  Will start lexapro, and follow up with patient.   2. Anxiety Start patient on lexapro as discussed.  - escitalopram (LEXAPRO) 10 MG tablet; Take 1 tablet (10 mg total) by mouth at bedtime.  Dispense: 30 tablet; Refill: 1  3. Fatigue, unspecified type Ongoing, continue to follow.  General Counseling: I have discussed the findings of the evaluation and examination with Morrie SheldonAshley.  I have also discussed any further diagnostic evaluation thatmay be needed or ordered today. Morrie Sheldonshley verbalizes understanding of the findings of todays visit. We also reviewed her medications today and discussed drug interactions and side effects including but not limited excessive drowsiness and altered mental states. We also discussed that there is always a risk not just to her but also people around her. she has been encouraged to call the office with any questions or concerns that should arise related to todays visit.    Time spent: 15 This patient was seen by Blima LedgerAdam Bently Morath AGNP-C in Collaboration with Dr. Freda MunroSaadat Khan as a part of collaborative care agreement.   I have personally obtained a history, examined the patient, evaluated laboratory and imaging results, formulated the assessment and plan and placed orders.    Yevonne PaxSaadat A Khan, MD [facility] Pulmonary and Critical [facility] Sleep medicine

## 2019-02-11 ENCOUNTER — Other Ambulatory Visit: Payer: Self-pay

## 2019-02-11 ENCOUNTER — Ambulatory Visit: Payer: Commercial Managed Care - PPO

## 2019-02-11 DIAGNOSIS — R10817 Generalized abdominal tenderness: Secondary | ICD-10-CM

## 2019-02-18 ENCOUNTER — Encounter: Payer: Self-pay | Admitting: Nurse Practitioner

## 2019-02-18 ENCOUNTER — Other Ambulatory Visit: Payer: Self-pay | Admitting: Adult Health

## 2019-02-18 ENCOUNTER — Other Ambulatory Visit: Payer: Self-pay

## 2019-02-18 ENCOUNTER — Ambulatory Visit: Payer: Commercial Managed Care - PPO | Admitting: Nurse Practitioner

## 2019-02-18 ENCOUNTER — Telehealth: Payer: Self-pay

## 2019-02-18 VITALS — BP 125/91 | HR 80 | Temp 97.7°F | Resp 16 | Ht 63.0 in | Wt 189.0 lb

## 2019-02-18 DIAGNOSIS — G4726 Circadian rhythm sleep disorder, shift work type: Secondary | ICD-10-CM

## 2019-02-18 DIAGNOSIS — R10817 Generalized abdominal tenderness: Secondary | ICD-10-CM | POA: Diagnosis not present

## 2019-02-18 DIAGNOSIS — L209 Atopic dermatitis, unspecified: Secondary | ICD-10-CM | POA: Diagnosis not present

## 2019-02-18 DIAGNOSIS — M545 Low back pain, unspecified: Secondary | ICD-10-CM

## 2019-02-18 DIAGNOSIS — G8929 Other chronic pain: Secondary | ICD-10-CM

## 2019-02-18 DIAGNOSIS — E559 Vitamin D deficiency, unspecified: Secondary | ICD-10-CM

## 2019-02-18 MED ORDER — EUCRISA 2 % EX OINT: 1.0000 "application " | TOPICAL_OINTMENT | Freq: Two times a day (BID) | CUTANEOUS | 2 refills | Status: DC

## 2019-02-18 MED ORDER — MODAFINIL 100 MG PO TABS: 100.0000 mg | ORAL_TABLET | Freq: Every day | ORAL | 1 refills | Status: DC

## 2019-02-18 NOTE — Telephone Encounter (Signed)
Ok to fill 

## 2019-02-18 NOTE — Progress Notes (Signed)
[facility] [place], [place] [address]  Internal MEDICINE  Office Visit Note  Patient Name: Savannah [name]  [id number]  [id number]  Date of Service: 02/27/2019  Chief Complaint  Patient presents with  . Follow-up    ultrasound, labs, and xray results  . Ear Problem    feels as if there is ear pressure, would like for you to check her ears  . Medication Management    would like to talk about anxiety meds, makes pt feel very nauseous, slept for 10 hours wed and yesterday not at all, makes her have sweats    The patient is here for follow up visit. She continues to have excess daytime sleepiness. She works night shift. Testing for sleep apnea was essentially normal. She continues to have lower back pain. Recent x-ray of the lumbar spine was normal. She had also been having intermittent abdominal pain. Worse after eating. She did have ultrasound prior to this visit and her results were normal..       Current Medication: Outpatient Encounter Medications as of 02/18/2019  Medication Sig  . escitalopram (LEXAPRO) 10 MG tablet Take 1 tablet (10 mg total) by mouth at bedtime.  Marland Kitchen levonorgestrel (MIRENA) 20 MCG/24HR IUD 1 each once by Intrauterine route.  Marland Kitchen omeprazole (PRILOSEC) 20 MG capsule TAKE 1 CAPSULE BY MOUTH EVERY DAY  . [DISCONTINUED] clotrimazole-betamethasone (LOTRISONE) cream Apply 1 application topically 2 (two) times daily.  . [DISCONTINUED] triamcinolone (KENALOG) 0.025 % cream Apply 1 application topically 2 (two) times daily.  . [DISCONTINUED] Vitamin D, Ergocalciferol, (DRISDOL) 1.25 MG (50000 UT) CAPS capsule TAKE 1 CAPSULE BY MOUTH ONE TIME PER WEEK  . Crisaborole (EUCRISA) 2 % OINT Apply 1 application topically 2 (two) times daily.  . modafinil (PROVIGIL) 100 MG tablet Take 1 tablet (100 mg total) by mouth daily.   No facility-administered encounter medications on file as of 02/18/2019.     Surgical History: Past Surgical History:   Procedure Laterality Date  . CARPAL TUNNEL RELEASE Right 09/29/2014   Procedure: CARPAL TUNNEL RELEASE;  Surgeon: Christophe Louis, MD;  Location: [facility] ORS;  Service: Orthopedics;  Laterality: Right;  . CESAREAN SECTION    . IUD placement    . MYRINGOTOMY WITH TUBE PLACEMENT      Medical History: Past Medical History:  Diagnosis Date  . Anxiety   . Depression   . Eardrum rupture, left   . Headache     Family History: Family History  Problem Relation Age of Onset  . Bone cancer Maternal Grandfather   . Healthy Mother     Social History   Socioeconomic History  . Marital status: Married    Spouse name: Not on file  . Number of children: Not on file  . Years of education: Not on file  . Highest education level: Not on file  Occupational History  . Not on file  Social Needs  . Financial resource strain: Not on file  . Food insecurity    Worry: Not on file    Inability: Not on file  . Transportation needs    Medical: Not on file    Non-medical: Not on file  Tobacco Use  . Smoking status: Former Smoker    Packs/day: 0.25    Types: Cigarettes    Quit date: 06/14/2018    Years since quitting: 0.7  . Smokeless tobacco: Never Used  Substance and Sexual Activity  . Alcohol use: Not Currently  . Drug use: No  .  Sexual activity: Yes    Birth control/protection: I.U.D.  Lifestyle  . Physical activity    Days per week: 0 days    Minutes per session: 0 min  . Stress: To some extent  Relationships  . Social Musician on phone: Three times a week    Gets together: Once a week    Attends religious service: Never    Active member of club or organization: No    Attends meetings of clubs or organizations: Never    Relationship status: Living with partner  . Intimate partner violence    Fear of current or ex partner: No    Emotionally abused: No    Physically abused: No    Forced sexual activity: No  Other Topics Concern  . Not on file  Social History  Narrative  . Not on file      Review of Systems  Constitutional: Positive for fatigue. Negative for chills and unexpected weight change.       Fatigue is severe and overwhelming. She works night shift.  HENT: Negative for congestion, rhinorrhea, sneezing and sore throat.   Respiratory: Negative for cough, chest tightness and shortness of breath.   Cardiovascular: Negative for chest pain and palpitations.  Gastrointestinal: Positive for constipation and diarrhea. Negative for abdominal pain, nausea and vomiting.       Generalized abdominal tenderness especially after eating. Feels bloated and has excess gas. Cannot think of any foods in particular which makes this worse or better.   Endocrine: Negative for cold intolerance, heat intolerance, polydipsia and polyuria.  Musculoskeletal: Positive for arthralgias and myalgias. Negative for back pain, joint swelling and neck pain.       Shoulder/ back pain.  Hx of DDD  Skin: Negative for rash.  Allergic/Immunologic: Negative for environmental allergies.  Neurological: Positive for headaches. Negative for dizziness, tremors and numbness.  Hematological: Negative for adenopathy. Does not bruise/bleed easily.  Psychiatric/Behavioral: Positive for dysphoric mood. Negative for behavioral problems and sleep disturbance. The patient is nervous/anxious.     Today's Vitals   02/18/19 1015  BP: (!) 125/91  Pulse: 80  Resp: 16  Temp: 97.7 F (36.5 C)  SpO2: 98%  Weight: 189 lb (85.7 kg)  Height: 5\' 3"  (1.6 m)   Body mass index is 33.48 kg/m.  Physical Exam Vitals signs and nursing note reviewed.  Constitutional:      General: She is not in acute distress.    Appearance: Normal appearance. She is well-developed. She is not diaphoretic.  HENT:     Head: Normocephalic and atraumatic.     Nose: Nose normal.     Mouth/Throat:     Pharynx: No oropharyngeal exudate.  Eyes:     Pupils: Pupils are equal, round, and reactive to light.  Neck:      Musculoskeletal: Normal range of motion and neck supple.     Thyroid: No thyromegaly.     Vascular: No JVD.     Trachea: No tracheal deviation.  Cardiovascular:     Rate and Rhythm: Normal rate and regular rhythm.     Heart sounds: Normal heart sounds. No murmur. No friction rub. No gallop.   Pulmonary:     Effort: Pulmonary effort is normal. No respiratory distress.     Breath sounds: Normal breath sounds. No wheezing or rales.  Chest:     Chest wall: No tenderness.  Abdominal:     General: Bowel sounds are normal.     Palpations:  Abdomen is soft. There is no mass.     Tenderness: There is no abdominal tenderness. There is no guarding.     Hernia: No hernia is present.  Musculoskeletal: Normal range of motion.     Comments: There is moderate lower back pain which is more severe with bending and twisting at the waist. Pain is also more severe with pushing, pulling. And lifting with the arms. No visible abnormalities or deformities are present.   Lymphadenopathy:     Cervical: No cervical adenopathy.  Skin:    General: Skin is warm and dry.     Capillary Refill: Capillary refill takes less than 2 seconds.  Neurological:     Mental Status: She is alert and oriented to person, place, and time.     Cranial Nerves: No cranial nerve deficit.  Psychiatric:        Behavior: Behavior normal.        Thought Content: Thought content normal.        Judgment: Judgment normal.    Assessment/Plan: 1. Chronic low back pain, unspecified back pain laterality, unspecified whether sciatica present Reviewed x-ray of lumbar spine with the patient. Results were normal. Recommended exercises to help reduce lower back pain.   2. Generalized abdominal tenderness without rebound tenderness Ultrasound results were normal. Will continue to monitor.   3. Circadian rhythm sleep disorder, shift work type Trial of provigil 100mg  tablets daily. Single 30 day prescription sent to the pharmacy. Reassess at  next visit  - modafinil (PROVIGIL) 100 MG tablet; Take 1 tablet (100 mg total) by mouth daily.  Dispense: 30 tablet; Refill: 1  4. Atopic dermatitis, unspecified type eucrisa may be used twice daily as needed. Refills provided today. - Crisaborole (EUCRISA) 2 % OINT; Apply 1 application topically 2 (two) times daily.  Dispense: 100 g; Refill: 2  General Counseling: Morrie Sheldonshley verbalizes understanding of the findings of todays visit and agrees with plan of treatment. I have discussed any further diagnostic evaluation that may be needed or ordered today. We also reviewed her medications today. she has been encouraged to call the office with any questions or concerns that should arise related to todays visit.  This patient was seen by Vincent GrosHeather Alicha Raspberry FNP Collaboration with Dr Lyndon CodeFozia M Khan as a part of collaborative care agreement  Meds ordered this encounter  Medications  . modafinil (PROVIGIL) 100 MG tablet    Sig: Take 1 tablet (100 mg total) by mouth daily.    Dispense:  30 tablet    Refill:  1    Order Specific Question:   Supervising Provider    Answer:   Lyndon CodeKHAN, FOZIA M [[id number]]  . Crisaborole (EUCRISA) 2 % OINT    Sig: Apply 1 application topically 2 (two) times daily.    Dispense:  100 g    Refill:  2    Order Specific Question:   Supervising Provider    Answer:   Lyndon CodeKHAN, FOZIA M [[id number]]    Time spent: 2225 Minutes      Dr Lyndon CodeFozia M Khan Internal medicine

## 2019-02-18 NOTE — Telephone Encounter (Signed)
Spoke with phar that PA approved for modafinil

## 2019-02-21 ENCOUNTER — Other Ambulatory Visit: Payer: Self-pay

## 2019-02-21 DIAGNOSIS — L209 Atopic dermatitis, unspecified: Secondary | ICD-10-CM

## 2019-02-21 MED ORDER — TRIAMCINOLONE ACETONIDE 0.025 % EX CREA: 1.0000 "application " | TOPICAL_CREAM | Freq: Two times a day (BID) | CUTANEOUS | 2 refills | Status: DC

## 2019-02-21 NOTE — Progress Notes (Signed)
Normal u/s. Reviewed with patient during visit.

## 2019-02-21 NOTE — Progress Notes (Signed)
Reviewed with patient during visit

## 2019-02-27 DIAGNOSIS — G4726 Circadian rhythm sleep disorder, shift work type: Secondary | ICD-10-CM | POA: Insufficient documentation

## 2019-03-18 ENCOUNTER — Encounter: Payer: Self-pay | Admitting: Nurse Practitioner

## 2019-03-18 ENCOUNTER — Other Ambulatory Visit: Payer: Self-pay

## 2019-03-18 ENCOUNTER — Ambulatory Visit: Payer: Commercial Managed Care - PPO | Admitting: Nurse Practitioner

## 2019-03-18 VITALS — BP 110/80 | HR 80 | Temp 97.6°F | Resp 16 | Ht 63.0 in | Wt 189.0 lb

## 2019-03-18 DIAGNOSIS — K219 Gastro-esophageal reflux disease without esophagitis: Secondary | ICD-10-CM | POA: Diagnosis not present

## 2019-03-18 DIAGNOSIS — Z23 Encounter for immunization: Secondary | ICD-10-CM | POA: Diagnosis not present

## 2019-03-18 DIAGNOSIS — G4726 Circadian rhythm sleep disorder, shift work type: Secondary | ICD-10-CM | POA: Diagnosis not present

## 2019-03-18 MED ORDER — TETANUS-DIPHTH-ACELL PERTUSSIS 5-2.5-18.5 LF-MCG/0.5 IM SUSP: 0.5000 mL | Freq: Once | INTRAMUSCULAR | 0 refills | Status: AC

## 2019-03-18 MED ORDER — MODAFINIL 100 MG PO TABS: 100.0000 mg | ORAL_TABLET | Freq: Every day | ORAL | 3 refills | Status: DC

## 2019-03-18 NOTE — Progress Notes (Signed)
[facility] [address] [place], [place] [address]  Internal MEDICINE  Office Visit Note  Patient Name: Savannah [name]  [id number]  [id number]  Date of Service: 03/18/2019  Chief Complaint  Patient presents with  . Follow-up  . Anxiety  . Depression    The patient is here for routine follow up. She was started on modafanil 100mg  daily due to shift work disorder and excessive sleepiness. She has weaned off of her lexapro. Is doing well without this medication. Has more energy and more awake since starting on modafinil. She states that she does have a hard time waking up initially, and sometimes gets a dull headache which lasts for about 10 minutes and resolves on its own. She also states that she gets hot and sweats when the medication first kicks in. She states that she has no other negative side effects from taking this new medication.        Current Medication: Outpatient Encounter Medications as of 03/18/2019  Medication Sig Note  . Crisaborole (EUCRISA) 2 % OINT Apply 1 application topically 2 (two) times daily.   13/10/2018 levonorgestrel (MIRENA) 20 MCG/24HR IUD 1 each once by Intrauterine route.   . modafinil (PROVIGIL) 100 MG tablet Take 1 tablet (100 mg total) by mouth daily.   Marland Kitchen omeprazole (PRILOSEC) 20 MG capsule TAKE 1 CAPSULE BY MOUTH EVERY DAY   . Tdap (BOOSTRIX) 5-2.5-18.5 LF-MCG/0.5 injection Inject 0.5 mLs into the muscle once for 1 dose.   . triamcinolone (KENALOG) 0.025 % cream Apply 1 application topically 2 (two) times daily.   . [DISCONTINUED] escitalopram (LEXAPRO) 10 MG tablet Take 1 tablet (10 mg total) by mouth at bedtime. 03/18/2019: holding off this medication for now.   . [DISCONTINUED] modafinil (PROVIGIL) 100 MG tablet Take 1 tablet (100 mg total) by mouth daily.   . [DISCONTINUED] Vitamin D, Ergocalciferol, (DRISDOL) 1.25 MG (50000 UT) CAPS capsule TAKE 1 CAPSULE BY MOUTH ONE TIME PER WEEK 03/18/2019: recommended OTC vitamin d 2000iu daily   No  facility-administered encounter medications on file as of 03/18/2019.     Surgical History: Past Surgical History:  Procedure Laterality Date  . CARPAL TUNNEL RELEASE Right 09/29/2014   Procedure: CARPAL TUNNEL RELEASE;  Surgeon: 10/01/2014, MD;  Location: [facility] ORS;  Service: Orthopedics;  Laterality: Right;  . CESAREAN SECTION    . IUD placement    . MYRINGOTOMY WITH TUBE PLACEMENT      Medical History: Past Medical History:  Diagnosis Date  . Anxiety   . Depression   . Eardrum rupture, left   . Headache     Family History: Family History  Problem Relation Age of Onset  . Bone cancer Maternal Grandfather   . Healthy Mother     Social History   Socioeconomic History  . Marital status: Married    Spouse name: Not on file  . Number of children: Not on file  . Years of education: Not on file  . Highest education level: Not on file  Occupational History  . Not on file  Social Needs  . Financial resource strain: Not on file  . Food insecurity    Worry: Not on file    Inability: Not on file  . Transportation needs    Medical: Not on file    Non-medical: Not on file  Tobacco Use  . Smoking status: Former Smoker    Packs/day: 0.25    Types: Cigarettes    Quit date: 06/14/2018    Years  since quitting: 0.7  . Smokeless tobacco: Never Used  Substance and Sexual Activity  . Alcohol use: Not Currently  . Drug use: No  . Sexual activity: Yes    Birth control/protection: I.U.D.  Lifestyle  . Physical activity    Days per week: 0 days    Minutes per session: 0 min  . Stress: To some extent  Relationships  . Social Herbalist on phone: Three times a week    Gets together: Once a week    Attends religious service: Never    Active member of club or organization: No    Attends meetings of clubs or organizations: Never    Relationship status: Living with partner  . Intimate partner violence    Fear of current or ex partner: No    Emotionally abused:  No    Physically abused: No    Forced sexual activity: No  Other Topics Concern  . Not on file  Social History Narrative  . Not on file      Review of Systems  Constitutional: Positive for fatigue. Negative for chills and unexpected weight change.       Fatigue is severe and overwhelming. She works night shift.  HENT: Negative for congestion, rhinorrhea, sneezing and sore throat.   Respiratory: Negative for cough, chest tightness, shortness of breath and wheezing.   Cardiovascular: Negative for chest pain and palpitations.  Gastrointestinal: Positive for constipation and diarrhea. Negative for abdominal pain, nausea and vomiting.       Generalized abdominal tenderness especially after eating. Feels bloated and has excess gas. Cannot think of any foods in particular which makes this worse or better. States that, overall, this is improving some.   Endocrine: Negative for cold intolerance, heat intolerance, polydipsia and polyuria.  Musculoskeletal: Positive for arthralgias and myalgias. Negative for back pain, joint swelling and neck pain.       Shoulder/ back pain.  Hx of DDD  Skin: Negative for rash.  Allergic/Immunologic: Negative for environmental allergies.  Neurological: Positive for headaches. Negative for dizziness, tremors and numbness.  Hematological: Negative for adenopathy. Does not bruise/bleed easily.  Psychiatric/Behavioral: Positive for dysphoric mood. Negative for behavioral problems and sleep disturbance. The patient is nervous/anxious.     Today's Vitals   03/18/19 0838  BP: 110/80  Pulse: 80  Resp: 16  Temp: 97.6 F (36.4 C)  SpO2: 99%  Weight: 189 lb (85.7 kg)  Height: 5\' 3"  (1.6 m)   Body mass index is 33.48 kg/m.  Physical Exam Vitals signs and nursing note reviewed.  Constitutional:      General: She is not in acute distress.    Appearance: Normal appearance. She is well-developed. She is not diaphoretic.  HENT:     Head: Normocephalic and  atraumatic.     Nose: Nose normal.     Mouth/Throat:     Pharynx: No oropharyngeal exudate.  Eyes:     Pupils: Pupils are equal, round, and reactive to light.  Neck:     Musculoskeletal: Normal range of motion and neck supple.     Thyroid: No thyromegaly.     Vascular: No JVD.     Trachea: No tracheal deviation.  Cardiovascular:     Rate and Rhythm: Normal rate and regular rhythm.     Heart sounds: Normal heart sounds. No murmur. No friction rub. No gallop.   Pulmonary:     Effort: Pulmonary effort is normal. No respiratory distress.     Breath  sounds: Normal breath sounds. No wheezing or rales.  Chest:     Chest wall: No tenderness.  Abdominal:     General: Bowel sounds are normal.     Palpations: Abdomen is soft. There is no mass.     Tenderness: There is no abdominal tenderness. There is no guarding.     Hernia: No hernia is present.  Musculoskeletal: Normal range of motion.     Comments: There is moderate lower back pain which is more severe with bending and twisting at the waist. Pain is also more severe with pushing, pulling. And lifting with the arms. No visible abnormalities or deformities are present.   Lymphadenopathy:     Cervical: No cervical adenopathy.  Skin:    General: Skin is warm and dry.     Capillary Refill: Capillary refill takes less than 2 seconds.  Neurological:     Mental Status: She is alert and oriented to person, place, and time.     Cranial Nerves: No cranial nerve deficit.  Psychiatric:        Behavior: Behavior normal.        Thought Content: Thought content normal.        Judgment: Judgment normal.    Assessment/Plan: 1. Gastroesophageal reflux disease without esophagitis Doing better. Will continue to monitor.   2. Circadian rhythm sleep disorder, shift work type Much better with modafinil 100mg  daily. New prescription sent with 2 refills.  - modafinil (PROVIGIL) 100 MG tablet; Take 1 tablet (100 mg total) by mouth daily.  Dispense: 30  tablet; Refill: 3  3. Need for prophylactic vaccination with combined diphtheria-tetanus-pertussis (DTaP) vaccine T-dap vaccine order sent to pharmacy for administration.  - Tdap (BOOSTRIX) 5-2.5-18.5 LF-MCG/0.5 injection; Inject 0.5 mLs into the muscle once for 1 dose.  Dispense: 0.5 mL; Refill: 0  General Counseling: Morrie Sheldonshley verbalizes understanding of the findings of todays visit and agrees with plan of treatment. I have discussed any further diagnostic evaluation that may be needed or ordered today. We also reviewed her medications today. she has been encouraged to call the office with any questions or concerns that should arise related to todays visit.   This patient was seen by Vincent GrosHeather Conan Mcmanaway FNP Collaboration with Dr Lyndon CodeFozia M Khan as a part of collaborative care agreement  Meds ordered this encounter  Medications  . modafinil (PROVIGIL) 100 MG tablet    Sig: Take 1 tablet (100 mg total) by mouth daily.    Dispense:  30 tablet    Refill:  3    Order Specific Question:   Supervising Provider    Answer:   Lyndon CodeKHAN, FOZIA M [[id number]]  . Tdap (BOOSTRIX) 5-2.5-18.5 LF-MCG/0.5 injection    Sig: Inject 0.5 mLs into the muscle once for 1 dose.    Dispense:  0.5 mL    Refill:  0    Order Specific Question:   Supervising Provider    Answer:   Lyndon CodeKHAN, FOZIA M [[id number]]    Time spent: 3415 Minutes      Dr Lyndon CodeFozia M Khan Internal medicine

## 2019-03-31 ENCOUNTER — Telehealth: Payer: Self-pay

## 2019-03-31 NOTE — Telephone Encounter (Signed)
Confirmed and screened for 04-04-19 ov.

## 2019-04-04 ENCOUNTER — Encounter: Payer: Self-pay | Admitting: Internal Medicine

## 2019-04-04 ENCOUNTER — Ambulatory Visit: Payer: Commercial Managed Care - PPO | Admitting: Internal Medicine

## 2019-04-04 ENCOUNTER — Other Ambulatory Visit: Payer: Self-pay

## 2019-04-04 VITALS — BP 115/76 | HR 84 | Temp 97.4°F | Resp 16 | Ht 63.0 in | Wt 195.0 lb

## 2019-04-04 DIAGNOSIS — K219 Gastro-esophageal reflux disease without esophagitis: Secondary | ICD-10-CM | POA: Diagnosis not present

## 2019-04-04 DIAGNOSIS — G4726 Circadian rhythm sleep disorder, shift work type: Secondary | ICD-10-CM | POA: Diagnosis not present

## 2019-04-04 DIAGNOSIS — F419 Anxiety disorder, unspecified: Secondary | ICD-10-CM

## 2019-04-04 NOTE — Progress Notes (Signed)
[facility] [address] [place], [place] [id number]  Pulmonary Sleep Medicine   Office Visit Note  Patient Name: Savannah [name] DOB: February 01, 1986 MRN [id number]  Date of Service: 04/04/2019  Complaints/HPI: PT is here for follow up at this time.  She has been taking the modafinil with good results.  She still has some sleepiness while working.  However, she is working night shift and is attempting to deal with all the difficulties that come with sleeping during the day.    ROS  General: (-) fever, (-) chills, (-) night sweats, (-) weakness Skin: (-) rashes, (-) itching,. Eyes: (-) visual changes, (-) redness, (-) itching. Nose and Sinuses: (-) nasal stuffiness or itchiness, (-) postnasal drip, (-) nosebleeds, (-) sinus trouble. Mouth and Throat: (-) sore throat, (-) hoarseness. Neck: (-) swollen glands, (-) enlarged thyroid, (-) neck pain. Respiratory: - cough, (-) bloody sputum, - shortness of breath, - wheezing. Cardiovascular: - ankle swelling, (-) chest pain. Lymphatic: (-) lymph node enlargement. Neurologic: (-) numbness, (-) tingling. Psychiatric: (-) anxiety, (-) depression   Current Medication: Outpatient Encounter Medications as of 04/04/2019  Medication Sig  . Crisaborole (EUCRISA) 2 % OINT Apply 1 application topically 2 (two) times daily.  Marland Kitchen. levonorgestrel (MIRENA) 20 MCG/24HR IUD 1 each once by Intrauterine route.  . modafinil (PROVIGIL) 100 MG tablet Take 1 tablet (100 mg total) by mouth daily.  Marland Kitchen. omeprazole (PRILOSEC) 20 MG capsule TAKE 1 CAPSULE BY MOUTH EVERY DAY  . triamcinolone (KENALOG) 0.025 % cream Apply 1 application topically 2 (two) times daily.   No facility-administered encounter medications on file as of 04/04/2019.     Surgical History: Past Surgical History:  Procedure Laterality Date  . CARPAL TUNNEL RELEASE Right 09/29/2014   Procedure: CARPAL TUNNEL RELEASE;  Surgeon: Myra Rudehristopher Smith, MD;  Location: [facility] ORS;  Service:  Orthopedics;  Laterality: Right;  . CESAREAN SECTION    . IUD placement    . MYRINGOTOMY WITH TUBE PLACEMENT      Medical History: Past Medical History:  Diagnosis Date  . Anxiety   . Depression   . Eardrum rupture, left   . Headache     Family History: Family History  Problem Relation Age of Onset  . Bone cancer Maternal Grandfather   . Healthy Mother     Social History: Social History   Socioeconomic History  . Marital status: Married    Spouse name: Not on file  . Number of children: Not on file  . Years of education: Not on file  . Highest education level: Not on file  Occupational History  . Not on file  Social Needs  . Financial resource strain: Not on file  . Food insecurity    Worry: Not on file    Inability: Not on file  . Transportation needs    Medical: Not on file    Non-medical: Not on file  Tobacco Use  . Smoking status: Former Smoker    Packs/day: 0.25    Types: Cigarettes    Quit date: 06/14/2018    Years since quitting: 0.8  . Smokeless tobacco: Never Used  Substance and Sexual Activity  . Alcohol use: Not Currently  . Drug use: No  . Sexual activity: Yes    Birth control/protection: I.U.D.  Lifestyle  . Physical activity    Days per week: 0 days    Minutes per session: 0 min  . Stress: To some extent  Relationships  . Social connections    Talks  on phone: Three times a week    Gets together: Once a week    Attends religious service: Never    Active member of club or organization: No    Attends meetings of clubs or organizations: Never    Relationship status: Living with partner  . Intimate partner violence    Fear of current or ex partner: No    Emotionally abused: No    Physically abused: No    Forced sexual activity: No  Other Topics Concern  . Not on file  Social History Narrative  . Not on file    Vital Signs: Blood pressure 115/76, pulse 84, temperature (!) 97.4 F (36.3 C), resp. rate 16, height 5\' 3"  (1.6 m), weight  195 lb (88.5 kg), SpO2 97 %.  Examination: General Appearance: The patient is well-developed, well-nourished, and in no distress. Skin: Gross inspection of skin unremarkable. Head: normocephalic, no gross deformities. Eyes: no gross deformities noted. ENT: ears appear grossly normal no exudates. Neck: Supple. No thyromegaly. No LAD. Respiratory: clear bilaterally. Cardiovascular: Normal S1 and S2 without murmur or rub. Extremities: No cyanosis. pulses are equal. Neurologic: Alert and oriented. No involuntary movements.  LABS: Recent Results (from the past 2160 hour(s))  Rheumatoid factor     Status: None   Collection Time: 01/28/19 11:20 AM  Result Value Ref Range   Rhuematoid fact SerPl-aCnc <10.0 0.0 - 13.9 IU/mL  Uric acid     Status: None   Collection Time: 01/28/19 11:20 AM  Result Value Ref Range   Uric Acid 4.3 2.5 - 7.1 mg/dL    Comment:            Therapeutic target for gout patients: <6.0  ANA w/Reflex if Positive     Status: None   Collection Time: 01/28/19 11:20 AM  Result Value Ref Range   Anti Nuclear Antibody (ANA) Negative Negative  Sedimentation rate     Status: None   Collection Time: 01/28/19 11:20 AM  Result Value Ref Range   Sed Rate 2 0 - 32 mm/hr    Radiology: Dg Lumbar Spine Complete  Result Date: 01/26/2019 CLINICAL DATA:  Chronic back pain EXAM: LUMBAR SPINE - COMPLETE 4+ VIEW COMPARISON:  None. FINDINGS: There is no evidence of lumbar spine fracture. Alignment is normal. Intervertebral disc spaces are maintained. IUD in the pelvis IMPRESSION: Negative. Electronically Signed   By: 01/28/2019 M.D.   On: 01/26/2019 20:53    No results found.  No results found.    Assessment and Plan: Patient Active Problem List   Diagnosis Date Noted  . Need for prophylactic vaccination with combined diphtheria-tetanus-pertussis (DTaP) vaccine 03/18/2019  . Circadian rhythm sleep disorder, 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  . Generalized abdominal tenderness  without rebound tenderness 02/03/2019  . Chronic low back pain 02/03/2019  . Excessive daytime sleepiness 08/29/2018  . Fatigue 08/29/2018  . Atopic dermatitis 08/17/2018  . Gastroesophageal reflux disease without esophagitis 08/17/2018  . Vitamin D deficiency 08/17/2018  . Carpal tunnel syndrome 06/01/2018  . Bacterial folliculitis 06/01/2018  . Irritable bowel syndrome with both constipation and diarrhea 06/01/2018  . Genital warts 03/26/2017  . Depression 01/11/2015  . Psoriasis 01/11/2015  . Ankle pain 01/11/2015    1. Circadian rhythm sleep disorder, shift work type Continue to use modafinil as directed. Discontinue use and/or call clinic if you develop any chest pain, palpitations, headache or other new symptoms.  2. Gastroesophageal reflux disease without esophagitis Stable, continue current management.  3. Anxiety Pt  stopped lexapro.  Encouraged pt to discuss with pcp at follow up.   General Counseling: I have discussed the findings of the evaluation and examination with Savannah [name].  I have also discussed any further diagnostic evaluation thatmay be needed or ordered today. Savannah [name] verbalizes understanding of the findings of todays visit. We also reviewed her medications today and discussed drug interactions and side effects including but not limited excessive drowsiness and altered mental states. We also discussed that there is always a risk not just to her but also people around her. she has been encouraged to call the office with any questions or concerns that should arise related to todays visit.  No orders of the defined types were placed in this encounter.    Time spent: 25 This patient was seen by Orson Gear AGNP-C in Collaboration with Dr. Devona Konig as a part of collaborative care agreement.   I have personally obtained a history, examined the patient, evaluated laboratory and imaging results, formulated the assessment and plan and placed orders.    Allyne Gee, MD [facility] Pulmonary and Critical Care Sleep medicine

## 2019-04-28 ENCOUNTER — Ambulatory Visit: Payer: Commercial Managed Care - PPO | Admitting: Internal Medicine

## 2019-06-08 ENCOUNTER — Other Ambulatory Visit: Payer: Self-pay

## 2019-06-08 ENCOUNTER — Ambulatory Visit: Payer: Commercial Managed Care - PPO | Admitting: Adult Health

## 2019-06-08 ENCOUNTER — Encounter: Payer: Self-pay | Admitting: Adult Health

## 2019-06-08 VITALS — Ht 63.0 in

## 2019-06-08 DIAGNOSIS — M791 Myalgia, unspecified site: Secondary | ICD-10-CM | POA: Diagnosis not present

## 2019-06-08 DIAGNOSIS — M549 Dorsalgia, unspecified: Secondary | ICD-10-CM

## 2019-06-08 NOTE — Progress Notes (Signed)
[facility] [address] [place], [place] [address]  Internal MEDICINE  Telephone Visit  Patient Name: Savannah [name]  [id number]  [id number]  Date of Service: 06/08/2019  I connected with the patient at 1052 by telephone and verified the patients identity using two identifiers.   I discussed the limitations, risks, security and privacy concerns of performing an evaluation and management service by telephone and the availability of in person appointments. I also discussed with the patient that there may be a patient responsible charge related to the service.  The patient expressed understanding and agrees to proceed.    Chief Complaint  Patient presents with  . Telephone Assessment    PT JUST STARTED NEW JOB AND COWORKERS HAVE SAID WHEN THEY FIRST STARTED THEY WERE IN PAIN  . Telephone Screen  . Back Pain    UPPER PART OF BACK IS SWOLLEN   . Shoulder Pain    RIGHT SHOULDER PAIN     HPI  PT seen via video.  She reports she recently started a new position at work.  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 This was causing her some wrist/shoulder pain.  The pain has moved into her back and she was having difficulty moving.  She reports today it has improved.  She believes it is due to these new motions at work.     Current Medication: Outpatient Encounter Medications as of 06/08/2019  Medication Sig  . Crisaborole (EUCRISA) 2 % OINT Apply 1 application topically 2 (two) times daily.  Marland Kitchen levonorgestrel (MIRENA) 20 MCG/24HR IUD 1 each once by Intrauterine route.  . modafinil (PROVIGIL) 100 MG tablet Take 1 tablet (100 mg total) by mouth daily.  Marland Kitchen omeprazole (PRILOSEC) 20 MG capsule TAKE 1 CAPSULE BY MOUTH EVERY DAY  . triamcinolone (KENALOG) 0.025 % cream Apply 1 application topically 2 (two) times daily.   No facility-administered encounter medications on file as of 06/08/2019.    Surgical History: Past Surgical History:   Procedure Laterality Date  . CARPAL TUNNEL RELEASE Right 09/29/2014   Procedure: CARPAL TUNNEL RELEASE;  Surgeon: Myra Rude, MD;  Location: [facility] ORS;  Service: Orthopedics;  Laterality: Right;  . CESAREAN SECTION    . IUD placement    . MYRINGOTOMY WITH TUBE PLACEMENT      Medical History: Past Medical History:  Diagnosis Date  . Anxiety   . Depression   . Eardrum rupture, left   . Headache     Family History: Family History  Problem Relation Age of Onset  . Bone cancer Maternal Grandfather   . Healthy Mother     Social History   Socioeconomic History  . Marital status: Married    Spouse name: Not on file  . Number of children: Not on file  . Years of education: Not on file  . Highest education level: Not on file  Occupational History  . Not on file  Tobacco Use  . Smoking status: Former Smoker    Packs/day: 0.25    Types: Cigarettes    Quit date: 06/14/2018    Years since quitting: 0.9  . Smokeless tobacco: Never Used  Substance and Sexual Activity  . Alcohol use: Not Currently  . Drug use: No  . Sexual activity: Yes    Birth control/protection: I.U.D.  Other Topics Concern  . Not on file  Social History Narrative  . Not on file   Social Determinants of Health  Financial Resource Strain:   . Difficulty of Paying Living Expenses: Not on file  Food Insecurity:   . Worried About Charity fundraiser in the Last Year: Not on file  . Ran Out of Food in the Last Year: Not on file  Transportation Needs:   . Lack of Transportation (Medical): Not on file  . Lack of Transportation (Non-Medical): Not on file  Physical Activity:   . Days of Exercise per Week: Not on file  . Minutes of Exercise per Session: Not on file  Stress:   . Feeling of Stress : Not on file  Social Connections:   . Frequency of Communication with Friends and Family: Not on file  . Frequency of Social Gatherings with Friends and Family: Not on file  . Attends Religious Services:  Not on file  . Active Member of Clubs or Organizations: Not on file  . Attends Archivist Meetings: Not on file  . Marital Status: Not on file  Intimate Partner Violence:   . Fear of Current or Ex-Partner: Not on file  . Emotionally Abused: Not on file  . Physically Abused: Not on file  . Sexually Abused: Not on file      Review of Systems  Constitutional: Negative for chills, fatigue and unexpected weight change.  HENT: Negative for congestion, rhinorrhea, sneezing and sore throat.   Eyes: Negative for photophobia, pain and redness.  Respiratory: Negative for cough, chest tightness and shortness of breath.   Cardiovascular: Negative for chest pain and palpitations.  Gastrointestinal: Negative for abdominal pain, constipation, diarrhea, nausea and vomiting.  Endocrine: Negative.   Genitourinary: Negative for dysuria and frequency.  Musculoskeletal: Negative for arthralgias, back pain, joint swelling and neck pain.  Skin: Negative for rash.  Allergic/Immunologic: Negative.   Neurological: Negative for tremors and numbness.  Hematological: Negative for adenopathy. Does not bruise/bleed easily.  Psychiatric/Behavioral: Negative for behavioral problems and sleep disturbance. The patient is not nervous/anxious.     Vital Signs: Ht 5\' 3"  (1.6 m)   BMI 34.54 kg/m    Observation/Objective:  Well appearing, NAD noted.   Assessment/Plan: 1. Muscle soreness Use Ice or heat 20 minutes on, 2 hours off.  The soreness/discomfort is improving at this time.  Use NSAID as needed for inflammation/pain.   2. Upper back pain on right side See above recommendations., continue to monitor.   General Counseling: tywana robotham understanding of the findings of today's phone visit and agrees with plan of treatment. I have discussed any further diagnostic evaluation that may be needed or ordered today. We also reviewed her medications today. she has been encouraged to call the office  with any questions or concerns that should arise related to todays visit.    No orders of the defined types were placed in this encounter.   No orders of the defined types were placed in this encounter.   Time spent: [facility] Internal medicine

## 2019-06-21 ENCOUNTER — Other Ambulatory Visit: Payer: Self-pay

## 2019-06-21 ENCOUNTER — Ambulatory Visit: Payer: Commercial Managed Care - PPO | Admitting: Nurse Practitioner

## 2019-06-21 ENCOUNTER — Encounter: Payer: Self-pay | Admitting: Nurse Practitioner

## 2019-06-21 VITALS — BP 113/84 | HR 92 | Temp 97.6°F | Ht 63.0 in | Wt 192.0 lb

## 2019-06-21 DIAGNOSIS — M549 Dorsalgia, unspecified: Secondary | ICD-10-CM | POA: Diagnosis not present

## 2019-06-21 DIAGNOSIS — G4726 Circadian rhythm sleep disorder, shift work type: Secondary | ICD-10-CM | POA: Diagnosis not present

## 2019-06-21 MED ORDER — MODAFINIL 100 MG PO TABS: 100.0000 mg | ORAL_TABLET | Freq: Every day | ORAL | 3 refills | Status: DC

## 2019-06-21 MED ORDER — CYCLOBENZAPRINE HCL 10 MG PO TABS: 10.0000 mg | ORAL_TABLET | Freq: Every evening | ORAL | 3 refills | Status: DC | PRN

## 2019-06-21 NOTE — Progress Notes (Signed)
[facility] [place], [place] [address]  Internal MEDICINE  Office Visit Note  Patient Name: Savannah [name]  [id number]  [id number]  Date of Service: 06/21/2019  Chief Complaint  Patient presents with  . Follow-up  . Depression  . Back Pain    upper leftside of back hurts    The patient is here for follow up visit. She is having some pain in left upper back, adjacent to the spine and the left shoulder blade. She started a new job around [employer] time. Has to use her arms a great deal. She states that other muscles have calmed down, this particular area has just become worse. She has good ROM and and strength of both arms.  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 She takes modafanil daily which does help her to stay focused and on track while working. She does not have negative side effects.       Current Medication: Outpatient Encounter Medications as of 06/21/2019  Medication Sig  . Crisaborole (EUCRISA) 2 % OINT Apply 1 application topically 2 (two) times daily.  Marland Kitchen levonorgestrel (MIRENA) 20 MCG/24HR IUD 1 each once by Intrauterine route.  Marland Kitchen omeprazole (PRILOSEC) 20 MG capsule TAKE 1 CAPSULE BY MOUTH EVERY DAY  . triamcinolone (KENALOG) 0.025 % cream Apply 1 application topically 2 (two) times daily.  . [DISCONTINUED] modafinil (PROVIGIL) 100 MG tablet Take 1 tablet (100 mg total) by mouth daily.  . cyclobenzaprine (FLEXERIL) 10 MG tablet Take 1 tablet (10 mg total) by mouth at bedtime as needed for muscle spasms.  . modafinil (PROVIGIL) 100 MG tablet Take 1 tablet (100 mg total) by mouth daily.   No facility-administered encounter medications on file as of 06/21/2019.    Surgical History: Past Surgical History:  Procedure Laterality Date  . CARPAL TUNNEL RELEASE Right 09/29/2014   Procedure: CARPAL TUNNEL RELEASE;  Surgeon: Christophe Louis, MD;  Location: [facility] ORS;  Service: Orthopedics;  Laterality: Right;  . CESAREAN SECTION    .  IUD placement    . MYRINGOTOMY WITH TUBE PLACEMENT      Medical History: Past Medical History:  Diagnosis Date  . Anxiety   . Depression   . Eardrum rupture, left   . Headache     Family History: Family History  Problem Relation Age of Onset  . Bone cancer Maternal Grandfather   . Healthy Mother     Social History   Socioeconomic History  . Marital status: Married    Spouse name: Not on file  . Number of children: Not on file  . Years of education: Not on file  . Highest education level: Not on file  Occupational History  . Not on file  Tobacco Use  . Smoking status: Former Smoker    Packs/day: 0.25    Types: Cigarettes    Quit date: 06/14/2018    Years since quitting: 1.0  . Smokeless tobacco: Never Used  Substance and Sexual Activity  . Alcohol use: Not Currently  . Drug use: No  . Sexual activity: Yes    Birth control/protection: I.U.D.  Other Topics Concern  . Not on file  Social History Narrative  . Not on file   Social Determinants of Health   Financial Resource Strain:   . Difficulty of Paying Living Expenses: Not on file  Food Insecurity:   . Worried About Charity fundraiser in the Last Year: Not on file  . Ran Out of Food in  the Last Year: Not on file  Transportation Needs:   . Lack of Transportation (Medical): Not on file  . Lack of Transportation (Non-Medical): Not on file  Physical Activity:   . Days of Exercise per Week: Not on file  . Minutes of Exercise per Session: Not on file  Stress:   . Feeling of Stress : Not on file  Social Connections:   . Frequency of Communication with Friends and Family: Not on file  . Frequency of Social Gatherings with Friends and Family: Not on file  . Attends Religious Services: Not on file  . Active Member of Clubs or Organizations: Not on file  . Attends Banker Meetings: Not on file  . Marital Status: Not on file  Intimate Partner Violence:   . Fear of Current or Ex-Partner: Not on file   . Emotionally Abused: Not on file  . Physically Abused: Not on file  . Sexually Abused: Not on file      Review of Systems  Constitutional: Positive for fatigue. Negative for chills and unexpected weight change.  HENT: Negative for congestion, postnasal drip, rhinorrhea, sneezing and sore throat.   Respiratory: Negative for cough, chest tightness, shortness of breath and wheezing.   Cardiovascular: Negative for chest pain and palpitations.  Gastrointestinal: Negative for abdominal pain, constipation, diarrhea, nausea and vomiting.  Endocrine: Negative for cold intolerance, heat intolerance, polydipsia and polyuria.  Musculoskeletal: Positive for back pain and myalgias. Negative for arthralgias, joint swelling and neck pain.       Pain in left shoulder, adjacent to the left side of the spine. She does have full ROM and strength of the left arm.   Skin: Negative for rash.  Allergic/Immunologic: Negative for environmental allergies.  Neurological: Negative for dizziness, tremors, numbness and headaches.  Hematological: Negative for adenopathy. Does not bruise/bleed easily.  Psychiatric/Behavioral: Positive for decreased concentration. Negative for behavioral problems (Depression), sleep disturbance and suicidal ideas. The patient is not nervous/anxious.     Today's Vitals   06/21/19 0822  BP: 113/84  Pulse: 92  Temp: 97.6 F (36.4 C)  SpO2: 99%  Weight: 192 lb (87.1 kg)   Body mass index is 34.01 kg/m.  Physical Exam Vitals and nursing note reviewed.  Constitutional:      General: She is not in acute distress.    Appearance: Normal appearance. She is well-developed. She is not diaphoretic.  HENT:     Head: Normocephalic and atraumatic.     Nose: Nose normal.     Mouth/Throat:     Pharynx: No oropharyngeal exudate.  Eyes:     Pupils: Pupils are equal, round, and reactive to light.  Neck:     Thyroid: No thyromegaly.     Vascular: No JVD.     Trachea: No tracheal  deviation.  Cardiovascular:     Rate and Rhythm: Normal rate and regular rhythm.     Heart sounds: Normal heart sounds. No murmur. No friction rub. No gallop.   Pulmonary:     Effort: Pulmonary effort is normal. No respiratory distress.     Breath sounds: Normal breath sounds. No wheezing or rales.  Chest:     Chest wall: No tenderness.  Abdominal:     Palpations: Abdomen is soft.  Musculoskeletal:        General: Normal range of motion.     Cervical back: Normal range of motion and neck supple.     Comments: Tenderness along the left shoulder blade, adjacent to  the spine. There is full ROM and strength of the left arm.  No deformities or abnormalities are palpated today.   Lymphadenopathy:     Cervical: No cervical adenopathy.  Skin:    General: Skin is warm and dry.  Neurological:     Mental Status: She is alert and oriented to person, place, and time.     Cranial Nerves: No cranial nerve deficit.     Sensory: Sensory deficit present.  Psychiatric:        Behavior: Behavior normal.        Thought Content: Thought content normal.        Judgment: Judgment normal.     Assessment/Plan: 1. Upper back pain on left side Add cyclobenzaprine 10mg  at bedtime as needed. Recommended she apply heat to the affected area. Continue to take NSAIDs as needed and as indicated. Also recommended massage therapy.  - cyclobenzaprine (FLEXERIL) 10 MG tablet; Take 1 tablet (10 mg total) by mouth at bedtime as needed for muscle spasms.  Dispense: 30 tablet; Refill: 3  2. Circadian rhythm sleep disorder, shift work type Continue modafanil 100mg  daily as needed. New prescription sent to her pharmacy.  - modafinil (PROVIGIL) 100 MG tablet; Take 1 tablet (100 mg total) by mouth daily.  Dispense: 30 tablet; Refill: 3  General Counseling: Kennedi verbalizes understanding of the findings of todays visit and agrees with plan of treatment. I have discussed any further diagnostic evaluation that may be needed  or ordered today. We also reviewed her medications today. she has been encouraged to call the office with any questions or concerns that should arise related to todays visit.   This patient was seen by FNP Collaboration with Dr Morrie Sheldon as a part of collaborative care agreement  Meds ordered this encounter  Medications  . modafinil (PROVIGIL) 100 MG tablet    Sig: Take 1 tablet (100 mg total) by mouth daily.    Dispense:  30 tablet    Refill:  3    Order Specific Question:   Supervising Provider    Answer:   Vincent Gros [[id number]]  . cyclobenzaprine (FLEXERIL) 10 MG tablet    Sig: Take 1 tablet (10 mg total) by mouth at bedtime as needed for muscle spasms.    Dispense:  30 tablet    Refill:  3    Order Specific Question:   Supervising Provider    Answer:   Lyndon Code [[id number]]    Total time spent: 20 Minutes  Time spent includes review of chart, medications, test results, and follow up plan with the patient.      Dr Lyndon Code Internal medicine

## 2019-06-27 ENCOUNTER — Other Ambulatory Visit: Payer: Self-pay

## 2019-06-27 DIAGNOSIS — K219 Gastro-esophageal reflux disease without esophagitis: Secondary | ICD-10-CM

## 2019-06-27 MED ORDER — OMEPRAZOLE 20 MG PO CPDR: DELAYED_RELEASE_CAPSULE | ORAL | 1 refills | Status: DC

## 2019-08-04 ENCOUNTER — Telehealth: Payer: Self-pay

## 2019-08-04 NOTE — Telephone Encounter (Signed)
CONFIRMED AND SCREENED FOR 08-09-19 OV. 

## 2019-08-09 ENCOUNTER — Other Ambulatory Visit: Payer: Self-pay

## 2019-08-09 ENCOUNTER — Encounter: Payer: Self-pay | Admitting: Nurse Practitioner

## 2019-08-09 ENCOUNTER — Ambulatory Visit: Payer: Commercial Managed Care - PPO | Admitting: Nurse Practitioner

## 2019-08-09 VITALS — BP 110/78 | HR 91 | Temp 97.4°F | Resp 16 | Ht 63.0 in | Wt 191.4 lb

## 2019-08-09 DIAGNOSIS — R252 Cramp and spasm: Secondary | ICD-10-CM

## 2019-08-09 DIAGNOSIS — R5383 Other fatigue: Secondary | ICD-10-CM | POA: Diagnosis not present

## 2019-08-09 NOTE — Progress Notes (Signed)
[facility] [place], [place] [address]  Internal MEDICINE  Office Visit Note  Patient Name: Savannah [name]  [id number]  [id number]  Date of Service: 08/24/2019   Pt is here for a sick visit.   Chief Complaint  Patient presents with  . Leg Problem    going on for a month  . Wrist Pain    right hand     The patient presents for sick visit. She has had episode of right lower leg cramping. Happened during the night. Woke her up and pain was excruciating. Muscle stayed sore the entire day. This resolved, but then occurred again two weeks later. After the second time with right leg, the left leg was also effected. Cramping did resolve on it's own.  Since the second episode, she has increased her potassium and water intake. She has had a few "lighter" cramps in both legs and some in her upper arms. Cramping is not as severe as it initially started.       Current Medication:  Outpatient Encounter Medications as of 08/09/2019  Medication Sig  . Crisaborole (EUCRISA) 2 % OINT Apply 1 application topically 2 (two) times daily.  . cyclobenzaprine (FLEXERIL) 10 MG tablet Take 1 tablet (10 mg total) by mouth at bedtime as needed for muscle spasms.  Marland Kitchen levonorgestrel (MIRENA) 20 MCG/24HR IUD 1 each once by Intrauterine route.  . modafinil (PROVIGIL) 100 MG tablet Take 1 tablet (100 mg total) by mouth daily.  Marland Kitchen omeprazole (PRILOSEC) 20 MG capsule TAKE 1 CAPSULE BY MOUTH EVERY DAY  . triamcinolone (KENALOG) 0.025 % cream Apply 1 application topically 2 (two) times daily.   No facility-administered encounter medications on file as of 08/09/2019.      Medical History: Past Medical History:  Diagnosis Date  . Anxiety   . Depression   . Eardrum rupture, left   . Headache      Today's Vitals   08/09/19 1527  BP: 110/78  Pulse: 91  Resp: 16  Temp: (!) 97.4 F (36.3 C)  SpO2: 100%  Weight: 191 lb 6.4 oz (86.8 kg)  Height: 5\' 3"  (1.6 m)   Body mass  index is 33.9 kg/m.  Review of Systems  Constitutional: Negative for chills, fatigue and unexpected weight change.  HENT: Negative for congestion, postnasal drip, rhinorrhea, sneezing and sore throat.   Respiratory: Negative for cough, chest tightness and shortness of breath.   Cardiovascular: Negative for chest pain and palpitations.  Gastrointestinal: Negative for abdominal pain, constipation, diarrhea, nausea and vomiting.  Endocrine: Negative for cold intolerance, heat intolerance, polydipsia and polyuria.  Musculoskeletal: Positive for myalgias. Negative for arthralgias, back pain, joint swelling and neck pain.       Muscle cramping in both legs, off and on.   Skin: Negative for rash.  Allergic/Immunologic: Negative for environmental allergies.  Neurological: Negative for dizziness, tremors, numbness and headaches.  Hematological: Negative for adenopathy. Does not bruise/bleed easily.  Psychiatric/Behavioral: Positive for sleep disturbance. Negative for behavioral problems (Depression), decreased concentration and suicidal ideas. The patient is not nervous/anxious.     Physical Exam Vitals and nursing note reviewed.  Constitutional:      General: She is not in acute distress.    Appearance: Normal appearance. She is well-developed. She is not diaphoretic.  HENT:     Head: Normocephalic and atraumatic.     Mouth/Throat:     Pharynx: No oropharyngeal exudate.  Eyes:     Pupils: Pupils are equal, round, and  reactive to light.  Neck:     Thyroid: No thyromegaly.     Vascular: No JVD.     Trachea: No tracheal deviation.  Cardiovascular:     Rate and Rhythm: Normal rate and regular rhythm.     Heart sounds: Normal heart sounds. No murmur. No friction rub. No gallop.   Pulmonary:     Effort: Pulmonary effort is normal. No respiratory distress.     Breath sounds: Normal breath sounds. No wheezing or rales.  Chest:     Chest wall: No tenderness.  Abdominal:     General: Bowel  sounds are normal.     Palpations: Abdomen is soft.  Musculoskeletal:        General: Normal range of motion.     Cervical back: Normal range of motion and neck supple.  Lymphadenopathy:     Cervical: No cervical adenopathy.  Skin:    General: Skin is warm and dry.  Neurological:     Mental Status: She is alert and oriented to person, place, and time.     Cranial Nerves: No cranial nerve deficit.  Psychiatric:        Mood and Affect: Mood normal.        Behavior: Behavior normal.        Thought Content: Thought content normal.        Judgment: Judgment normal.    Assessment/Plan: 1. Leg cramps Encouraged her to continue with increased water intake. Recommended continuing potassium and adding magnesium. Will check labs for further evaluation.   2. Other fatigue Likely from leg cramps keeping her awake at night. Checking labs for further evaluation   General Counseling: malayja freund understanding of the findings of todays visit and agrees with plan of treatment. I have discussed any further diagnostic evaluation that may be needed or ordered today. We also reviewed her medications today. she has been encouraged to call the office with any questions or concerns that should arise related to todays visit.    Counseling:  This patient was seen by Vincent Gros FNP Collaboration with Dr Lyndon Code as a part of collaborative care agreement  Time spent: 25 Minutes

## 2019-08-15 ENCOUNTER — Other Ambulatory Visit: Payer: Self-pay | Admitting: Nurse Practitioner

## 2019-08-16 LAB — BASIC METABOLIC PANEL
BUN/Creatinine Ratio: 18 (ref 9–23)
BUN: 14 mg/dL (ref 6–20)
CO2: 22 mmol/L (ref 20–29)
Calcium: 9.4 mg/dL (ref 8.7–10.2)
Chloride: 102 mmol/L (ref 96–106)
Creatinine, Ser: 0.78 mg/dL (ref 0.57–1.00)
GFR calc Af Amer: 116 mL/min/{1.73_m2} (ref >59)
GFR calc non Af Amer: 101 mL/min/{1.73_m2} (ref >59)
Glucose: 92 mg/dL (ref 65–99)
Potassium: 4.1 mmol/L (ref 3.5–5.2)
Sodium: 139 mmol/L (ref 134–144)

## 2019-08-16 LAB — TSH: TSH: 2.43 u[IU]/mL (ref 0.450–4.500)

## 2019-08-16 LAB — VITAMIN D 25 HYDROXY (VIT D DEFICIENCY, FRACTURES): Vit D, 25-Hydroxy: 57.1 ng/mL (ref 30.0–100.0)

## 2019-08-16 LAB — MAGNESIUM: Magnesium: 1.9 mg/dL (ref 1.6–2.3)

## 2019-08-16 LAB — T4, FREE: Free T4: 0.79 ng/dL — ABNORMAL LOW (ref 0.82–1.77)

## 2019-08-16 LAB — PHOSPHORUS: Phosphorus: 3.6 mg/dL (ref 3.0–4.3)

## 2019-08-21 NOTE — Progress Notes (Signed)
Labs good. Discuss with patient at next visit .

## 2019-08-24 DIAGNOSIS — R252 Cramp and spasm: Secondary | ICD-10-CM | POA: Insufficient documentation

## 2019-09-29 ENCOUNTER — Telehealth: Payer: Self-pay

## 2019-09-29 NOTE — Telephone Encounter (Signed)
Confirmed appointment on 10/03/2019 and screened for covid. klh

## 2019-10-03 ENCOUNTER — Ambulatory Visit: Payer: Commercial Managed Care - PPO | Admitting: Internal Medicine

## 2019-10-03 ENCOUNTER — Encounter: Payer: Self-pay | Admitting: Internal Medicine

## 2019-10-03 VITALS — BP 145/70 | HR 84 | Temp 97.4°F | Resp 16 | Ht 63.0 in | Wt 190.4 lb

## 2019-10-03 DIAGNOSIS — K219 Gastro-esophageal reflux disease without esophagitis: Secondary | ICD-10-CM

## 2019-10-03 DIAGNOSIS — G4726 Circadian rhythm sleep disorder, shift work type: Secondary | ICD-10-CM

## 2019-10-03 NOTE — Progress Notes (Signed)
[facility] [address] [place], [place] [address]  Pulmonary Sleep Medicine   Office Visit Note  Patient Name: Savannah [name] DOB: 10-10-1985 MRN [id number]  Date of Service: 10/03/2019  Complaints/HPI: Pt is here for pulmonary.  She continues to use [employer] for night shift.  She is doing well.  She is averaging about 4 hours of sleep each night. We discussed good sleep hygiene.     ROS  General: (-) fever, (-) chills, (-) night sweats, (-) weakness Skin: (-) rashes, (-) itching,. Eyes: (-) visual changes, (-) redness, (-) itching. Nose and Sinuses: (-) nasal stuffiness or itchiness, (-) postnasal drip, (-) nosebleeds, (-) sinus trouble. Mouth and Throat: (-) sore throat, (-) hoarseness. Neck: (-) swollen glands, (-) enlarged thyroid, (-) neck pain. Respiratory: - cough, (-) bloody sputum, - shortness of breath, - wheezing. Cardiovascular: - ankle swelling, (-) chest pain. Lymphatic: (-) lymph node enlargement. Neurologic: (-) numbness, (-) tingling. Psychiatric: (-) anxiety, (-) depression   Current Medication: Outpatient Encounter Medications as of 10/03/2019  Medication Sig  . Crisaborole (EUCRISA) 2 % OINT Apply 1 application topically 2 (two) times daily.  Marland Kitchen levonorgestrel (MIRENA) 20 MCG/24HR IUD 1 each once by Intrauterine route.  . modafinil (PROVIGIL) 100 MG tablet Take 1 tablet (100 mg total) by mouth daily.  Marland Kitchen omeprazole (PRILOSEC) 20 MG capsule TAKE 1 CAPSULE BY MOUTH EVERY DAY  . triamcinolone (KENALOG) 0.025 % cream Apply 1 application topically 2 (two) times daily.  . [DISCONTINUED] cyclobenzaprine (FLEXERIL) 10 MG tablet Take 1 tablet (10 mg total) by mouth at bedtime as needed for muscle spasms.   No facility-administered encounter medications on file as of 10/03/2019.    Surgical History: Past Surgical History:  Procedure Laterality Date  . CARPAL TUNNEL RELEASE Right 09/29/2014   Procedure: CARPAL TUNNEL RELEASE;  Surgeon: Myra Rude, MD;  Location: [facility] ORS;  Service: Orthopedics;  Laterality: Right;  . CESAREAN SECTION    . IUD placement    . MYRINGOTOMY WITH TUBE PLACEMENT      Medical History: Past Medical History:  Diagnosis Date  . Anxiety   . Depression   . Eardrum rupture, left   . Headache     Family History: Family History  Problem Relation Age of Onset  . Bone cancer Maternal Grandfather   . Healthy Mother     Social History: Social History   Socioeconomic History  . Marital status: Married    Spouse name: Not on file  . Number of children: Not on file  . Years of education: Not on file  . Highest education level: Not on file  Occupational History  . Not on file  Tobacco Use  . Smoking status: Former Smoker    Packs/day: 0.25    Types: Cigarettes    Quit date: 06/14/2018    Years since quitting: 1.3  . Smokeless tobacco: Never Used  Substance and Sexual Activity  . Alcohol use: Not Currently  . Drug use: No  . Sexual activity: Yes    Birth control/protection: I.U.D.  Other Topics Concern  . Not on file  Social History Narrative  . Not on file   Social Determinants of Health   Financial Resource Strain:   . Difficulty of Paying Living Expenses:   Food Insecurity:   . Worried About Programme researcher, broadcasting/film/video in the Last Year:   . Barista in the Last Year:   Transportation Needs:   . Freight forwarder (Medical):   Marland Kitchen  Lack of Transportation (Non-Medical):   Physical Activity:   . Days of Exercise per Week:   . Minutes of Exercise per Session:   Stress:   . Feeling of Stress :   Social Connections:   . Frequency of Communication with Friends and Family:   . Frequency of Social Gatherings with Friends and Family:   . Attends Religious Services:   . Active Member of Clubs or Organizations:   . Attends Archivist Meetings:   Marland Kitchen Marital Status:   Intimate Partner Violence:   . Fear of Current or Ex-Partner:   . Emotionally Abused:   Marland Kitchen Physically  Abused:   . Sexually Abused:     Vital Signs: Blood pressure (!) 145/70, pulse 84, temperature (!) 97.4 F (36.3 C), resp. rate 16, height 5\' 3"  (1.6 m), weight 190 lb 6.4 oz (86.4 kg), SpO2 98 %.  Examination: General Appearance: The patient is well-developed, well-nourished, and in no distress. Skin: Gross inspection of skin unremarkable. Head: normocephalic, no gross deformities. Eyes: no gross deformities noted. ENT: ears appear grossly normal no exudates. Neck: Supple. No thyromegaly. No LAD. Respiratory: clear bilaterally. Cardiovascular: Normal S1 and S2 without murmur or rub. Extremities: No cyanosis. pulses are equal. Neurologic: Alert and oriented. No involuntary movements.  LABS: Recent Results (from the past 2160 hour(s))  Basic metabolic panel     Status: None   Collection Time: 08/15/19 11:25 AM  Result Value Ref Range   Glucose 92 65 - 99 mg/dL   BUN 14 6 - 20 mg/dL   Creatinine, Ser 0.78 0.57 - 1.00 mg/dL   GFR calc non Af Amer 101 >59 mL/min/1.73   GFR calc Af Amer 116 >59 mL/min/1.73   BUN/Creatinine Ratio 18 9 - 23   Sodium 139 134 - 144 mmol/L   Potassium 4.1 3.5 - 5.2 mmol/L   Chloride 102 96 - 106 mmol/L   CO2 22 20 - 29 mmol/L   Calcium 9.4 8.7 - 10.2 mg/dL  T4, free     Status: Abnormal   Collection Time: 08/15/19 11:25 AM  Result Value Ref Range   Free T4 0.79 (L) 0.82 - 1.77 ng/dL  TSH     Status: None   Collection Time: 08/15/19 11:25 AM  Result Value Ref Range   TSH 2.430 0.450 - 4.500 uIU/mL  VITAMIN D 25 Hydroxy (Vit-D Deficiency, Fractures)     Status: None   Collection Time: 08/15/19 11:25 AM  Result Value Ref Range   Vit D, 25-Hydroxy 57.1 30.0 - 100.0 ng/mL    Comment: Vitamin D deficiency has been defined by the Institute of Medicine and an Endocrine Society practice guideline as a level of serum 25-OH vitamin D less than 20 ng/mL (1,2). The Endocrine Society went on to further define vitamin D insufficiency as a level between 21  and 29 ng/mL (2). 1. IOM (Institute of Medicine). 2010. Dietary reference    intakes for calcium and D. [place]: The    [employer]. 2. Holick MF, Binkley [place], Bischoff-Ferrari HA, et al.    Evaluation, treatment, and prevention of vitamin D    deficiency: an Endocrine Society clinical practice    guideline. JCEM. 2011 Jul; 96(7):1911-30.   Phosphorus     Status: None   Collection Time: 08/15/19 11:25 AM  Result Value Ref Range   Phosphorus 3.6 3.0 - 4.3 mg/dL  Magnesium     Status: None   Collection Time: 08/15/19 11:25 AM  Result Value  Ref Range   Magnesium 1.9 1.6 - 2.3 mg/dL    Radiology: DG Lumbar Spine Complete  Result Date: 01/26/2019 CLINICAL DATA:  Chronic back pain EXAM: LUMBAR SPINE - COMPLETE 4+ VIEW COMPARISON:  None. FINDINGS: There is no evidence of lumbar spine fracture. Alignment is normal. Intervertebral disc spaces are maintained. IUD in the pelvis IMPRESSION: Negative. Electronically Signed   By: Jasmine Pang M.D.   On: 01/26/2019 20:53    No results found.  No results found.    Assessment and Plan: Patient Active Problem List   Diagnosis Date Noted  . Leg cramps 08/24/2019  . Upper back pain on left side 06/21/2019  . Need for prophylactic vaccination with combined diphtheria-tetanus-pertussis (DTaP) vaccine 03/18/2019  . Circadian rhythm sleep disorder, 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  . Generalized abdominal tenderness without rebound tenderness 02/03/2019  . Chronic low back pain 02/03/2019  . Excessive daytime sleepiness 08/29/2018  . Fatigue 08/29/2018  . Atopic dermatitis 08/17/2018  . Gastroesophageal reflux disease without esophagitis 08/17/2018  . Vitamin D deficiency 08/17/2018  . Carpal tunnel syndrome 06/01/2018  . Bacterial folliculitis 06/01/2018  . Irritable bowel syndrome with both constipation and diarrhea 06/01/2018  . Genital warts 03/26/2017  . Depression 01/11/2015  . Psoriasis 01/11/2015  . Ankle pain  01/11/2015   1. Circadian rhythm sleep disorder, shift work type Continue to use modafinil as needed.  Good results at this time.    2. Gastroesophageal reflux disease without esophagitis No recent issues.  Continue Prilosec.    General Counseling: I have discussed the findings of the evaluation and examination with Savannah [name].  I have also discussed any further diagnostic evaluation thatmay be needed or ordered today. Savannah [name] verbalizes understanding of the findings of todays visit. We also reviewed her medications today and discussed drug interactions and side effects including but not limited excessive drowsiness and altered mental states. We also discussed that there is always a risk not just to her but also people around her. she has been encouraged to call the office with any questions or concerns that should arise related to todays visit.  No orders of the defined types were placed in this encounter.    Time spent: 25 This patient was seen by Blima Ledger AGNP-C in Collaboration with Dr. Freda Munro as a part of collaborative care agreement.   I have personally obtained a history, examined the patient, evaluated laboratory and imaging results, formulated the assessment and plan and placed orders.    Yevonne Pax, MD [facility] Pulmonary and Critical Care Sleep medicine

## 2019-10-14 ENCOUNTER — Telehealth: Payer: Self-pay

## 2019-10-14 NOTE — Telephone Encounter (Signed)
Confirmed and screened for 10-18-19 ov. 

## 2019-10-18 ENCOUNTER — Other Ambulatory Visit: Payer: Self-pay

## 2019-10-18 ENCOUNTER — Telehealth: Payer: Self-pay

## 2019-10-18 ENCOUNTER — Encounter: Payer: Self-pay | Admitting: Nurse Practitioner

## 2019-10-18 ENCOUNTER — Ambulatory Visit: Payer: Commercial Managed Care - PPO | Admitting: Nurse Practitioner

## 2019-10-18 VITALS — BP 115/70 | HR 92 | Temp 97.5°F | Resp 16 | Ht 63.0 in | Wt 190.6 lb

## 2019-10-18 DIAGNOSIS — L209 Atopic dermatitis, unspecified: Secondary | ICD-10-CM

## 2019-10-18 DIAGNOSIS — M25511 Pain in right shoulder: Secondary | ICD-10-CM

## 2019-10-18 DIAGNOSIS — K5900 Constipation, unspecified: Secondary | ICD-10-CM

## 2019-10-18 DIAGNOSIS — G8929 Other chronic pain: Secondary | ICD-10-CM

## 2019-10-18 DIAGNOSIS — K219 Gastro-esophageal reflux disease without esophagitis: Secondary | ICD-10-CM | POA: Diagnosis not present

## 2019-10-18 DIAGNOSIS — R10817 Generalized abdominal tenderness: Secondary | ICD-10-CM

## 2019-10-18 DIAGNOSIS — G4726 Circadian rhythm sleep disorder, shift work type: Secondary | ICD-10-CM

## 2019-10-18 MED ORDER — MODAFINIL 200 MG PO TABS: 100.0000 mg | ORAL_TABLET | Freq: Every day | ORAL | 2 refills | Status: DC

## 2019-10-18 MED ORDER — EUCRISA 2 % EX OINT: 1.0000 "application " | TOPICAL_OINTMENT | Freq: Two times a day (BID) | CUTANEOUS | 2 refills | Status: DC

## 2019-10-18 MED ORDER — OMEPRAZOLE 40 MG PO CPDR: DELAYED_RELEASE_CAPSULE | ORAL | 2 refills | Status: DC

## 2019-10-18 MED ORDER — TRIAMCINOLONE ACETONIDE 0.025 % EX CREA: 1.0000 "application " | TOPICAL_CREAM | Freq: Two times a day (BID) | CUTANEOUS | 2 refills | Status: DC

## 2019-10-18 NOTE — Telephone Encounter (Signed)
Authorization was approved for [employer] from 10/18/2019 through 01/18/2020 SL

## 2019-10-18 NOTE — Progress Notes (Signed)
[facility] [address] [place], [place] [address]  Internal MEDICINE  Office Visit Note  Patient Name: Savannah [name]  [id number]  [id number]  Date of Service: 10/18/2019  Chief Complaint  Patient presents with  . Follow-up  . Depression  . Anxiety  . Abdominal Pain    experiences bloating everytime she eats    The patient is here for routine visit. Today, she states that she his having generalized abdominal tenderness especially after eating. Feels bloated and has excess gas. Cannot think of any foods in particular which makes this worse or better.  This has been bothering her for several months. This has gradually become worse. Now, happening every time she eats. She did have ultrasound of the abdomen in October which was normal. Symptoms have continued to get worse. She is also feeling increased amount of stomach acid production. This is worse when she leans or bends over.  Right shoulder is also tender. Sometimes doesn't bother her, but other times, will bother her while she is doing very little activity.  She has left foot pain. Hurts at the top of the left foot, all the way up the foot and around to the bottom of the foot. Bothered her for about two weeks, then past two days has been ok.  Having more problems with feeling fatigued while at work. Continues to work night shift. Is currently on modafanil 100mg . Was working very well, however, now, does not seem to be helping as much as it used to.  Increased right shoulder pain. Hurting when lifting anything with weight. Also hurts to lift her right arm vertically or horizontally. Reaching behind her also increases pain.       Current Medication: Outpatient Encounter Medications as of 10/18/2019  Medication Sig  . Crisaborole (EUCRISA) 2 % OINT Apply 1 application topically 2 (two) times daily.  12/18/2019 levonorgestrel (MIRENA) 20 MCG/24HR IUD 1 each once by Intrauterine route.  . modafinil (PROVIGIL) 200 MG tablet Take 0.5  tablets (100 mg total) by mouth daily.  Marland Kitchen omeprazole (PRILOSEC) 40 MG capsule TAKE 1 CAPSULE BY MOUTH EVERY DAY  . triamcinolone (KENALOG) 0.025 % cream Apply 1 application topically 2 (two) times daily.  . [DISCONTINUED] Crisaborole (EUCRISA) 2 % OINT Apply 1 application topically 2 (two) times daily.  . [DISCONTINUED] modafinil (PROVIGIL) 100 MG tablet Take 1 tablet (100 mg total) by mouth daily.  . [DISCONTINUED] omeprazole (PRILOSEC) 20 MG capsule TAKE 1 CAPSULE BY MOUTH EVERY DAY  . [DISCONTINUED] triamcinolone (KENALOG) 0.025 % cream Apply 1 application topically 2 (two) times daily.   No facility-administered encounter medications on file as of 10/18/2019.    Surgical History: Past Surgical History:  Procedure Laterality Date  . CARPAL TUNNEL RELEASE Right 09/29/2014   Procedure: CARPAL TUNNEL RELEASE;  Surgeon: 10/01/2014, MD;  Location: [facility] ORS;  Service: Orthopedics;  Laterality: Right;  . CESAREAN SECTION    . IUD placement    . MYRINGOTOMY WITH TUBE PLACEMENT      Medical History: Past Medical History:  Diagnosis Date  . Anxiety   . Depression   . Eardrum rupture, left   . Headache     Family History: Family History  Problem Relation Age of Onset  . Bone cancer Maternal Grandfather   . Healthy Mother     Social History   Socioeconomic History  . Marital status: Married    Spouse name: Not on file  . Number of children: Not on file  . Years  of education: Not on file  . Highest education level: Not on file  Occupational History  . Not on file  Tobacco Use  . Smoking status: Former Smoker    Packs/day: 0.25    Types: Cigarettes    Quit date: 06/14/2018    Years since quitting: 1.3  . Smokeless tobacco: Never Used  Substance and Sexual Activity  . Alcohol use: Not Currently  . Drug use: No  . Sexual activity: Yes    Birth control/protection: I.U.D.  Other Topics Concern  . Not on file  Social History Narrative  . Not on file   Social  Determinants of Health   Financial Resource Strain:   . Difficulty of Paying Living Expenses:   Food Insecurity:   . Worried About Programme researcher, broadcasting/film/video in the Last Year:   . Barista in the Last Year:   Transportation Needs:   . Freight forwarder (Medical):   Marland Kitchen Lack of Transportation (Non-Medical):   Physical Activity:   . Days of Exercise per Week:   . Minutes of Exercise per Session:   Stress:   . Feeling of Stress :   Social Connections:   . Frequency of Communication with Friends and Family:   . Frequency of Social Gatherings with Friends and Family:   . Attends Religious Services:   . Active Member of Clubs or Organizations:   . Attends Banker Meetings:   Marland Kitchen Marital Status:   Intimate Partner Violence:   . Fear of Current or Ex-Partner:   . Emotionally Abused:   Marland Kitchen Physically Abused:   . Sexually Abused:       Review of Systems  Constitutional: Positive for fatigue. Negative for chills and unexpected weight change.       Fatigue is getting worse. She works night shift.  HENT: Negative for congestion, rhinorrhea, sneezing and sore throat.   Respiratory: Negative for cough, chest tightness and shortness of breath.   Cardiovascular: Negative for chest pain and palpitations.  Gastrointestinal: Positive for constipation. Negative for abdominal pain, diarrhea, nausea and vomiting.       Generalized abdominal tenderness especially after eating. Feels bloated and has excess gas. Cannot think of any foods in particular which makes this worse or better. This is happening every time she eats.   Endocrine: Negative for cold intolerance, heat intolerance, polydipsia and polyuria.  Musculoskeletal: Positive for arthralgias and myalgias. Negative for back pain, joint swelling and neck pain.       Right shoulder pain, worse with exertion or lifting anything with weight.   Skin: Negative for rash.  Allergic/Immunologic: Negative for environmental allergies.    Neurological: Positive for headaches. Negative for dizziness, tremors and numbness.  Hematological: Negative for adenopathy. Does not bruise/bleed easily.  Psychiatric/Behavioral: Positive for dysphoric mood and sleep disturbance. Negative for behavioral problems. The patient is nervous/anxious.     Today's Vitals   10/18/19 0843  BP: 115/70  Pulse: 92  Resp: 16  Temp: (!) 97.5 F (36.4 C)  SpO2: 99%  Weight: 190 lb 9.6 oz (86.5 kg)  Height: 5\' 3"  (1.6 m)   Body mass index is 33.76 kg/m.  Physical Exam Vitals and nursing note reviewed.  Constitutional:      General: She is not in acute distress.    Appearance: Normal appearance. She is well-developed. She is not diaphoretic.  HENT:     Head: Normocephalic and atraumatic.     Mouth/Throat:     Pharynx:  No oropharyngeal exudate.  Eyes:     Pupils: Pupils are equal, round, and reactive to light.  Neck:     Thyroid: No thyromegaly.     Vascular: No JVD.     Trachea: No tracheal deviation.  Cardiovascular:     Rate and Rhythm: Normal rate and regular rhythm.     Heart sounds: Normal heart sounds. No murmur. No friction rub. No gallop.   Pulmonary:     Effort: Pulmonary effort is normal. No respiratory distress.     Breath sounds: Normal breath sounds. No wheezing or rales.  Chest:     Chest wall: No tenderness.  Abdominal:     General: Bowel sounds are normal.     Palpations: Abdomen is soft.     Tenderness: There is no abdominal tenderness.  Musculoskeletal:        General: Normal range of motion.     Right shoulder: Tenderness, bony tenderness and crepitus present. No deformity.       Arms:     Cervical back: Normal range of motion and neck supple.  Lymphadenopathy:     Cervical: No cervical adenopathy.  Skin:    General: Skin is warm and dry.  Neurological:     General: No focal deficit present.     Mental Status: She is alert and oriented to person, place, and time.     Cranial Nerves: No cranial nerve  deficit.  Psychiatric:        Mood and Affect: Mood normal.        Behavior: Behavior normal.        Thought Content: Thought content normal.        Judgment: Judgment normal.    Assessment/Plan: 1. Generalized abdominal tenderness without rebound tenderness Reviewed ultrasound, done 02/2019 with the patient. Normal results. Will get CT abdomen for further evaluation.   2. Constipation, unspecified constipation type Reviewed ultrasound, done 02/2019 with the patient. Normal results. Will get CT abdomen for further evaluation. Recommended she increase intake of water and fiber. Consider OC stool softener.  - CT ABDOMEN W CONTRAST; Future  3. Gastroesophageal reflux disease without esophagitis Increase omeprazole to 40mg  daily.  - omeprazole (PRILOSEC) 40 MG capsule; TAKE 1 CAPSULE BY MOUTH EVERY DAY  Dispense: 30 capsule; Refill: 2  4. Chronic right shoulder pain Will get x-ray of right shoulder for further evaluation.  - DG Shoulder Right; Future  5. Circadian rhythm sleep disorder, shift work type Increase modafinil to 200mg  daily. Sleep hygiene discussed.  - modafinil (PROVIGIL) 200 MG tablet; Take 0.5 tablets (100 mg total) by mouth daily.  Dispense: 30 tablet; Refill: 2  6. Atopic dermatitis, unspecified type Continue to use eucrisa and triamcinolone creams as needed and as prescribed  - Crisaborole (EUCRISA) 2 % OINT; Apply 1 application topically 2 (two) times daily.  Dispense: 100 g; Refill: 2 - triamcinolone (KENALOG) 0.025 % cream; Apply 1 application topically 2 (two) times daily.  Dispense: 80 g; Refill: 2  General Counseling: Khushbu verbalizes understanding of the findings of todays visit and agrees with plan of treatment. I have discussed any further diagnostic evaluation that may be needed or ordered today. We also reviewed her medications today. she has been encouraged to call the office with any questions or concerns that should arise related to todays visit.  This  patient was seen by Leretha Pol FNP Collaboration with Dr Lavera Guise as a part of collaborative care agreement  Orders Placed This Encounter  Procedures  .  CT ABDOMEN W CONTRAST  . DG Shoulder Right    Meds ordered this encounter  Medications  . modafinil (PROVIGIL) 200 MG tablet    Sig: Take 0.5 tablets (100 mg total) by mouth daily.    Dispense:  30 tablet    Refill:  2    Order Specific Question:   Supervising Provider    Answer:   Lyndon Code [[id number]]  . Crisaborole (EUCRISA) 2 % OINT    Sig: Apply 1 application topically 2 (two) times daily.    Dispense:  100 g    Refill:  2    Order Specific Question:   Supervising Provider    Answer:   Lyndon Code [[id number]]  . triamcinolone (KENALOG) 0.025 % cream    Sig: Apply 1 application topically 2 (two) times daily.    Dispense:  80 g    Refill:  2    Order Specific Question:   Supervising Provider    Answer:   Lyndon Code [[id number]]  . omeprazole (PRILOSEC) 40 MG capsule    Sig: TAKE 1 CAPSULE BY MOUTH EVERY DAY    Dispense:  30 capsule    Refill:  2    Please note increased dose    Order Specific Question:   Supervising Provider    Answer:   Lyndon Code [[id number]]    Total time spent: 40 Minutes Time spent includes review of chart, medications, test results, and follow up plan with the patient.      Dr Lyndon Code Internal medicine

## 2019-10-26 ENCOUNTER — Ambulatory Visit
Admission: RE | Admit: 2019-10-26 | Discharge: 2019-10-26 | Disposition: A | Discharge status: Home / Self Care | Payer: Commercial Managed Care - PPO | Source: Ambulatory Visit | Attending: Nurse Practitioner | Admitting: Nurse Practitioner

## 2019-10-26 ENCOUNTER — Ambulatory Visit
Admission: RE | Admit: 2019-10-26 | Discharge: 2019-10-26 | Disposition: A | Discharge status: Home / Self Care | Payer: Commercial Managed Care - PPO | Source: Home / Self Care | Attending: Nurse Practitioner | Admitting: Nurse Practitioner

## 2019-10-26 ENCOUNTER — Other Ambulatory Visit: Payer: Self-pay

## 2019-10-26 DIAGNOSIS — G8929 Other chronic pain: Secondary | ICD-10-CM | POA: Insufficient documentation

## 2019-10-26 DIAGNOSIS — M25511 Pain in right shoulder: Secondary | ICD-10-CM | POA: Diagnosis present

## 2019-10-26 DIAGNOSIS — K5900 Constipation, unspecified: Secondary | ICD-10-CM | POA: Insufficient documentation

## 2019-10-26 MED ORDER — IOHEXOL 300 MG/ML  SOLN
100.0000 mL | Freq: Once | INTRAMUSCULAR | Status: AC | PRN
  Administered 2019-10-26: 100 mL via INTRAVENOUS

## 2019-10-26 NOTE — Progress Notes (Signed)
Normal right shoulder xray

## 2019-10-26 NOTE — Progress Notes (Signed)
CT abdomen normal. Discuss with patient at visit 11/04/2019

## 2019-11-02 ENCOUNTER — Telehealth: Payer: Self-pay

## 2019-11-02 NOTE — Telephone Encounter (Signed)
Lmom to confirm and screen for 11-04-19 ov. 

## 2019-11-04 ENCOUNTER — Ambulatory Visit: Payer: Commercial Managed Care - PPO | Admitting: Nurse Practitioner

## 2019-11-04 ENCOUNTER — Other Ambulatory Visit: Payer: Self-pay

## 2019-11-04 ENCOUNTER — Encounter: Payer: Self-pay | Admitting: Nurse Practitioner

## 2019-11-04 VITALS — BP 107/77 | HR 86 | Temp 97.4°F | Resp 16 | Ht 63.0 in | Wt 186.4 lb

## 2019-11-04 DIAGNOSIS — R10817 Generalized abdominal tenderness: Secondary | ICD-10-CM | POA: Diagnosis not present

## 2019-11-04 DIAGNOSIS — K219 Gastro-esophageal reflux disease without esophagitis: Secondary | ICD-10-CM

## 2019-11-04 DIAGNOSIS — M25511 Pain in right shoulder: Secondary | ICD-10-CM

## 2019-11-04 DIAGNOSIS — G8929 Other chronic pain: Secondary | ICD-10-CM | POA: Diagnosis not present

## 2019-11-04 NOTE — Progress Notes (Signed)
[facility] [address] [place], [place] [address]  Internal MEDICINE  Office Visit Note  Patient Name: Savannah [name]  [id number]  [id number]  Date of Service: 11/13/2019  Chief Complaint  Patient presents with  . Follow-up    ct result   . Depression  . Anxiety    The patient is herr for follow up visit. She had been having generalized abdominal tenderness especially after eating. Feels bloated and has excess gas. Cannot think of any foods in particular which makes this worse or better.  This has been bothering her for several months. This has gradually become worse. was happening every time she eats. She did have ultrasound of the abdomen in October which was normal. When symptoms did not improve, she had CT scan of the abdomen which  was normal. Did increase dose of omeprazole to 40mg  daily. She states that this has helped a great deal. States that she still has some symptoms, they are just much better.  Right shoulder is also tender. Sometimes doesn't bother her, but other times, will bother her while she is doing very little activity. Had x-ray done after her last visit. Results were normal.        Current Medication: Outpatient Encounter Medications as of 11/04/2019  Medication Sig  . Crisaborole (EUCRISA) 2 % OINT Apply 1 application topically 2 (two) times daily.  11/06/2019 levonorgestrel (MIRENA) 20 MCG/24HR IUD 1 each once by Intrauterine route.  . modafinil (PROVIGIL) 200 MG tablet Take 0.5 tablets (100 mg total) by mouth daily.  Marland Kitchen omeprazole (PRILOSEC) 40 MG capsule TAKE 1 CAPSULE BY MOUTH EVERY DAY  . triamcinolone (KENALOG) 0.025 % cream Apply 1 application topically 2 (two) times daily.   No facility-administered encounter medications on file as of 11/04/2019.    Surgical History: Past Surgical History:  Procedure Laterality Date  . CARPAL TUNNEL RELEASE Right 09/29/2014   Procedure: CARPAL TUNNEL RELEASE;  Surgeon: 10/01/2014, MD;  Location: [facility]  ORS;  Service: Orthopedics;  Laterality: Right;  . CESAREAN SECTION    . IUD placement    . MYRINGOTOMY WITH TUBE PLACEMENT      Medical History: Past Medical History:  Diagnosis Date  . Anxiety   . Depression   . Eardrum rupture, left   . Headache     Family History: Family History  Problem Relation Age of Onset  . Bone cancer Maternal Grandfather   . Healthy Mother     Social History   Socioeconomic History  . Marital status: Married    Spouse name: Not on file  . Number of children: Not on file  . Years of education: Not on file  . Highest education level: Not on file  Occupational History  . Not on file  Tobacco Use  . Smoking status: Former Smoker    Packs/day: 0.25    Types: Cigarettes    Quit date: 06/14/2018    Years since quitting: 1.4  . Smokeless tobacco: Never Used  Vaping Use  . Vaping Use: Never used  Substance and Sexual Activity  . Alcohol use: Not Currently  . Drug use: No  . Sexual activity: Yes    Birth control/protection: I.U.D.  Other Topics Concern  . Not on file  Social History Narrative  . Not on file   Social Determinants of Health   Financial Resource Strain:   . Difficulty of Paying Living Expenses:   Food Insecurity:   . Worried About 08/13/2018 in the  Last Year:   . Ran Out of Food in the Last Year:   Transportation Needs:   . Freight forwarder (Medical):   Marland Kitchen Lack of Transportation (Non-Medical):   Physical Activity:   . Days of Exercise per Week:   . Minutes of Exercise per Session:   Stress:   . Feeling of Stress :   Social Connections:   . Frequency of Communication with Friends and Family:   . Frequency of Social Gatherings with Friends and Family:   . Attends Religious Services:   . Active Member of Clubs or Organizations:   . Attends Banker Meetings:   Marland Kitchen Marital Status:   Intimate Partner Violence:   . Fear of Current or Ex-Partner:   . Emotionally Abused:   Marland Kitchen Physically Abused:     . Sexually Abused:       Review of Systems  Constitutional: Positive for fatigue. Negative for chills and unexpected weight change.       Fatigue is getting worse. She works night shift.  HENT: Negative for congestion, rhinorrhea, sneezing and sore throat.   Respiratory: Negative for cough, chest tightness and shortness of breath.   Cardiovascular: Negative for chest pain and palpitations.  Gastrointestinal: Negative for abdominal pain, constipation, diarrhea, nausea and vomiting.       Generalized abdominal tenderness especially after eating. Feels bloated and has excess gas. Improved since increasing dose of omeprazole.   Endocrine: Negative for cold intolerance, heat intolerance, polydipsia and polyuria.  Musculoskeletal: Positive for arthralgias and myalgias. Negative for back pain, joint swelling and neck pain.       Right shoulder pain, worse with exertion or lifting anything with weight.   Skin: Negative for rash.  Allergic/Immunologic: Negative for environmental allergies.  Neurological: Positive for headaches. Negative for dizziness, tremors and numbness.  Hematological: Negative for adenopathy. Does not bruise/bleed easily.  Psychiatric/Behavioral: Positive for dysphoric mood and sleep disturbance. Negative for behavioral problems. The patient is nervous/anxious.     Today's Vitals   11/04/19 1023  BP: 107/77  Pulse: 86  Resp: 16  Temp: (!) 97.4 F (36.3 C)  SpO2: 97%  Weight: 186 lb 6.4 oz (84.6 kg)  Height: 5\' 3"  (1.6 m)   Body mass index is 33.02 kg/m.  Physical Exam Vitals and nursing note reviewed.  Constitutional:      General: She is not in acute distress.    Appearance: Normal appearance. She is well-developed. She is not diaphoretic.  HENT:     Head: Normocephalic and atraumatic.     Mouth/Throat:     Pharynx: No oropharyngeal exudate.  Eyes:     Pupils: Pupils are equal, round, and reactive to light.  Neck:     Thyroid: No thyromegaly.      Vascular: No JVD.     Trachea: No tracheal deviation.  Cardiovascular:     Rate and Rhythm: Normal rate and regular rhythm.     Heart sounds: Normal heart sounds. No murmur heard.  No friction rub. No gallop.   Pulmonary:     Effort: Pulmonary effort is normal. No respiratory distress.     Breath sounds: Normal breath sounds. No wheezing or rales.  Chest:     Chest wall: No tenderness.  Abdominal:     General: Bowel sounds are normal.     Palpations: Abdomen is soft.     Tenderness: There is abdominal tenderness.  Musculoskeletal:        General: Normal range of motion.  Right shoulder: Tenderness, bony tenderness and crepitus present. No deformity.       Arms:     Cervical back: Normal range of motion and neck supple.  Lymphadenopathy:     Cervical: No cervical adenopathy.  Skin:    General: Skin is warm and dry.  Neurological:     General: No focal deficit present.     Mental Status: She is alert and oriented to person, place, and time.     Cranial Nerves: No cranial nerve deficit.  Psychiatric:        Mood and Affect: Mood normal.        Behavior: Behavior normal.        Thought Content: Thought content normal.        Judgment: Judgment normal.   Assessment/Plan: 1. Generalized abdominal tenderness without rebound tenderness Reviewed results of CT abdomen with the patient which were normal. Improved abdominal pain since last visit. Will continue to monitor.   2. Gastroesophageal reflux disease without esophagitis Improved. Continue with increased dose omeprazole.   3. Chronic right shoulder pain Reviewed x-ray results of right shoulder x-ray which were normal. Continue to rest and ice the shoulder when tender. She should take tylenol or NSAIDs as needed for pain/inflammation. Will refer to orthopedics as indicated.   General Counseling: lovelee forner understanding of the findings of todays visit and agrees with plan of treatment. I have discussed any further  diagnostic evaluation that may be needed or ordered today. We also reviewed her medications today. she has been encouraged to call the office with any questions or concerns that should arise related to todays visit.   This patient was seen by Leretha Pol FNP Collaboration with Dr Lavera Guise as a part of collaborative care agreement  Total time spent: 30 Minutes   Time spent includes review of chart, medications, test results, and follow up plan with the patient.      Dr Lavera Guise Internal medicine

## 2019-11-24 ENCOUNTER — Other Ambulatory Visit: Payer: Self-pay

## 2019-11-29 ENCOUNTER — Other Ambulatory Visit: Payer: Self-pay

## 2019-12-06 ENCOUNTER — Encounter: Payer: Self-pay | Admitting: Nurse Practitioner

## 2019-12-20 ENCOUNTER — Ambulatory Visit (INDEPENDENT_AMBULATORY_CARE_PROVIDER_SITE_OTHER): Payer: Commercial Managed Care - PPO | Admitting: Hospice and Palliative Medicine

## 2019-12-20 ENCOUNTER — Other Ambulatory Visit: Payer: Self-pay

## 2019-12-20 ENCOUNTER — Encounter: Payer: Self-pay | Admitting: Hospice and Palliative Medicine

## 2019-12-20 DIAGNOSIS — L409 Psoriasis, unspecified: Secondary | ICD-10-CM

## 2019-12-20 DIAGNOSIS — R7989 Other specified abnormal findings of blood chemistry: Secondary | ICD-10-CM

## 2019-12-20 MED ORDER — METHYLPREDNISOLONE ACETATE 80 MG/ML IJ SUSP
80.0000 mg | Freq: Once | INTRAMUSCULAR | Status: AC
  Administered 2019-12-20: 80 mg via INTRAMUSCULAR

## 2019-12-20 MED ORDER — PREDNISONE 10 MG (21) PO TBPK: ORAL_TABLET | ORAL | 0 refills | Status: DC

## 2019-12-20 MED ORDER — HYDROXYZINE HCL 25 MG PO TABS: 25.0000 mg | ORAL_TABLET | Freq: Three times a day (TID) | ORAL | 0 refills | Status: DC | PRN

## 2019-12-20 MED ORDER — EMOLLIENT BASE EX CREA: TOPICAL_CREAM | CUTANEOUS | 0 refills | Status: DC | PRN

## 2019-12-20 NOTE — Progress Notes (Signed)
[facility] [address] [place], [place] [address]  Internal MEDICINE  Office Visit Note  Patient Name: Savannah [name]  [id number]  [id number]  Date of Service: 12/21/2019  Chief Complaint  Patient presents with  . Psoriasis    Feels out of control; has felt this way for about a month, keeps spreading    HPI Pt is here for a sick visit. Is here for a flare up of her psoriasis. This recent flare started about a month ago. She has plaques on multiple areas of her body, with some areas open and weeping. She has been seen recently for this flare and has been started on and tried numerous lotions OTC as well as prescription. She has also tried numerous OTC baths and soaks. At this time she is scheduled to see a dermatologist 8/17. She is frustrated and has not found any lotions/creams/baths/soaks that have helped relieve this flare. Many of the lotions/creams cause severe burning. She complains of severe itching at times. Was first diagnosed with psoriasis about 10 years ago. Has not had a flare this severe since. She does not report any changes in her daily routine that she feels may have caused her flare. Will prescribe medications to help alleviate itching to assist with management until she can be seen by dermatologist.  Current Medication: Outpatient Encounter Medications as of 12/20/2019  Medication Sig  . Crisaborole (EUCRISA) 2 % OINT Apply 1 application topically 2 (two) times daily.  Marland Kitchen levonorgestrel (MIRENA) 20 MCG/24HR IUD 1 each once by Intrauterine route.  . modafinil (PROVIGIL) 200 MG tablet Take 0.5 tablets (100 mg total) by mouth daily.  Marland Kitchen omeprazole (PRILOSEC) 40 MG capsule TAKE 1 CAPSULE BY MOUTH EVERY DAY  . triamcinolone (KENALOG) 0.025 % cream Apply 1 application topically 2 (two) times daily.  Marland Kitchen emollient (BIAFINE) cream Apply topically as needed.  . hydrOXYzine (ATARAX/VISTARIL) 25 MG tablet Take 1 tablet (25 mg total) by mouth 3 (three) times daily as  needed.  . predniSONE (STERAPRED UNI-PAK 21 TAB) 10 MG (21) TBPK tablet Use as directed  . [EXPIRED] methylPREDNISolone acetate (DEPO-MEDROL) injection 80 mg    No facility-administered encounter medications on file as of 12/20/2019.      Medical History: Past Medical History:  Diagnosis Date  . Anxiety   . Depression   . Eardrum rupture, left   . Headache    Vital Signs: BP 118/77   Pulse 74   Temp 97.6 F (36.4 C)   Resp 16   Ht 5\' 3"  (1.6 m)   Wt 186 lb 3.2 oz (84.5 kg)   SpO2 99%   BMI 32.98 kg/m    Review of Systems  Constitutional: Negative for fatigue and fever.  HENT: Negative for sinus pressure and sore throat.   Eyes: Negative for photophobia, itching and visual disturbance.  Respiratory: Negative for cough, chest tightness, shortness of breath and wheezing.   Cardiovascular: Negative for chest pain, palpitations and leg swelling.  Gastrointestinal: Negative for abdominal pain, constipation, diarrhea, nausea and vomiting.  Genitourinary: Negative for dysuria and flank pain.  Musculoskeletal: Negative for arthralgias, back pain, gait problem, myalgias and neck pain.  Skin: Positive for rash. Negative for color change.       Flare of her psoriasis, plaque areas on hands, elbows, groin, abdomen, knee and back. Pruritic and weeping.  Allergic/Immunologic: Negative for environmental allergies and food allergies.  Neurological: Negative for dizziness, weakness and headaches.  Hematological: Does not bruise/bleed easily.  Psychiatric/Behavioral: Negative for  agitation, behavioral problems (depression) and hallucinations.    Physical Exam HENT:     Nose: Nose normal.     Mouth/Throat:     Mouth: Mucous membranes are moist.     Pharynx: Oropharynx is clear.  Cardiovascular:     Rate and Rhythm: Normal rate and regular rhythm.     Pulses: Normal pulses.     Heart sounds: Normal heart sounds.  Pulmonary:     Effort: Pulmonary effort is normal.     Breath  sounds: Normal breath sounds.  Abdominal:     General: Abdomen is flat. Bowel sounds are normal.     Palpations: Abdomen is soft.     Comments: Umbilical area erythematous, plaque psoriasis, weeping   Musculoskeletal:        General: Normal range of motion.     Cervical back: Normal range of motion.  Skin:    General: Skin is warm and moist.       Neurological:     General: No focal deficit present.     Mental Status: She is alert and oriented to person, place, and time. Mental status is at baseline.  Psychiatric:        Mood and Affect: Mood normal.        Thought Content: Thought content normal.        Judgment: Judgment normal.    Assessment/Plan: 1. Psoriasis Seen today for flare of psoriasis, complaining of intense itching. Steroid injection administered today in clinic to help with inflammation and instructed to start prednisone taper. Atarax also prescribed for itching as needed as this is causing her the most distress. Will review ANA and sed rate. Apply biafine cream to plaques to help alleviate irritation. - predniSONE (STERAPRED UNI-PAK 21 TAB) 10 MG (21) TBPK tablet; Use as directed  Dispense: 21 tablet; Refill: 0 - hydrOXYzine (ATARAX/VISTARIL) 25 MG tablet; Take 1 tablet (25 mg total) by mouth 3 (three) times daily as needed.  Dispense: 30 tablet; Refill: 0 - Sedimentation rate - ANA w/Reflex if Positive - methylPREDNISolone acetate (DEPO-MEDROL) injection 80 mg - emollient (BIAFINE) cream; Apply topically as needed.  Dispense: 454 g; Refill: 0  2. Abnormal thyroid blood test Abnormal T4 levels from latest blood work. Will recheck levels and adjust plan of care accordingly. - TSH + free T4  General Counseling: Anhthu verbalizes understanding of the findings of todays visit and agrees with plan of treatment. I have discussed any further diagnostic evaluation that may be needed or ordered today. We also reviewed her medications today. she has been encouraged to call the  office with any questions or concerns that should arise related to todays visit.   Orders Placed This Encounter  Procedures  . Sedimentation rate  . ANA w/Reflex if Positive  . TSH + free T4    Meds ordered this encounter  Medications  . predniSONE (STERAPRED UNI-PAK 21 TAB) 10 MG (21) TBPK tablet    Sig: Use as directed    Dispense:  21 tablet    Refill:  0  . hydrOXYzine (ATARAX/VISTARIL) 25 MG tablet    Sig: Take 1 tablet (25 mg total) by mouth 3 (three) times daily as needed.    Dispense:  30 tablet    Refill:  0  . methylPREDNISolone acetate (DEPO-MEDROL) injection 80 mg  . emollient (BIAFINE) cream    Sig: Apply topically as needed.    Dispense:  454 g    Refill:  0    Time spent: 30  Minutes  This patient was seen by Leeanne Deed AGNP-C in Collaboration with Dr Lyndon Code as a part of collaborative care agreement.  Lubertha Basque [facility] Internal Medicine

## 2019-12-21 ENCOUNTER — Encounter: Payer: Self-pay | Admitting: Nurse Practitioner

## 2019-12-21 ENCOUNTER — Encounter: Payer: Self-pay | Admitting: Hospice and Palliative Medicine

## 2019-12-27 LAB — ANA W/REFLEX IF POSITIVE: Anti Nuclear Antibody (ANA): NEGATIVE

## 2019-12-27 LAB — TSH+FREE T4
Free T4: 1.04 ng/dL (ref 0.82–1.77)
TSH: 2.12 u[IU]/mL (ref 0.450–4.500)

## 2019-12-27 LAB — SEDIMENTATION RATE: Sed Rate: 4 mm/hr (ref 0–32)

## 2019-12-30 ENCOUNTER — Telehealth: Payer: Self-pay

## 2019-12-30 NOTE — Telephone Encounter (Signed)
Lmom to confirm and screen for 01-03-20 ov. 

## 2020-01-03 ENCOUNTER — Ambulatory Visit (INDEPENDENT_AMBULATORY_CARE_PROVIDER_SITE_OTHER): Payer: Commercial Managed Care - PPO | Admitting: Nurse Practitioner

## 2020-01-03 ENCOUNTER — Other Ambulatory Visit: Payer: Self-pay

## 2020-01-03 ENCOUNTER — Encounter: Payer: Self-pay | Admitting: Nurse Practitioner

## 2020-01-03 VITALS — BP 109/80 | HR 88 | Temp 97.6°F | Resp 16 | Ht 63.0 in | Wt 183.0 lb

## 2020-01-03 DIAGNOSIS — L209 Atopic dermatitis, unspecified: Secondary | ICD-10-CM | POA: Diagnosis not present

## 2020-01-03 DIAGNOSIS — K219 Gastro-esophageal reflux disease without esophagitis: Secondary | ICD-10-CM

## 2020-01-03 DIAGNOSIS — Z0001 Encounter for general adult medical examination with abnormal findings: Secondary | ICD-10-CM | POA: Diagnosis not present

## 2020-01-03 DIAGNOSIS — G4726 Circadian rhythm sleep disorder, shift work type: Secondary | ICD-10-CM | POA: Diagnosis not present

## 2020-01-03 DIAGNOSIS — R300 Dysuria: Secondary | ICD-10-CM

## 2020-01-03 MED ORDER — TRIAMCINOLONE ACETONIDE 0.1 % EX CREA: 1.0000 "application " | TOPICAL_CREAM | Freq: Two times a day (BID) | CUTANEOUS | 3 refills | Status: DC

## 2020-01-03 MED ORDER — MODAFINIL 200 MG PO TABS: 100.0000 mg | ORAL_TABLET | Freq: Every day | ORAL | 2 refills | Status: DC

## 2020-01-03 NOTE — Progress Notes (Signed)
[facility] [address] [place], [place] [address]  Internal MEDICINE  Office Visit Note  Patient Name: Savannah [name]  [id number]  [id number]  Date of Service: 01/17/2020   Pt is here for routine health maintenance examination   Chief Complaint  Patient presents with  . Annual Exam  . Depression     The patient is here for health maintenance exam. She does have significant psoriasis which is now being managed through dermatology. She will be starting on biologic injection due to severity of situation.  The patient states that she got a letter from insurance stating that insurance would pay for only 60 more days of modafanil unless they heard from our office. She currently takes 100mg  daily. She is prescribed 200mg  tablets,taking 1/2 tablet daily. She does work night shift.   Current Medication: Outpatient Encounter Medications as of 01/03/2020  Medication Sig  . Crisaborole (EUCRISA) 2 % OINT Apply 1 application topically 2 (two) times daily.  . hydrOXYzine (ATARAX/VISTARIL) 25 MG tablet Take 1 tablet (25 mg total) by mouth 3 (three) times daily as needed.  levonorgestrel (MIRENA) 20 MCG/24HR IUD 1 each once by Intrauterine route.  . modafinil (PROVIGIL) 200 MG tablet Take 0.5 tablets (100 mg total) by mouth daily.  01/05/2020 omeprazole (PRILOSEC) 40 MG capsule TAKE 1 CAPSULE BY MOUTH EVERY DAY  . [DISCONTINUED] emollient (BIAFINE) cream Apply topically as needed.  . [DISCONTINUED] modafinil (PROVIGIL) 200 MG tablet Take 0.5 tablets (100 mg total) by mouth daily.  . [DISCONTINUED] predniSONE (STERAPRED UNI-PAK 21 TAB) 10 MG (21) TBPK tablet Use as directed  . [DISCONTINUED] triamcinolone (KENALOG) 0.025 % cream Apply 1 application topically 2 (two) times daily.  Marland Kitchen triamcinolone cream (KENALOG) 0.1 % Apply 1 application topically 2 (two) times daily.   No facility-administered encounter medications on file as of 01/03/2020.    Surgical History: Past Surgical History:   Procedure Laterality Date  . CARPAL TUNNEL RELEASE Right 09/29/2014   Procedure: CARPAL TUNNEL RELEASE;  Surgeon: 01/05/2020, MD;  Location: [facility] ORS;  Service: Orthopedics;  Laterality: Right;  . CESAREAN SECTION    . IUD placement    . MYRINGOTOMY WITH TUBE PLACEMENT      Medical History: Past Medical History:  Diagnosis Date  . Anxiety   . Depression   . Eardrum rupture, left   . Headache     Family History: Family History  Problem Relation Age of Onset  . Bone cancer Maternal Grandfather   . Healthy Mother       Review of Systems  Constitutional: Negative for activity change, fatigue, fever and unexpected weight change.  HENT: Negative for congestion, postnasal drip, rhinorrhea, sinus pain and sore throat.   Respiratory: Negative for cough, chest tightness, shortness of breath and wheezing.   Cardiovascular: Negative for chest pain, palpitations and leg swelling.  Gastrointestinal: Negative for abdominal pain, constipation, diarrhea, nausea and vomiting.       Well managed GERD type symptoms.   Endocrine: Negative for cold intolerance, heat intolerance, polydipsia and polyuria.  Genitourinary: Negative for dysuria, flank pain and urgency.  Musculoskeletal: Negative for arthralgias, back pain, gait problem, myalgias and neck pain.  Skin: Positive for rash. Negative for color change.       Flare of her psoriasis, plaque areas on hands, elbows, groin, abdomen, knee and back. Lesions are in process of healing. She is now seeing dermatology.   Allergic/Immunologic: Negative for environmental allergies and food allergies.  Neurological: Negative for dizziness, weakness  and headaches.  Hematological: Does not bruise/bleed easily.  Psychiatric/Behavioral: Positive for sleep disturbance. Negative for agitation, behavioral problems (depression) and hallucinations.    Today's Vitals   01/03/20 1421  BP: 109/80  Pulse: 88  Resp: 16  Temp: 97.6 F (36.4 C)  SpO2: 96%   Weight: 183 lb (83 kg)  Height: 5\' 3"  (1.6 m)   Body mass index is 32.42 kg/m.  Physical Exam Vitals and nursing note reviewed.  Constitutional:      General: She is not in acute distress.    Appearance: Normal appearance. She is well-developed. She is not diaphoretic.  HENT:     Head: Normocephalic and atraumatic.     Mouth/Throat:     Pharynx: No oropharyngeal exudate.  Eyes:     Pupils: Pupils are equal, round, and reactive to light.  Neck:     Thyroid: No thyromegaly.     Vascular: No JVD.     Trachea: No tracheal deviation.  Cardiovascular:     Rate and Rhythm: Normal rate and regular rhythm.     Pulses: Normal pulses.     Heart sounds: Normal heart sounds. No murmur heard.  No friction rub. No gallop.   Pulmonary:     Effort: Pulmonary effort is normal. No respiratory distress.     Breath sounds: Normal breath sounds. No wheezing or rales.  Chest:     Chest wall: No tenderness.  Abdominal:     General: Bowel sounds are normal.     Palpations: Abdomen is soft.     Tenderness: There is no abdominal tenderness.  Musculoskeletal:        General: Normal range of motion.     Cervical back: Normal range of motion and neck supple.  Lymphadenopathy:     Cervical: No cervical adenopathy.  Skin:    General: Skin is warm and dry.     Findings: Lesion present.     Comments: Continues to have plaque psoriasis on the limbs, chest, trunk, and back. Lesions are improving.   Neurological:     General: No focal deficit present.     Mental Status: She is alert and oriented to person, place, and time.     Cranial Nerves: No cranial nerve deficit.  Psychiatric:        Mood and Affect: Mood normal.        Behavior: Behavior normal.        Thought Content: Thought content normal.        Judgment: Judgment normal.      LABS: Recent Results (from the past 2160 hour(s))  Sedimentation rate     Status: None   Collection Time: 12/26/19 11:24 AM  Result Value Ref Range   Sed  Rate 4 0 - 32 mm/hr  ANA w/Reflex if Positive     Status: None   Collection Time: 12/26/19 11:24 AM  Result Value Ref Range   Anti Nuclear Antibody (ANA) Negative Negative  TSH + free T4     Status: None   Collection Time: 12/26/19 11:24 AM  Result Value Ref Range   TSH 2.120 0.450 - 4.500 uIU/mL   Free T4 1.04 0.82 - 1.77 ng/dL  UA/M w/rflx Culture, Routine     Status: None   Collection Time: 01/03/20  3:09 PM   Specimen: Urine   Urine  Result Value Ref Range   Specific Gravity, UA 1.024 1.005 - 1.030   pH, UA 6.0 5.0 - 7.5   Color, UA Yellow  Yellow   Appearance Ur Clear Clear   Leukocytes,UA Negative Negative   Protein,UA Negative Negative/Trace   Glucose, UA Negative Negative   Ketones, UA Negative Negative   RBC, UA Negative Negative   Bilirubin, UA Negative Negative   Urobilinogen, Ur 0.2 0.2 - 1.0 mg/dL   Nitrite, UA Negative Negative   Microscopic Examination Comment     Comment: Microscopic follows if indicated.   Microscopic Examination See below:     Comment: Microscopic was indicated and was performed.   Urinalysis Reflex Comment     Comment: This specimen will not reflex to a Urine Culture.  Microscopic Examination     Status: None   Collection Time: 01/03/20  3:09 PM   Urine  Result Value Ref Range   WBC, UA 0-5 0 - 5 /hpf   RBC 0-2 0 - 2 /hpf   Epithelial Cells (non renal) 0-10 0 - 10 /hpf   Casts None seen None seen /lpf   Bacteria, UA Few None seen/Few   Assessment/Plan: 1. Encounter for general adult medical examination with abnormal findings Annual health maintenance exam today.   2. Atopic dermatitis, unspecified type May use triamcinolone cream 0.1% to all effected areas. She should follow up with dermatology as scheduled.  - triamcinolone cream (KENALOG) 0.1 %; Apply 1 application topically 2 (two) times daily.  Dispense: 80 g; Refill: 3  3. Circadian rhythm sleep disorder, shift work type May conitnue to take provigil 100mg  daily as needed.   - modafinil (PROVIGIL) 200 MG tablet; Take 0.5 tablets (100 mg total) by mouth daily.  Dispense: 15 tablet; Refill: 2  4. Gastroesophageal reflux disease without esophagitis Stable. Continue to take prilosec as needed and as prescribed   5. Dysuria - UA/M w/rflx Culture, Routine  General Counseling: Raeden verbalizes understanding of the findings of todays visit and agrees with plan of treatment. I have discussed any further diagnostic evaluation that may be needed or ordered today. We also reviewed her medications today. she has been encouraged to call the office with any questions or concerns that should arise related to todays visit.    Counseling:  This patient was seen by Morrie Sheldon FNP Collaboration with Dr Vincent Gros as a part of collaborative care agreement  Orders Placed This Encounter  Procedures  . Microscopic Examination  . UA/M w/rflx Culture, Routine    Meds ordered this encounter  Medications  . triamcinolone cream (KENALOG) 0.1 %    Sig: Apply 1 application topically 2 (two) times daily.    Dispense:  80 g    Refill:  3    Changed per dermatology with new prescription already sent to pharmacy    Order Specific Question:   Supervising Provider    Answer:   Lyndon Code [[id number]]  . modafinil (PROVIGIL) 200 MG tablet    Sig: Take 0.5 tablets (100 mg total) by mouth daily.    Dispense:  15 tablet    Refill:  2    Order Specific Question:   Supervising Provider    Answer:   Lyndon Code [[id number]]    Total time spent: 45 Minutes  Time spent includes review of chart, medications, test results, and follow up plan with the patient.     Lyndon Code, MD  Internal Medicine

## 2020-01-04 LAB — UA/M W/RFLX CULTURE, ROUTINE
Bilirubin, UA: NEGATIVE
Glucose, UA: NEGATIVE
Ketones, UA: NEGATIVE
Leukocytes,UA: NEGATIVE
Nitrite, UA: NEGATIVE
Protein,UA: NEGATIVE
RBC, UA: NEGATIVE
Specific Gravity, UA: 1.024 (ref 1.005–1.030)
Urobilinogen, Ur: 0.2 mg/dL (ref 0.2–1.0)
pH, UA: 6 (ref 5.0–7.5)

## 2020-01-04 LAB — MICROSCOPIC EXAMINATION: Casts: NONE SEEN /lpf

## 2020-01-17 DIAGNOSIS — R300 Dysuria: Secondary | ICD-10-CM | POA: Insufficient documentation

## 2020-01-17 DIAGNOSIS — Z0001 Encounter for general adult medical examination with abnormal findings: Secondary | ICD-10-CM | POA: Insufficient documentation

## 2020-02-03 ENCOUNTER — Ambulatory Visit: Payer: Commercial Managed Care - PPO | Admitting: Nurse Practitioner

## 2020-02-10 ENCOUNTER — Other Ambulatory Visit: Payer: Self-pay | Admitting: Nurse Practitioner

## 2020-02-10 DIAGNOSIS — K219 Gastro-esophageal reflux disease without esophagitis: Secondary | ICD-10-CM

## 2020-03-22 ENCOUNTER — Telehealth: Payer: Self-pay

## 2020-03-22 NOTE — Telephone Encounter (Signed)
PA for Provigil (modafinil) was approved from 03/21/2020 to 03/21/2021

## 2020-04-02 ENCOUNTER — Other Ambulatory Visit: Payer: Self-pay

## 2020-04-02 ENCOUNTER — Encounter: Payer: Self-pay | Admitting: Internal Medicine

## 2020-04-02 ENCOUNTER — Ambulatory Visit (INDEPENDENT_AMBULATORY_CARE_PROVIDER_SITE_OTHER): Payer: Commercial Managed Care - PPO | Admitting: Internal Medicine

## 2020-04-02 VITALS — BP 130/80 | HR 91 | Resp 16 | Ht 63.0 in | Wt 187.0 lb

## 2020-04-02 DIAGNOSIS — F339 Major depressive disorder, recurrent, unspecified: Secondary | ICD-10-CM

## 2020-04-02 DIAGNOSIS — G4726 Circadian rhythm sleep disorder, shift work type: Secondary | ICD-10-CM

## 2020-04-02 MED ORDER — MODAFINIL 200 MG PO TABS: 200.0000 mg | ORAL_TABLET | Freq: Every day | ORAL | 2 refills | Status: DC

## 2020-04-02 NOTE — Progress Notes (Signed)
[facility] [place], [place] [address]  Pulmonary Sleep Medicine   Office Visit Note  Patient Name: Savannah [name] DOB: Jan 02, 1986 MRN [id number]  Date of Service: 04/02/2020  Complaints/HPI: Patient is here for follow-up she has history of shiftwork circadian disorder.  She has been having excessive fatigue and somnolence.  I reviewed her sleep habits.  It appears that she tries to stay awake until about 10:50 AM after she is finished at night of work.  Discussed with her the importance of coming home and going straight to sleep and keeping her room dark and also wearing blue blocker sunglasses when driving home from her shift.  She does use modafinil for the night.  She also states that she does take it only once in the evening time I discussed with her the importance of adjusting the timing of taking the medication.  She currently is on 100 mg dosage and she did need a renewal on her prescription.  During the weekend she states that she tries to normalize her sleep which I explained to her is probably not going to help with her excessive daytime somnolence.  I did review her sleep study which was done as a home study and it did not reveal significant sleep disordered breathing however she at the same time did not really have a very good sleep she states.  ROS  General: (-) fever, (-) chills, (-) night sweats, (-) weakness Skin: (-) rashes, (-) itching,. Eyes: (-) visual changes, (-) redness, (-) itching. Nose and Sinuses: (-) nasal stuffiness or itchiness, (-) postnasal drip, (-) nosebleeds, (-) sinus trouble. Mouth and Throat: (-) sore throat, (-) hoarseness. Neck: (-) swollen glands, (-) enlarged thyroid, (-) neck pain. Respiratory: - cough, (-) bloody sputum, - shortness of breath, - wheezing. Cardiovascular: - ankle swelling, (-) chest pain. Lymphatic: (-) lymph node enlargement. Neurologic: (-) numbness, (-) tingling. Psychiatric: (-) anxiety, (-)  depression   Current Medication: Outpatient Encounter Medications as of 04/02/2020  Medication Sig  . CIMZIA STARTER KIT 6 X 200 MG/ML KIT Inject into the skin.  Stasia Cavalier (EUCRISA) 2 % OINT Apply 1 application topically 2 (two) times daily.  . hydrOXYzine (ATARAX/VISTARIL) 25 MG tablet Take 1 tablet (25 mg total) by mouth 3 (three) times daily as needed.  Marland Kitchen levonorgestrel (MIRENA) 20 MCG/24HR IUD 1 each once by Intrauterine route.  . modafinil (PROVIGIL) 200 MG tablet Take 0.5 tablets (100 mg total) by mouth daily.  Marland Kitchen omeprazole (PRILOSEC) 40 MG capsule TAKE 1 CAPSULE BY MOUTH EVERY DAY  . triamcinolone cream (KENALOG) 0.1 % Apply 1 application topically 2 (two) times daily.   No facility-administered encounter medications on file as of 04/02/2020.    Surgical History: Past Surgical History:  Procedure Laterality Date  . CARPAL TUNNEL RELEASE Right 09/29/2014   Procedure: CARPAL TUNNEL RELEASE;  Surgeon: Christophe Louis, MD;  Location: [facility] ORS;  Service: Orthopedics;  Laterality: Right;  . CESAREAN SECTION    . IUD placement    . MYRINGOTOMY WITH TUBE PLACEMENT      Medical History: Past Medical History:  Diagnosis Date  . Anxiety   . Depression   . Eardrum rupture, left   . Headache     Family History: Family History  Problem Relation Age of Onset  . Bone cancer Maternal Grandfather   . Healthy Mother     Social History: Social History   Socioeconomic History  . Marital status: Married    Spouse name: Not on  file  . Number of children: Not on file  . Years of education: Not on file  . Highest education level: Not on file  Occupational History  . Not on file  Tobacco Use  . Smoking status: Former Smoker    Packs/day: 0.25    Types: Cigarettes    Quit date: 06/14/2018    Years since quitting: 1.8  . Smokeless tobacco: Never Used  Vaping Use  . Vaping Use: Never used  Substance and Sexual Activity  . Alcohol use: Not Currently  . Drug use: No  .  Sexual activity: Yes    Birth control/protection: I.U.D.  Other Topics Concern  . Not on file  Social History Narrative  . Not on file   Social Determinants of Health   Financial Resource Strain:   . Difficulty of Paying Living Expenses: Not on file  Food Insecurity:   . Worried About Charity fundraiser in the Last Year: Not on file  . Ran Out of Food in the Last Year: Not on file  Transportation Needs:   . Lack of Transportation (Medical): Not on file  . Lack of Transportation (Non-Medical): Not on file  Physical Activity:   . Days of Exercise per Week: Not on file  . Minutes of Exercise per Session: Not on file  Stress:   . Feeling of Stress : Not on file  Social Connections:   . Frequency of Communication with Friends and Family: Not on file  . Frequency of Social Gatherings with Friends and Family: Not on file  . Attends Religious Services: Not on file  . Active Member of Clubs or Organizations: Not on file  . Attends Archivist Meetings: Not on file  . Marital Status: Not on file  Intimate Partner Violence:   . Fear of Current or Ex-Partner: Not on file  . Emotionally Abused: Not on file  . Physically Abused: Not on file  . Sexually Abused: Not on file    Vital Signs: Blood pressure 130/80, pulse 91, resp. rate 16, height '5\' 3"'  (1.6 m), weight 187 lb (84.8 kg), SpO2 99 %.  Examination: General Appearance: The patient is well-developed, well-nourished, and in no distress. Skin: Gross inspection of skin unremarkable. Head: normocephalic, no gross deformities. Eyes: no gross deformities noted. ENT: ears appear grossly normal no exudates. Neck: Supple. No thyromegaly. No LAD. Respiratory: no rhonchi noted. Cardiovascular: Normal S1 and S2 without murmur or rub. Extremities: No cyanosis. pulses are equal. Neurologic: Alert and oriented. No involuntary movements.  LABS: Recent Results (from the past 2160 hour(s))  UA/M w/rflx Culture, Routine     Status:  None   Collection Time: 01/03/20  3:09 PM   Specimen: Urine   Urine  Result Value Ref Range   Specific Gravity, UA 1.024 1.005 - 1.030   pH, UA 6.0 5.0 - 7.5   Color, UA Yellow Yellow   Appearance Ur Clear Clear   Leukocytes,UA Negative Negative   Protein,UA Negative Negative/Trace   Glucose, UA Negative Negative   Ketones, UA Negative Negative   RBC, UA Negative Negative   Bilirubin, UA Negative Negative   Urobilinogen, Ur 0.2 0.2 - 1.0 mg/dL   Nitrite, UA Negative Negative   Microscopic Examination Comment     Comment: Microscopic follows if indicated.   Microscopic Examination See below:     Comment: Microscopic was indicated and was performed.   Urinalysis Reflex Comment     Comment: This specimen will not reflex to a  Urine Culture.  Microscopic Examination     Status: None   Collection Time: 01/03/20  3:09 PM   Urine  Result Value Ref Range   WBC, UA 0-5 0 - 5 /hpf   RBC 0-2 0 - 2 /hpf   Epithelial Cells (non renal) 0-10 0 - 10 /hpf   Casts None seen None seen /lpf   Bacteria, UA Few None seen/Few    Radiology: DG Shoulder Right  Result Date: 10/26/2019 CLINICAL DATA:  Right shoulder pain with movement. No known injury. Pain for 3 weeks. EXAM: RIGHT SHOULDER - 2+ VIEW COMPARISON:  None. FINDINGS: There is no evidence of fracture or dislocation. There is no evidence of arthropathy or other focal bone abnormality. Soft tissues are unremarkable. IMPRESSION: Negative radiographs of the right shoulder. Electronically Signed   By: Keith Rake M.D.   On: 10/26/2019 12:31   CT ABDOMEN W CONTRAST  Result Date: 10/26/2019 CLINICAL DATA:  Abdominal distention and constipation. EXAM: CT ABDOMEN WITH CONTRAST TECHNIQUE: Multidetector CT imaging of the abdomen was performed using the standard protocol following bolus administration of intravenous contrast. CONTRAST:  117m OMNIPAQUE IOHEXOL 300 MG/ML  SOLN COMPARISON:  None. FINDINGS: Lower chest: Unremarkable Hepatobiliary:  Unremarkable Pancreas: Unremarkable Spleen: Unremarkable Adrenals/Urinary Tract: Unremarkable Stomach/Bowel: Unremarkable. No dilated bowel or abnormal bowel wall thickening. Amount of stool in the visualized colon is upper normal. Vascular/Lymphatic: Unremarkable Other: No supplemental non-categorized findings. Musculoskeletal: Unremarkable IMPRESSION: A cause for the patient's symptoms is not identified. Electronically Signed   By: WVan ClinesM.D.   On: 10/26/2019 12:51    No results found.  No results found.    Assessment and Plan: Patient Active Problem List   Diagnosis Date Noted  . Encounter for general adult medical examination with abnormal findings 01/17/2020  . Dysuria 01/17/2020  . Constipation 10/18/2019  . Chronic right shoulder pain 10/18/2019  . Leg cramps 08/24/2019  . Upper back pain on left side 06/21/2019  . Need for prophylactic vaccination with combined diphtheria-tetanus-pertussis (DTaP) vaccine 03/18/2019  . Circadian rhythm sleep disorder, 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  . Generalized abdominal tenderness without rebound tenderness 02/03/2019  . Chronic low back pain 02/03/2019  . Excessive daytime sleepiness 08/29/2018  . Fatigue 08/29/2018  . Atopic dermatitis 08/17/2018  . Gastroesophageal reflux disease without esophagitis 08/17/2018  . Vitamin D deficiency 08/17/2018  . Carpal tunnel syndrome 06/01/2018  . Bacterial folliculitis 025/36/6440 . Irritable bowel syndrome with both constipation and diarrhea 06/01/2018  . Genital warts 03/26/2017  . Depression 01/11/2015  . Psoriasis 01/11/2015  . Ankle pain 01/11/2015    1. Shift work disorder the importance of regulating her sleep-wake cycle was discussed at length.  In addition to that she needs to work on making sure that she tries to keep her sleep-wake cycle regular even during her off days during the weekends.  She states that she and her partner are mostly at night hours anyway but she does try  to improve her weekends to is try to stay awake during the day so she is going to try to adjust this.  Dosage of modafinil can be increased to 200 mg a day at nighttime during her shift work 2. Morbid obesity her BMI is 33.13 she does need to work on losing weight this probably will help her overall in addition gaining weight and during night shift was also discussed at length. 3. Depression this is been under control she will continue with following up with her primary care  physician.  General Counseling: I have discussed the findings of the evaluation and examination with Savannah [name].  I have also discussed any further diagnostic evaluation thatmay be needed or ordered today. Savannah [name] verbalizes understanding of the findings of todays visit. We also reviewed her medications today and discussed drug interactions and side effects including but not limited excessive drowsiness and altered mental states. We also discussed that there is always a risk not just to her but also people around her. she has been encouraged to call the office with any questions or concerns that should arise related to todays visit.  No orders of the defined types were placed in this encounter.    Time spent: 63  I have personally obtained a history, examined the patient, evaluated laboratory and imaging results, formulated the assessment and plan and placed orders.    Allyne Gee, MD [facility] Pulmonary and Critical Care Sleep medicine

## 2020-04-02 NOTE — Patient Instructions (Signed)

## 2020-04-09 ENCOUNTER — Ambulatory Visit: Payer: Commercial Managed Care - PPO | Admitting: Nurse Practitioner

## 2020-05-30 ENCOUNTER — Other Ambulatory Visit: Payer: Self-pay

## 2020-05-30 DIAGNOSIS — K219 Gastro-esophageal reflux disease without esophagitis: Secondary | ICD-10-CM

## 2020-05-30 MED ORDER — OMEPRAZOLE 40 MG PO CPDR: 40.0000 mg | DELAYED_RELEASE_CAPSULE | Freq: Every day | ORAL | 0 refills | Status: DC

## 2020-06-19 ENCOUNTER — Telehealth: Payer: Self-pay

## 2020-06-19 NOTE — Telephone Encounter (Signed)
LMOM to reschedule Friday afternoon appt. 12-07-20 

## 2020-07-09 IMAGING — CT CT ABDOMEN W/ CM
2 of 4 series · 16 of 46 positions shown, 18 images · IV contrast (omnipaque)
Comparison: None.

CLINICAL DATA: Abdominal distention and constipation.

EXAM:
CT ABDOMEN WITH CONTRAST
TECHNIQUE: Multidetector CT imaging of the abdomen was performed using the
standard protocol following bolus administration of intravenous
contrast.
CONTRAST:  100mL OMNIPAQUE IOHEXOL 300 MG/ML  SOLN

[Series 2: abd pelvis 5.00 · axial · 0.79mm/px · z∈[-1310,-1060]mm · 13 of 56 slices shown, 15 images]
[im 3/56  soft-tissue]
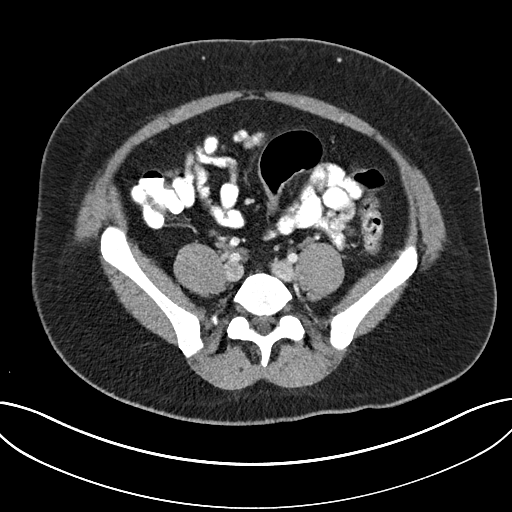
[im 3/56  bone]
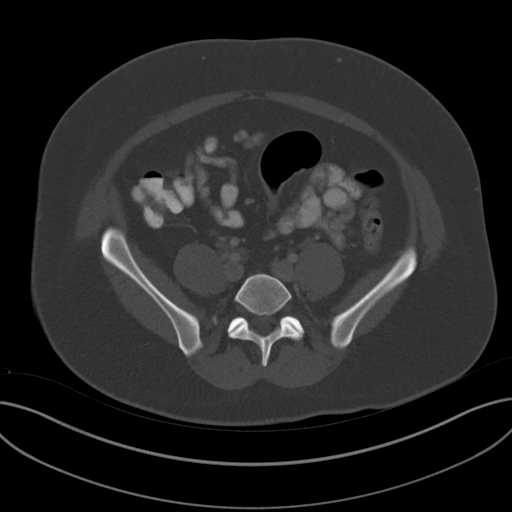
[im 9/56  soft-tissue]
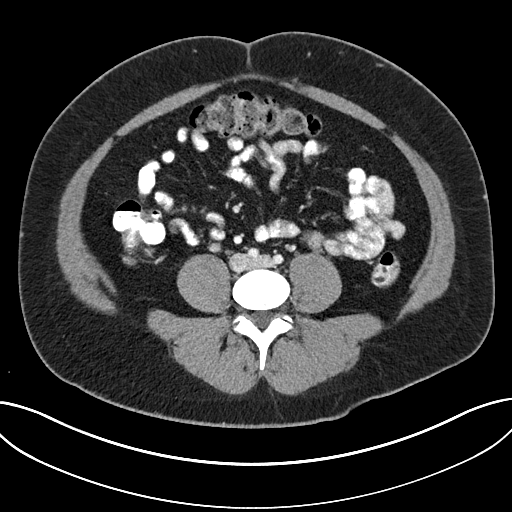
[im 12/56  soft-tissue]
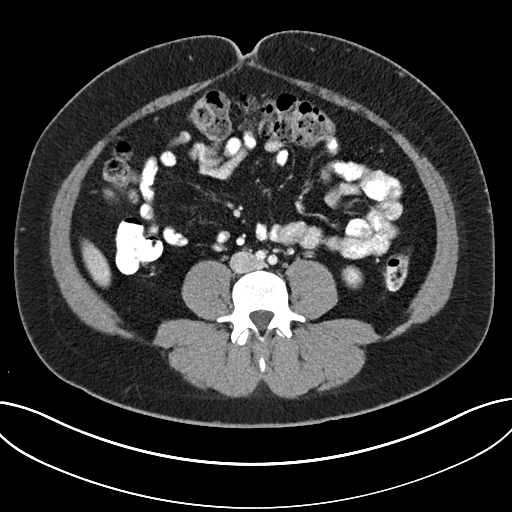
[im 17/56  soft-tissue]
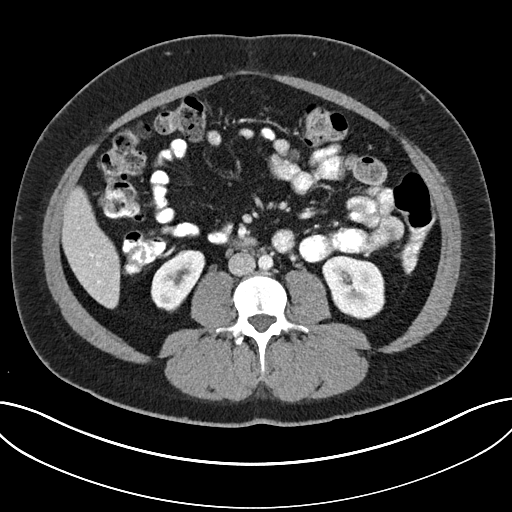
[im 20/56  soft-tissue]
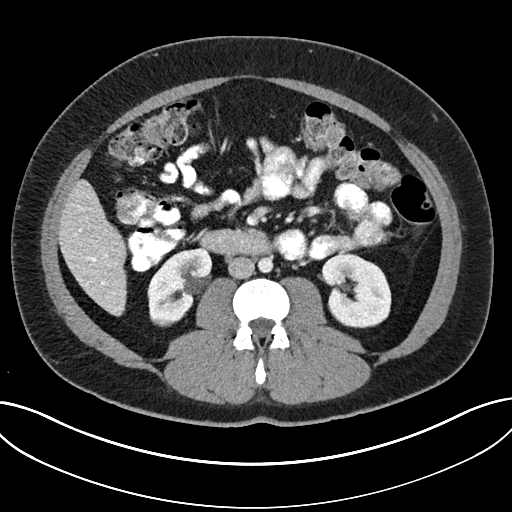
[im 25/56  soft-tissue]
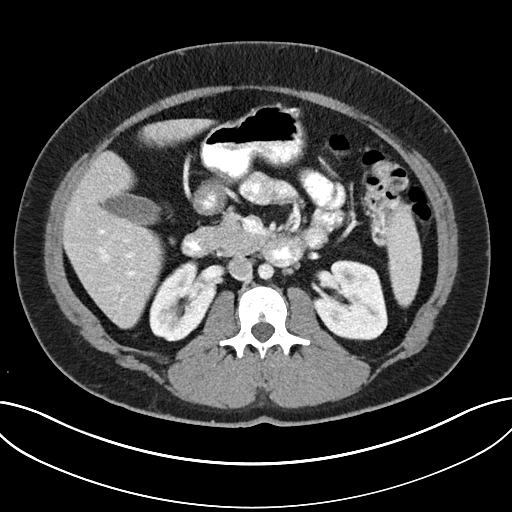
[im 28/56  soft-tissue]
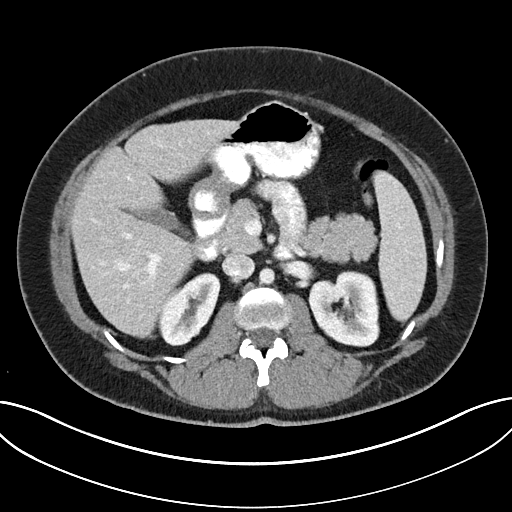
[im 31/56  soft-tissue]
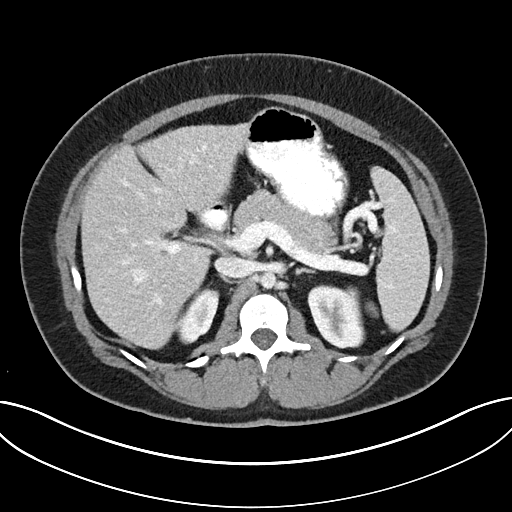
[im 36/56  soft-tissue]
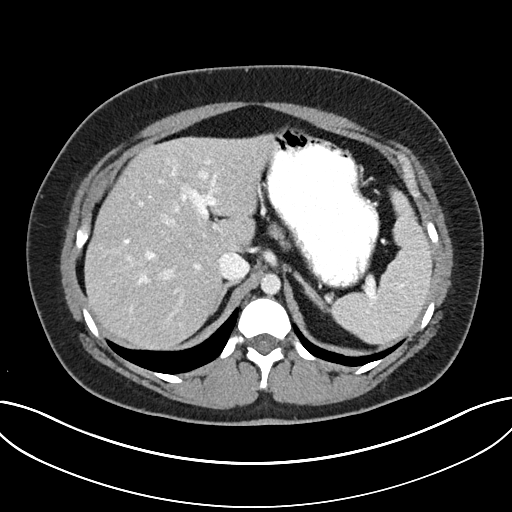
[im 36/56  bone]
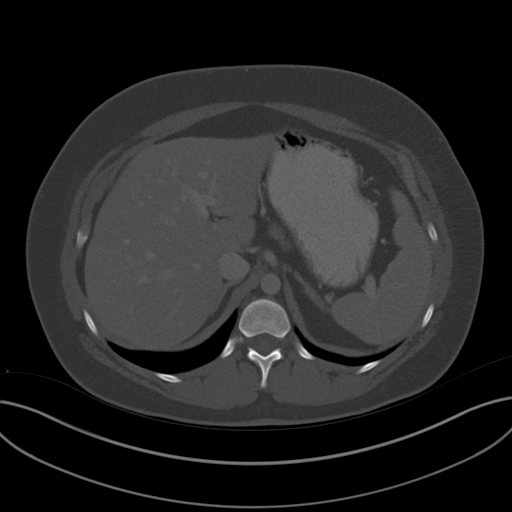
[im 39/56  soft-tissue]
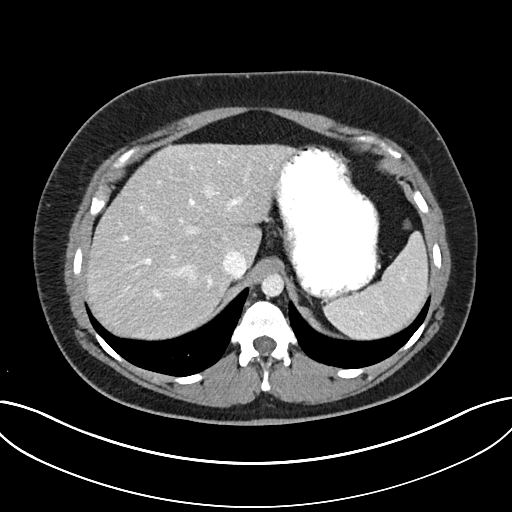
[im 45/56  soft-tissue]
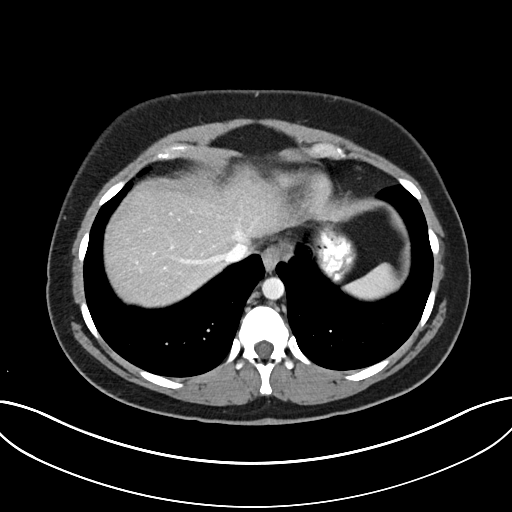
[im 47/56  soft-tissue]
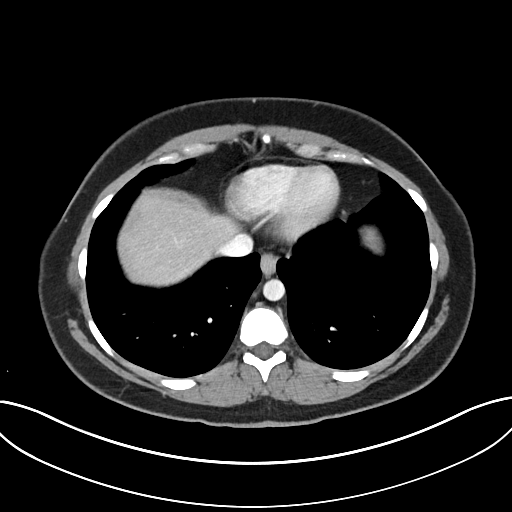
[im 53/56  soft-tissue]
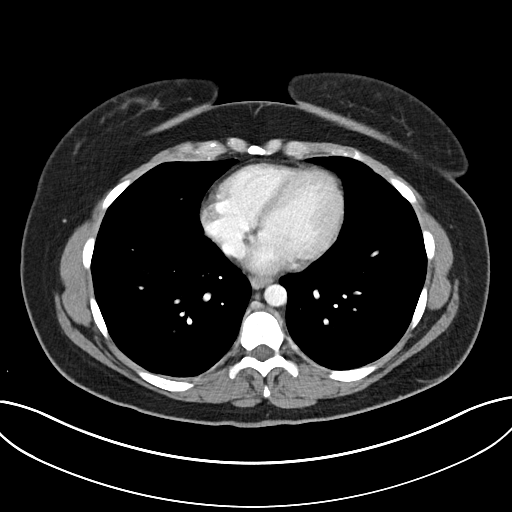

[Series 4: coronals abd pelvis 2.00 cor · coronal · 0.56mm/px · 3 of 160 slices shown]
[im 54/160  soft-tissue]
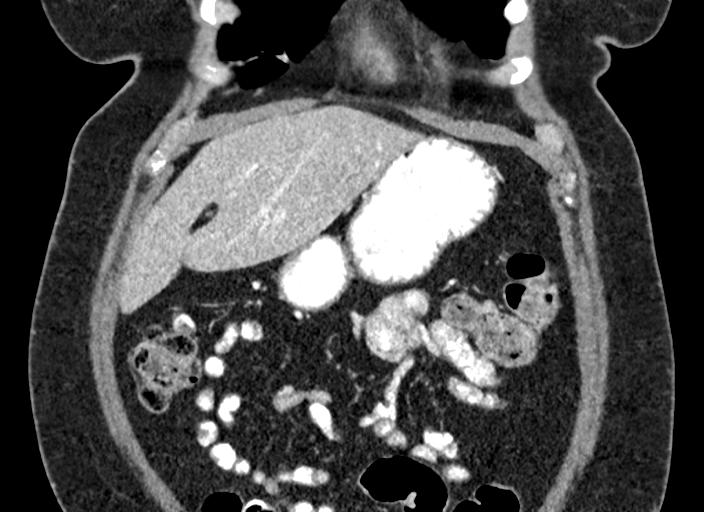
[im 71/160  soft-tissue]
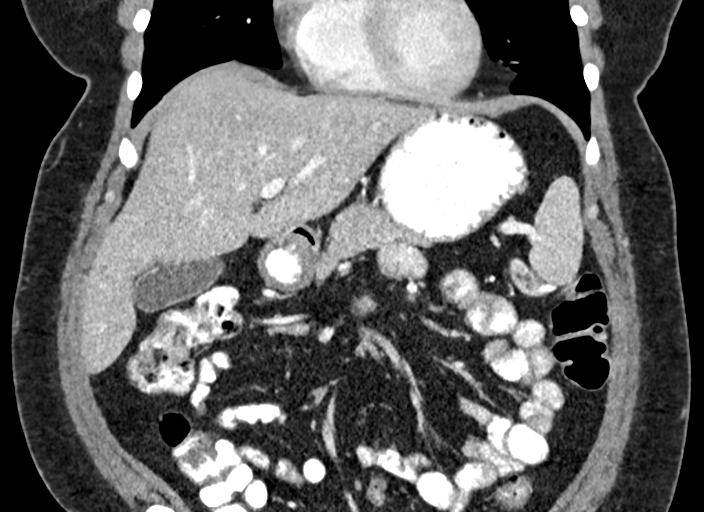
[im 89/160  soft-tissue]
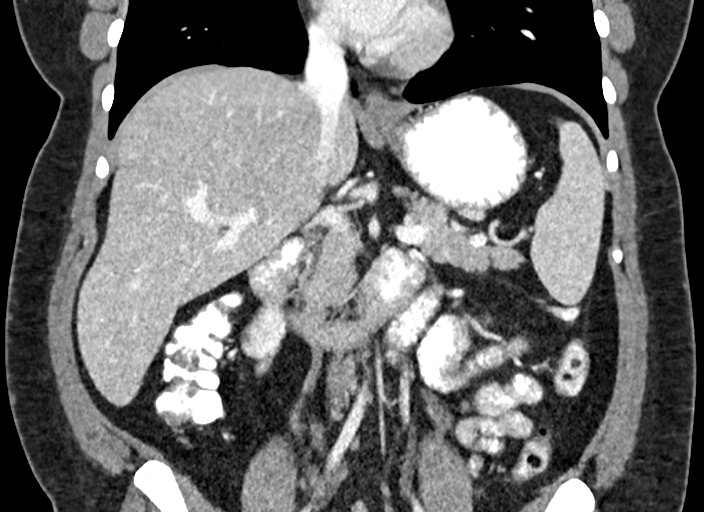

[16 of 46 positions shown; findings below may reference images not displayed]

FINDINGS: Lower chest: Unremarkable

Hepatobiliary: Unremarkable

Pancreas: Unremarkable

Spleen: Unremarkable

Adrenals/Urinary Tract: Unremarkable

Stomach/Bowel: Unremarkable. No dilated bowel or abnormal bowel wall
thickening. Amount of stool in the visualized colon is upper normal.

Vascular/Lymphatic: Unremarkable

Other: No supplemental non-categorized findings.

Musculoskeletal: Unremarkable
IMPRESSION: A cause for the patient's symptoms is not identified.

## 2020-07-19 ENCOUNTER — Ambulatory Visit
Admission: EM | Admit: 2020-07-19 | Discharge: 2020-07-19 | Disposition: A | Discharge status: Home / Self Care | Payer: Commercial Managed Care - PPO | Attending: Physician Assistant | Admitting: Physician Assistant

## 2020-07-19 ENCOUNTER — Other Ambulatory Visit: Payer: Self-pay

## 2020-07-19 ENCOUNTER — Encounter: Payer: Self-pay | Admitting: Emergency Medicine

## 2020-07-19 DIAGNOSIS — H9202 Otalgia, left ear: Secondary | ICD-10-CM

## 2020-07-19 DIAGNOSIS — H60502 Unspecified acute noninfective otitis externa, left ear: Secondary | ICD-10-CM | POA: Diagnosis not present

## 2020-07-19 MED ORDER — CIPROFLOXACIN-DEXAMETHASONE 0.3-0.1 % OT SUSP
4.0000 [drp] | Freq: Two times a day (BID) | OTIC | 0 refills | Status: AC
Start: 2020-07-19 — End: 2020-07-26

## 2020-07-19 NOTE — Discharge Instructions (Signed)
OTITIS EXTERNA: On exam, you have Swimmer's ear. Please see attached discharge notes and instructions including how to use ear drops. Avoid swimming until symptoms resolve. May take Tylenol/Motrin for ear pain, but ear drops should begin to improve pain in 1-3 days. Return or visit PCP if symptoms worsen or do not improve with prescribed ear drops.

## 2020-07-19 NOTE — ED Triage Notes (Signed)
Patient c/o left ear pain that started one month ago. Denies any other symptoms.

## 2020-07-19 NOTE — ED Provider Notes (Signed)
[facility]    CSN: [id number] Arrival date & time: 07/19/20  0825      History   Chief Complaint Chief Complaint  Patient presents with  . Ear Pain    HPI Savannah [name] is a 35 y.o. female presenting for approximately 1 month history of gradually worsening left-sided ear pain.  She says that it does hurt to touch the ear.  She has had a little bit of drainage and crusting of the ear.  Admits to some muffled hearing in her ear feeling "wet and sticky."  Has tried hydrogen peroxide drops without relief.  Patient admits to recurrent ear infections.  She denies fever, fatigue, body aches, sore throat, nasal congestion or cough.  No dizziness or headaches.  No other complaints or concerns today.  HPI  Past Medical History:  Diagnosis Date  . Anxiety   . Depression   . Eardrum rupture, left   . Headache     Patient Active Problem List   Diagnosis Date Noted  . Encounter for general adult medical examination with abnormal findings 01/17/2020  . Dysuria 01/17/2020  . Constipation 10/18/2019  . Chronic right shoulder pain 10/18/2019  . Leg cramps 08/24/2019  . Upper back pain on left side 06/21/2019  . Need for prophylactic vaccination with combined diphtheria-tetanus-pertussis (DTaP) vaccine 03/18/2019  . Circadian rhythm sleep disorder, 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  . Generalized abdominal tenderness without rebound tenderness 02/03/2019  . Chronic low back pain 02/03/2019  . Excessive daytime sleepiness 08/29/2018  . Fatigue 08/29/2018  . Atopic dermatitis 08/17/2018  . Gastroesophageal reflux disease without esophagitis 08/17/2018  . Vitamin D deficiency 08/17/2018  . Carpal tunnel syndrome 06/01/2018  . Bacterial folliculitis 14/97/0263  . Irritable bowel syndrome with both constipation and diarrhea 06/01/2018  . Genital warts 03/26/2017  . Depression 01/11/2015  . Psoriasis 01/11/2015  . Ankle pain 01/11/2015    Past Surgical History:  Procedure  Laterality Date  . CARPAL TUNNEL RELEASE Right 09/29/2014   Procedure: CARPAL TUNNEL RELEASE;  Surgeon: Christophe Louis, MD;  Location: [facility] ORS;  Service: Orthopedics;  Laterality: Right;  . CESAREAN SECTION    . IUD placement    . MYRINGOTOMY WITH TUBE PLACEMENT      OB History    Gravida  3   Para  3   Term  2   Preterm  1   AB      Living  3     SAB      IAB      Ectopic      Multiple      Live Births  3            Home Medications    Prior to Admission medications   Medication Sig Start Date End Date Taking? Authorizing Provider  ciprofloxacin-dexamethasone (CIPRODEX) OTIC suspension Place 4 drops into the left ear 2 (two) times daily for 7 days. 07/19/20 07/26/20 Yes Danton Clap, PA-C  levonorgestrel (MIRENA) 20 MCG/24HR IUD 1 each once by Intrauterine route.   Yes [provider]  modafinil (PROVIGIL) 200 MG tablet Take 1 tablet (200 mg total) by mouth daily. 04/02/20  Yes Allyne Gee, MD  omeprazole (PRILOSEC) 40 MG capsule Take 1 capsule (40 mg total) by mouth daily. 05/30/20  Yes Luiz Ochoa, NP  CIMZIA STARTER KIT 6 X 200 MG/ML KIT Inject into the skin. 01/13/20   [provider]  Crisaborole (EUCRISA) 2 % OINT Apply 1 application topically 2 (  two) times daily. 10/18/19   Ronnell Freshwater, NP  hydrOXYzine (ATARAX/VISTARIL) 25 MG tablet Take 1 tablet (25 mg total) by mouth 3 (three) times daily as needed. 12/20/19   Luiz Ochoa, NP  triamcinolone cream (KENALOG) 0.1 % Apply 1 application topically 2 (two) times daily. 01/03/20   Ronnell Freshwater, NP    Family History Family History  Problem Relation Age of Onset  . Bone cancer Maternal Grandfather   . Healthy Mother     Social History Social History   Tobacco Use  . Smoking status: Former Smoker    Packs/day: 0.25    Types: Cigarettes    Quit date: 06/14/2018    Years since quitting: 2.0  . Smokeless tobacco: Never Used  Vaping Use  . Vaping Use: Never used   Substance Use Topics  . Alcohol use: Not Currently  . Drug use: No     Allergies   Ibuprofen   Review of Systems Review of Systems  Constitutional: Negative for fatigue and fever.  HENT: Positive for ear discharge, ear pain and hearing loss. Negative for congestion, rhinorrhea, sinus pain and sore throat.   Respiratory: Negative for cough and shortness of breath.   Skin: Negative for rash.  Neurological: Negative for dizziness, weakness and headaches.  Hematological: Negative for adenopathy.     Physical Exam Triage Vital Signs ED Triage Vitals [07/19/20 0839]  Enc Vitals Group     BP      Pulse      Resp      Temp      Temp src      SpO2      Weight 193 lb (87.5 kg)     Height '5\' 3"'  (1.6 m)     Head Circumference      Peak Flow      Pain Score 2     Pain Loc      Pain Edu?      Excl. in [place]?    No data found.  Updated Vital Signs BP 107/78 (BP Location: Right Arm)   Pulse 78   Temp 98.3 F (36.8 C) (Oral)   Resp 18   Ht '5\' 3"'  (1.6 m)   Wt 193 lb (87.5 kg)   SpO2 100%   BMI 34.19 kg/m       Physical Exam Vitals and nursing note reviewed.  Constitutional:      General: She is not in acute distress.    Appearance: Normal appearance. She is not ill-appearing or toxic-appearing.  HENT:     Head: Normocephalic and atraumatic.     Right Ear: Tympanic membrane, ear canal and external ear normal.     Left Ear: Tympanic membrane and external ear normal. Drainage (light yellow thick debris in EAC), swelling (of EAC) and tenderness (of pinna and EAC) present.     Nose: Nose normal.     Mouth/Throat:     Mouth: Mucous membranes are moist.     Pharynx: Oropharynx is clear.  Eyes:     General: No scleral icterus.       Right eye: No discharge.        Left eye: No discharge.     Conjunctiva/sclera: Conjunctivae normal.  Cardiovascular:     Rate and Rhythm: Normal rate and regular rhythm.     Heart sounds: Normal heart sounds.  Pulmonary:     Effort:  Pulmonary effort is normal. No respiratory distress.     Breath sounds: Normal  breath sounds.  Musculoskeletal:     Cervical back: Neck supple.  Skin:    General: Skin is dry.  Neurological:     General: No focal deficit present.     Mental Status: She is alert. Mental status is at baseline.     Motor: No weakness.     Gait: Gait normal.  Psychiatric:        Mood and Affect: Mood normal.        Behavior: Behavior normal.        Thought Content: Thought content normal.      UC Treatments / Results  Labs (all labs ordered are listed, but only abnormal results are displayed) Labs Reviewed - No data to display  EKG   Radiology No results found.  Procedures Procedures (including critical care time)  Medications Ordered in UC Medications - No data to display  Initial Impression / Assessment and Plan / UC Course  I have reviewed the triage vital signs and the nursing notes.  Pertinent labs & imaging results that were available during my care of the patient were reviewed by me and considered in my medical decision making (see chart for details).   35 year old female presenting for left-sided ear pain.  Her exam is consistent with otitis externa of the left ear.  Treating with Ciprodex.  Also encouraged use of Tylenol for pain relief and warm compresses.  Discussed how to use eardrops and importance of not getting her ears wet until infection resolves.  Advised her to follow-up with PCP or our clinic as needed for any worsening symptoms or if not getting better with this medication.   Final Clinical Impressions(s) / UC Diagnoses   Final diagnoses:  Acute otitis externa of left ear, unspecified type  Left ear pain     Discharge Instructions     OTITIS EXTERNA: On exam, you have Swimmer's ear. Please see attached discharge notes and instructions including how to use ear drops. Avoid swimming until symptoms resolve. May take Tylenol/Motrin for ear pain, but ear drops should  begin to improve pain in 1-3 days. Return or visit PCP if symptoms worsen or do not improve with prescribed ear drops.     ED Prescriptions    Medication Sig Dispense Auth. Provider   ciprofloxacin-dexamethasone (CIPRODEX) OTIC suspension Place 4 drops into the left ear 2 (two) times daily for 7 days. 7.5 mL Danton Clap, PA-C     PDMP not reviewed this encounter.   Danton Clap, PA-C 07/19/20 [phone]

## 2020-08-19 ENCOUNTER — Other Ambulatory Visit: Payer: Self-pay | Admitting: Hospice and Palliative Medicine

## 2020-08-19 DIAGNOSIS — K219 Gastro-esophageal reflux disease without esophagitis: Secondary | ICD-10-CM

## 2020-08-20 MED ORDER — OMEPRAZOLE 40 MG PO CPDR: 40.0000 mg | DELAYED_RELEASE_CAPSULE | Freq: Every day | ORAL | 0 refills | Status: DC

## 2020-10-01 ENCOUNTER — Other Ambulatory Visit: Payer: Self-pay

## 2020-10-01 ENCOUNTER — Encounter: Payer: Self-pay | Admitting: Physician Assistant

## 2020-10-01 ENCOUNTER — Ambulatory Visit: Payer: Commercial Managed Care - PPO | Admitting: Physician Assistant

## 2020-10-01 VITALS — BP 106/86 | HR 105 | Temp 97.5°F | Resp 16 | Ht 63.0 in | Wt 188.2 lb

## 2020-10-01 DIAGNOSIS — R000 Tachycardia, unspecified: Secondary | ICD-10-CM

## 2020-10-01 DIAGNOSIS — F339 Major depressive disorder, recurrent, unspecified: Secondary | ICD-10-CM

## 2020-10-01 DIAGNOSIS — G4726 Circadian rhythm sleep disorder, shift work type: Secondary | ICD-10-CM

## 2020-10-01 NOTE — Progress Notes (Addendum)
[facility] [place], [place] [address]  Pulmonary Sleep Medicine   Office Visit Note  Patient Name: Savannah [name] DOB: 10/20/1985 MRN [id number]  Date of Service: 10/03/2020  Complaints/HPI: Patient is here for routine pulmonary follow up for daytime somnolence. Increased to 219m modafanil and it made a big difference. Does wear off sometimes in the last 2 hours of work but otherwise doing well. She works third shift and gets home 6-7am. Tries to go to sleep around 9am and wakes up 3pm and may lay back down depending on the day. Gets ready for work around 8[place] sleep all day and night or could stay up, it all depends on the day. Hard to keep schedule set on weekends bc this is the time to get things done and spend with family. She has 4 kids. Does not take modafanil on days off.  She works at the [employer] and only guaranteed one day off.  Still has a refill on modafanil currently.  ROS  General: (-) fever, (-) chills, (-) night sweats, (-) weakness Skin: (-) rashes, (-) itching,. Eyes: (-) visual changes, (-) redness, (-) itching. Nose and Sinuses: (-) nasal stuffiness or itchiness, (-) postnasal drip, (-) nosebleeds, (-) sinus trouble. Mouth and Throat: (-) sore throat, (-) hoarseness. Neck: (-) swollen glands, (-) enlarged thyroid, (-) neck pain. Respiratory: - cough, (-) bloody sputum, - shortness of breath, - wheezing. Cardiovascular: - ankle swelling, (-) chest pain. Lymphatic: (-) lymph node enlargement. Neurologic: (-) numbness, (-) tingling. Psychiatric: (-) anxiety, (-) depression   Current Medication: Outpatient Encounter Medications as of 10/01/2020  Medication Sig  . Crisaborole (EUCRISA) 2 % OINT Apply 1 application topically 2 (two) times daily.  .Marland Kitchenlevonorgestrel (MIRENA) 20 MCG/24HR IUD 1 each once by Intrauterine route.  . modafinil (PROVIGIL) 200 MG tablet Take 1 tablet (200 mg total) by mouth daily.  .Marland Kitchenomeprazole  (PRILOSEC) 40 MG capsule Take 1 capsule (40 mg total) by mouth daily.  . [DISCONTINUED] CIMZIA STARTER KIT 6 X 200 MG/ML KIT Inject into the skin. (Patient not taking: Reported on 10/01/2020)  . [DISCONTINUED] hydrOXYzine (ATARAX/VISTARIL) 25 MG tablet Take 1 tablet (25 mg total) by mouth 3 (three) times daily as needed. (Patient not taking: Reported on 10/01/2020)  . [DISCONTINUED] triamcinolone cream (KENALOG) 0.1 % Apply 1 application topically 2 (two) times daily. (Patient not taking: Reported on 10/01/2020)   No facility-administered encounter medications on file as of 10/01/2020.    Surgical History: Past Surgical History:  Procedure Laterality Date  . CARPAL TUNNEL RELEASE Right 09/29/2014   Procedure: CARPAL TUNNEL RELEASE;  Surgeon: CChristophe Louis MD;  Location: [facility] ORS;  Service: Orthopedics;  Laterality: Right;  . CESAREAN SECTION    . IUD placement    . MYRINGOTOMY WITH TUBE PLACEMENT      Medical History: Past Medical History:  Diagnosis Date  . Anxiety   . Depression   . Eardrum rupture, left   . Headache     Family History: Family History  Problem Relation Age of Onset  . Bone cancer Maternal Grandfather   . Healthy Mother     Social History: Social History   Socioeconomic History  . Marital status: Married    Spouse name: Not on file  . Number of children: Not on file  . Years of education: Not on file  . Highest education level: Not on file  Occupational History  . Not on file  Tobacco Use  . Smoking  status: Former Smoker    Packs/day: 0.25    Types: Cigarettes    Quit date: 06/14/2018    Years since quitting: 2.3  . Smokeless tobacco: Never Used  Vaping Use  . Vaping Use: Never used  Substance and Sexual Activity  . Alcohol use: Not Currently  . Drug use: No  . Sexual activity: Yes    Birth control/protection: I.U.D.  Other Topics Concern  . Not on file  Social History Narrative  . Not on file   Social Determinants of Health    Financial Resource Strain: Not on file  Food Insecurity: Not on file  Transportation Needs: Not on file  Physical Activity: Not on file  Stress: Not on file  Social Connections: Not on file  Intimate Partner Violence: Not on file    Vital Signs: Blood pressure 106/86, pulse (!) 105, temperature (!) 97.5 F (36.4 C), resp. rate 16, height _0  (1.6 m), weight 188 lb 3.2 oz (85.4 kg), SpO2 98 %.  Examination: General Appearance: The patient is well-developed, well-nourished, and in no distress. Skin: Gross inspection of skin unremarkable. Head: normocephalic, no gross deformities. Eyes: no gross deformities noted. ENT: ears appear grossly normal no exudates. Neck: Supple. No thyromegaly. No LAD. Respiratory: Lungs clear to auscultation bilaterally. Cardiovascular: Normal S1 and S2 without murmur or rub. Extremities: No cyanosis. pulses are equal. Neurologic: Alert and oriented. No involuntary movements.  LABS: No results found for this or any previous visit (from the past 2160 hour(s)).  Radiology: No results found.  No results found.  No results found.    Assessment and Plan: Patient Active Problem List   Diagnosis Date Noted  . Encounter for general adult medical examination with abnormal findings 01/17/2020  . Dysuria 01/17/2020  . Constipation 10/18/2019  . Chronic right shoulder pain 10/18/2019  . Leg cramps 08/24/2019  . Upper back pain on left side 06/21/2019  . Need for prophylactic vaccination with combined diphtheria-tetanus-pertussis (DTaP) vaccine 03/18/2019  . Circadian rhythm sleep disorder, 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  . Generalized abdominal tenderness without rebound tenderness 02/03/2019  . Chronic low back pain 02/03/2019  . Excessive daytime sleepiness 08/29/2018  . Fatigue 08/29/2018  . Atopic dermatitis 08/17/2018  . Gastroesophageal reflux disease without esophagitis 08/17/2018  . Vitamin D deficiency 08/17/2018  . Carpal tunnel syndrome  06/01/2018  . Bacterial folliculitis 67/04/4579  . Irritable bowel syndrome with both constipation and diarrhea 06/01/2018  . Genital warts 03/26/2017  . Depression 01/11/2015  . Psoriasis 01/11/2015  . Ankle pain 01/11/2015    1. Circadian rhythm sleep disorder, shift work type Educated to stick to regular schedule with sleep-wake cycle, including on weekends. She is doing well on 233m Modafanil during work hours though it does wear off in the last hour or two. Discussed breaking dose in half and staggering each half for longer efficacy as long as it does not interfere with when she needs to go to sleep, but pt is happy with current dosing schedule and plans to stick to 2021mat start of shift. -requested new script today however refill still present on current script  2. Depression, recurrent ([facility], followed by PCP  3. Tachycardia Noted on exam today, can be drug related if persistent, will explore further esp if pt is symptomatic, echo and EKG  General Counseling: I have discussed the findings of the evaluation and examination with Savannah [name] I have also discussed any further diagnostic evaluation thatmay be needed or ordered today. Savannah Pinaerbalizes  understanding of the findings of todays visit. We also reviewed her medications today and discussed drug interactions and side effects including but not limited excessive drowsiness and altered mental states. We also discussed that there is always a risk not just to her but also people around her. she has been encouraged to call the office with any questions or concerns that should arise related to todays visit.  No orders of the defined types were placed in this encounter.    Time spent: 30  I have personally obtained a history, examined the patient, evaluated laboratory and imaging results, formulated the assessment and plan and placed orders. This patient was seen by Drema [place], PA-C in collaboration with Dr. Devona Konig as a  part of collaborative care agreement.     Allyne Gee, MD [facility] Pulmonary and Critical Care Sleep medicine

## 2020-10-03 NOTE — Patient Instructions (Signed)

## 2020-10-18 ENCOUNTER — Other Ambulatory Visit: Payer: Self-pay

## 2020-10-18 DIAGNOSIS — K219 Gastro-esophageal reflux disease without esophagitis: Secondary | ICD-10-CM

## 2020-10-18 MED ORDER — OMEPRAZOLE 40 MG PO CPDR: 40.0000 mg | DELAYED_RELEASE_CAPSULE | Freq: Every day | ORAL | 0 refills | Status: DC

## 2020-11-05 ENCOUNTER — Other Ambulatory Visit: Payer: Self-pay

## 2020-11-05 ENCOUNTER — Ambulatory Visit
Admission: EM | Admit: 2020-11-05 | Discharge: 2020-11-05 | Disposition: A | Discharge status: Home / Self Care | Payer: Commercial Managed Care - PPO | Attending: Family Medicine | Admitting: Family Medicine

## 2020-11-05 DIAGNOSIS — K047 Periapical abscess without sinus: Secondary | ICD-10-CM | POA: Diagnosis not present

## 2020-11-05 MED ORDER — AMOXICILLIN-POT CLAVULANATE 875-125 MG PO TABS: 1.0000 | ORAL_TABLET | Freq: Two times a day (BID) | ORAL | 0 refills | Status: DC

## 2020-11-05 NOTE — ED Triage Notes (Signed)
Patient states that she broke her upper right tooth on Wednesday. States that the swelling started yesterday. States that area is very painful.

## 2020-11-05 NOTE — ED Provider Notes (Signed)
[facility]    CSN: [id number] Arrival date & time: 11/05/20  0900      History   Chief Complaint Chief Complaint  Patient presents with   Dental Pain   HPI  35 year old female presents with the above complaint.  She states that she broke her tooth (right upper molar) on Wednesday of last week.  Developed extensive right-sided facial swelling yesterday.  Called her dentist and was unable to be seen until tomorrow.  She was advised to come here for evaluation.  No fever.  She states that her pain is mild to moderate.  No relieving factors.  No other reported symptoms.  No other complaints.  Past Medical History:  Diagnosis Date   Anxiety    Depression    Eardrum rupture, left    Headache     Patient Active Problem List   Diagnosis Date Noted   Encounter for general adult medical examination with abnormal findings 01/17/2020   Dysuria 01/17/2020   Constipation 10/18/2019   Chronic right shoulder pain 10/18/2019   Leg cramps 08/24/2019   Upper back pain on left side 06/21/2019   Need for prophylactic vaccination with combined diphtheria-tetanus-pertussis (DTaP) vaccine 03/18/2019   Circadian rhythm sleep disorder, 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   Generalized abdominal tenderness without rebound tenderness 02/03/2019   Chronic low back pain 02/03/2019   Excessive daytime sleepiness 08/29/2018   Fatigue 08/29/2018   Atopic dermatitis 08/17/2018   Gastroesophageal reflux disease without esophagitis 08/17/2018   Vitamin D deficiency 08/17/2018   Carpal tunnel syndrome 06/01/2018   Bacterial folliculitis 06/01/2018   Irritable bowel syndrome with both constipation and diarrhea 06/01/2018   Genital warts 03/26/2017   Depression 01/11/2015   Psoriasis 01/11/2015   Ankle pain 01/11/2015    Past Surgical History:  Procedure Laterality Date   CARPAL TUNNEL RELEASE Right 09/29/2014   Procedure: CARPAL TUNNEL RELEASE;  Surgeon: Myra Rude, MD;  Location: [facility]  ORS;  Service: Orthopedics;  Laterality: Right;   CESAREAN SECTION     IUD placement     MYRINGOTOMY WITH TUBE PLACEMENT      OB History     Gravida  3   Para  3   Term  2   Preterm  1   AB      Living  3      SAB      IAB      Ectopic      Multiple      Live Births  3            Home Medications    Prior to Admission medications   Medication Sig Start Date End Date Taking? Authorizing Provider  amoxicillin-clavulanate (AUGMENTIN) 875-125 MG tablet Take 1 tablet by mouth 2 (two) times daily. 11/05/20  Yes Maveric Debono G, DO  Crisaborole (EUCRISA) 2 % OINT Apply 1 application topically 2 (two) times daily. 10/18/19  Yes Carlean Jews, NP  levonorgestrel (MIRENA) 20 MCG/24HR IUD 1 each once by Intrauterine route.   Yes [provider]  modafinil (PROVIGIL) 200 MG tablet Take 1 tablet (200 mg total) by mouth daily. 04/02/20  Yes Yevonne Pax, MD  omeprazole (PRILOSEC) 40 MG capsule Take 1 capsule (40 mg total) by mouth daily. 10/18/20  Yes McDonough, Lauren K, PA-C  Vitamin D, Ergocalciferol, (DRISDOL) 1.25 MG (50000 UNIT) CAPS capsule Take 50,000 Units by mouth once a week. 10/08/20  Yes [provider]    Family History Family History  Problem Relation Age of Onset   Bone cancer Maternal Grandfather    Healthy Mother     Social History Social History   Tobacco Use   Smoking status: Former    Packs/day: 0.25    Pack years: 0.00    Types: Cigarettes    Quit date: 06/14/2018    Years since quitting: 2.3   Smokeless tobacco: Never  Vaping Use   Vaping Use: Never used  Substance Use Topics   Alcohol use: Not Currently   Drug use: No     Allergies   Ibuprofen   Review of Systems Review of Systems  Constitutional:  Negative for fever.  HENT:  Positive for dental problem.     Physical Exam Triage Vital Signs ED Triage Vitals  Enc Vitals Group     BP 11/05/20 0916 116/87     Pulse Rate 11/05/20 0916 88     Resp 11/05/20  0916 18     Temp 11/05/20 0916 98.6 F (37 C)     Temp Source 11/05/20 0916 Oral     SpO2 11/05/20 0916 100 %     Weight 11/05/20 0914 187 lb (84.8 kg)     Height 11/05/20 0914 5\' 3"  (1.6 m)     Head Circumference --      Peak Flow --      Pain Score 11/05/20 0913 7     Pain Loc --      Pain Edu? --      Excl. in GC? --    Updated Vital Signs BP 116/87 (BP Location: Right Arm)   Pulse 88   Temp 98.6 F (37 C) (Oral)   Resp 18   Ht 5\' 3"  (1.6 m)   Wt 84.8 kg   SpO2 100%   BMI 33.13 kg/m   Visual Acuity Right Eye Distance:   Left Eye Distance:   Bilateral Distance:    Right Eye Near:   Left Eye Near:    Bilateral Near:     Physical Exam Vitals and nursing note reviewed.  Constitutional:      General: She is not in acute distress.    Appearance: Normal appearance. She is not ill-appearing.  HENT:     Head:     Comments: Patient has right-sided facial swelling.    Mouth/Throat:      Comments: Labeled tooth is broken and extensively decayed. Eyes:     General:        Right eye: No discharge.        Left eye: No discharge.     Conjunctiva/sclera: Conjunctivae normal.  Cardiovascular:     Rate and Rhythm: Normal rate and regular rhythm.  Pulmonary:     Effort: Pulmonary effort is normal.     Breath sounds: Normal breath sounds. No wheezing, rhonchi or rales.  Neurological:     Mental Status: She is alert.  Psychiatric:        Mood and Affect: Mood normal.        Behavior: Behavior normal.     UC Treatments / Results  Labs (all labs ordered are listed, but only abnormal results are displayed) Labs Reviewed - No data to display  EKG   Radiology No results found.  Procedures Procedures (including critical care time)  Medications Ordered in UC Medications - No data to display  Initial Impression / Assessment and Plan / UC Course  I have reviewed the triage vital signs and the nursing notes.  Pertinent labs &  imaging results that were available  during my care of the patient were reviewed by me and considered in my medical decision making (see chart for details).    35 year old female presents with dental abscess. Treating with Augmentin. Advised to see dentist.  Final Clinical Impressions(s) / UC Diagnoses   Final diagnoses:  Dental abscess     Discharge Instructions      Antibiotic as prescribed.  Please see your dentist.  Take care  Dr. Adriana Simas    ED Prescriptions     Medication Sig Dispense Auth. Provider   amoxicillin-clavulanate (AUGMENTIN) 875-125 MG tablet Take 1 tablet by mouth 2 (two) times daily. 20 tablet Tommie Sams, DO      PDMP not reviewed this encounter.   Tommie Sams, DO 11/05/20 1003

## 2020-11-05 NOTE — Discharge Instructions (Addendum)
Antibiotic as prescribed.  Please see your dentist.  Take care  Dr. Adriana Simas

## 2020-11-20 ENCOUNTER — Other Ambulatory Visit: Payer: Self-pay | Admitting: Physician Assistant

## 2020-11-20 DIAGNOSIS — K219 Gastro-esophageal reflux disease without esophagitis: Secondary | ICD-10-CM

## 2020-11-26 ENCOUNTER — Other Ambulatory Visit: Payer: Self-pay | Admitting: Internal Medicine

## 2020-11-26 DIAGNOSIS — G4726 Circadian rhythm sleep disorder, shift work type: Secondary | ICD-10-CM

## 2020-12-07 ENCOUNTER — Encounter: Payer: Commercial Managed Care - PPO | Admitting: Physician Assistant

## 2020-12-07 ENCOUNTER — Encounter: Payer: Commercial Managed Care - PPO | Admitting: Hospice and Palliative Medicine

## 2021-01-23 ENCOUNTER — Telehealth: Payer: Self-pay

## 2021-01-23 NOTE — Telephone Encounter (Signed)
Left vm to confirm 01/24/21 appointment-Toni 

## 2021-01-24 ENCOUNTER — Other Ambulatory Visit: Payer: Self-pay

## 2021-01-24 ENCOUNTER — Encounter: Payer: Self-pay | Admitting: Physician Assistant

## 2021-01-24 ENCOUNTER — Ambulatory Visit (INDEPENDENT_AMBULATORY_CARE_PROVIDER_SITE_OTHER): Payer: Commercial Managed Care - PPO | Admitting: Physician Assistant

## 2021-01-24 DIAGNOSIS — L409 Psoriasis, unspecified: Secondary | ICD-10-CM | POA: Diagnosis not present

## 2021-01-24 DIAGNOSIS — R300 Dysuria: Secondary | ICD-10-CM

## 2021-01-24 DIAGNOSIS — G4726 Circadian rhythm sleep disorder, shift work type: Secondary | ICD-10-CM | POA: Diagnosis not present

## 2021-01-24 DIAGNOSIS — Z01419 Encounter for gynecological examination (general) (routine) without abnormal findings: Secondary | ICD-10-CM | POA: Diagnosis not present

## 2021-01-24 DIAGNOSIS — Z0001 Encounter for general adult medical examination with abnormal findings: Secondary | ICD-10-CM | POA: Diagnosis not present

## 2021-01-24 DIAGNOSIS — F339 Major depressive disorder, recurrent, unspecified: Secondary | ICD-10-CM

## 2021-01-24 DIAGNOSIS — E559 Vitamin D deficiency, unspecified: Secondary | ICD-10-CM

## 2021-01-24 DIAGNOSIS — E782 Mixed hyperlipidemia: Secondary | ICD-10-CM

## 2021-01-24 DIAGNOSIS — F411 Generalized anxiety disorder: Secondary | ICD-10-CM

## 2021-01-24 DIAGNOSIS — R5383 Other fatigue: Secondary | ICD-10-CM

## 2021-01-24 MED ORDER — SERTRALINE HCL 25 MG PO TABS: 25.0000 mg | ORAL_TABLET | Freq: Every day | ORAL | 3 refills | Status: DC

## 2021-01-24 NOTE — Progress Notes (Signed)
[facility] [place], [place] [address]  Internal MEDICINE  Office Visit Note  Patient Name: Savannah [name]  [id number]  [id number]  Date of Service: 01/26/2021  Chief Complaint  Patient presents with   Annual Exam   Anxiety   Depression     HPI Pt is here for routine health maintenance examination -Has not had much energy and cant sleep. Gets anxious about things she needs to get done. Has felt some depression as well. Has tried medications before and had some side effects. Did ok on klonopin as needed but was worried about the addictive nature of this. Does admit she is paranoid. Based on prior list has tried lexapro, buspar, effexor, hydroxyzine. Thinks she tried alternative meds from prior office--lorazepam, which knocked her out. -States she does not like opening up to people and is not interested in counseling but did discuss trying an alternative med and considering psych referral if not improving still -States the modafanil does not affect her sleep, but still feel tired. -Due to update labs -Was seeing derm for psoriasis,but states she is thinking about switching derm offices  Current Medication: Outpatient Encounter Medications as of 01/24/2021  Medication Sig   Crisaborole (EUCRISA) 2 % OINT Apply 1 application topically 2 (two) times daily.   levonorgestrel (MIRENA) 20 MCG/24HR IUD 1 each once by Intrauterine route.   modafinil (PROVIGIL) 200 MG tablet TAKE 1 TABLET BY MOUTH EVERY DAY   omeprazole (PRILOSEC) 40 MG capsule TAKE 1 CAPSULE (40 MG TOTAL) BY MOUTH DAILY.   sertraline (ZOLOFT) 25 MG tablet Take 1 tablet (25 mg total) by mouth daily.   Vitamin D, Ergocalciferol, (DRISDOL) 1.25 MG (50000 UNIT) CAPS capsule Take 50,000 Units by mouth once a week.   [DISCONTINUED] amoxicillin-clavulanate (AUGMENTIN) 875-125 MG tablet Take 1 tablet by mouth 2 (two) times daily.   No facility-administered encounter medications on file as of 01/24/2021.     Surgical History: Past Surgical History:  Procedure Laterality Date   CARPAL TUNNEL RELEASE Right 09/29/2014   Procedure: CARPAL TUNNEL RELEASE;  Surgeon: Christophe Louis, MD;  Location: [facility] ORS;  Service: Orthopedics;  Laterality: Right;   CESAREAN SECTION     IUD placement     MYRINGOTOMY WITH TUBE PLACEMENT      Medical History: Past Medical History:  Diagnosis Date   Anxiety    Depression    Eardrum rupture, left    Headache     Family History: Family History  Problem Relation Age of Onset   Bone cancer Maternal Grandfather    Healthy Mother       Review of Systems  Constitutional:  Positive for fatigue. Negative for chills and unexpected weight change.  HENT:  Positive for postnasal drip. Negative for congestion, rhinorrhea, sneezing and sore throat.   Eyes:  Negative for redness.  Respiratory:  Negative for cough, chest tightness and shortness of breath.   Cardiovascular:  Negative for chest pain and palpitations.  Gastrointestinal:  Negative for abdominal pain, constipation, diarrhea, nausea and vomiting.  Genitourinary:  Negative for dysuria and frequency.  Musculoskeletal:  Negative for arthralgias, back pain, joint swelling and neck pain.  Skin:  Negative for rash.  Neurological: Negative.  Negative for tremors and numbness.  Hematological:  Negative for adenopathy. Does not bruise/bleed easily.  Psychiatric/Behavioral:  Positive for behavioral problems (Depression) and sleep disturbance. Negative for suicidal ideas. The patient is nervous/anxious.     Vital Signs: BP 130/88   Pulse 99   Temp  97.8 F (36.6 C)   Resp 16   Ht '5\' 3"'  (1.6 m)   Wt 175 lb (79.4 kg)   SpO2 98%   BMI 31.00 kg/m    Physical Exam Vitals and nursing note reviewed.  Constitutional:      General: She is not in acute distress.    Appearance: She is well-developed. She is obese. She is not diaphoretic.  HENT:     Head: Normocephalic and atraumatic.     Right Ear:  External ear normal.     Left Ear: External ear normal.     Nose: Nose normal.     Mouth/Throat:     Pharynx: No oropharyngeal exudate.  Eyes:     General: No scleral icterus.       Right eye: No discharge.        Left eye: No discharge.     Conjunctiva/sclera: Conjunctivae normal.     Pupils: Pupils are equal, round, and reactive to light.  Neck:     Thyroid: No thyromegaly.     Vascular: No JVD.     Trachea: No tracheal deviation.  Cardiovascular:     Rate and Rhythm: Normal rate and regular rhythm.     Heart sounds: Normal heart sounds. No murmur heard.   No friction rub. No gallop.  Pulmonary:     Effort: Pulmonary effort is normal. No respiratory distress.     Breath sounds: Normal breath sounds. No stridor. No wheezing or rales.  Chest:     Chest wall: No tenderness.  Breasts:    Right: Normal. No mass.     Left: Normal. No mass.  Abdominal:     General: Bowel sounds are normal. There is no distension.     Palpations: Abdomen is soft. There is no mass.     Tenderness: There is no abdominal tenderness. There is no guarding or rebound.  Musculoskeletal:        General: No tenderness or deformity. Normal range of motion.     Cervical back: Normal range of motion and neck supple.  Lymphadenopathy:     Cervical: No cervical adenopathy.  Skin:    General: Skin is warm and dry.     Coloration: Skin is not pale.     Findings: No erythema or rash.  Neurological:     Mental Status: She is alert.     Cranial Nerves: No cranial nerve deficit.     Motor: No abnormal muscle tone.     Coordination: Coordination normal.     Deep Tendon Reflexes: Reflexes are normal and symmetric.  Psychiatric:        Behavior: Behavior normal.        Thought Content: Thought content normal.        Judgment: Judgment normal.     LABS: Recent Results (from the past 2160 hour(s))  UA/M w/rflx Culture, Routine     Status: None   Collection Time: 01/24/21  3:28 PM   Specimen: Urine   Urine   Result Value Ref Range   Specific Gravity, UA 1.022 1.005 - 1.030   pH, UA 6.0 5.0 - 7.5   Color, UA Yellow Yellow   Appearance Ur Clear Clear   Leukocytes,UA Negative Negative   Protein,UA Negative Negative/Trace   Glucose, UA Negative Negative   Ketones, UA Negative Negative   RBC, UA Negative Negative   Bilirubin, UA Negative Negative   Urobilinogen, Ur 0.2 0.2 - 1.0 mg/dL   Nitrite, UA Negative Negative  Microscopic Examination Comment     Comment: Microscopic follows if indicated.   Microscopic Examination See below:     Comment: Microscopic was indicated and was performed.   Urinalysis Reflex Comment     Comment: This specimen will not reflex to a Urine Culture.  Microscopic Examination     Status: None   Collection Time: 01/24/21  3:28 PM   Urine  Result Value Ref Range   WBC, UA 0-5 0 - 5 /hpf   RBC 0-2 0 - 2 /hpf   Epithelial Cells (non renal) 0-10 0 - 10 /hpf   Casts None seen None seen /lpf   Bacteria, UA None seen None seen/Few  CBC w/Diff/Platelet     Status: Abnormal   Collection Time: 01/25/21 11:12 AM  Result Value Ref Range   WBC 8.5 3.4 - 10.8 x10E3/uL   RBC 4.82 3.77 - 5.28 x10E6/uL   Hemoglobin 14.6 11.1 - 15.9 g/dL   Hematocrit 43.1 34.0 - 46.6 %   MCV 89 79 - 97 fL   MCH 30.3 26.6 - 33.0 pg   MCHC 33.9 31.5 - 35.7 g/dL   RDW 12.4 11.7 - 15.4 %   Platelets 345 150 - 450 x10E3/uL   Neutrophils 50 Not Estab. %   Lymphs 40 Not Estab. %   Monocytes 7 Not Estab. %   Eos 2 Not Estab. %   Basos 1 Not Estab. %   Neutrophils Absolute 4.3 1.4 - 7.0 x10E3/uL   Lymphocytes Absolute 3.4 (H) 0.7 - 3.1 x10E3/uL   Monocytes Absolute 0.6 0.1 - 0.9 x10E3/uL   EOS (ABSOLUTE) 0.2 0.0 - 0.4 x10E3/uL   Basophils Absolute 0.1 0.0 - 0.2 x10E3/uL   Immature Granulocytes 0 Not Estab. %   Immature Grans (Abs) 0.0 0.0 - 0.1 x10E3/uL  Comprehensive metabolic panel     Status: None   Collection Time: 01/25/21 11:12 AM  Result Value Ref Range   Glucose 79 65 - 99 mg/dL    BUN 19 6 - 20 mg/dL   Creatinine, Ser 0.81 0.57 - 1.00 mg/dL   eGFR 98 >59 mL/min/1.73   BUN/Creatinine Ratio 23 9 - 23   Sodium 140 134 - 144 mmol/L   Potassium 4.0 3.5 - 5.2 mmol/L   Chloride 102 96 - 106 mmol/L   CO2 21 20 - 29 mmol/L   Calcium 9.5 8.7 - 10.2 mg/dL   Total Protein 7.3 6.0 - 8.5 g/dL   Albumin 4.5 3.8 - 4.8 g/dL   Globulin, Total 2.8 1.5 - 4.5 g/dL   Albumin/Globulin Ratio 1.6 1.2 - 2.2   Bilirubin Total 0.3 0.0 - 1.2 mg/dL   Alkaline Phosphatase 77 44 - 121 IU/L   AST 18 0 - 40 IU/L   ALT 18 0 - 32 IU/L  TSH + free T4     Status: None   Collection Time: 01/25/21 11:12 AM  Result Value Ref Range   TSH 2.120 0.450 - 4.500 uIU/mL   Free T4 1.07 0.82 - 1.77 ng/dL  Lipid Panel With LDL/HDL Ratio     Status: Abnormal   Collection Time: 01/25/21 11:12 AM  Result Value Ref Range   Cholesterol, Total 209 (H) 100 - 199 mg/dL   Triglycerides 149 0 - 149 mg/dL   HDL 37 (L) >39 mg/dL   VLDL Cholesterol Cal 27 5 - 40 mg/dL   LDL Chol Calc (NIH) 145 (H) 0 - 99 mg/dL   LDL/HDL Ratio 3.9 (H) 0.0 - 3.2 ratio  Comment:                                     LDL/HDL Ratio                                             Men  Women                               1/2 Avg.Risk  1.0    1.5                                   Avg.Risk  3.6    3.2                                2X Avg.Risk  6.2    5.0                                3X Avg.Risk  8.0    6.1   VITAMIN D 25 Hydroxy (Vit-D Deficiency, Fractures)     Status: None   Collection Time: 01/25/21 11:12 AM  Result Value Ref Range   Vit D, 25-Hydroxy 54.5 30.0 - 100.0 ng/mL    Comment: Vitamin D deficiency has been defined by the [place] practice guideline as a level of serum 25-OH vitamin D less than 20 ng/mL (1,2). The Endocrine Society went on to further define vitamin D insufficiency as a level between 21 and 29 ng/mL (2). 1. IOM (Institute of Medicine). 2010. Dietary reference     intakes for calcium and D. [place]: The    [employer]. 2. Holick MF, Binkley [place], Bischoff-Ferrari HA, et al.    Evaluation, treatment, and prevention of vitamin D    deficiency: an Endocrine Society clinical practice    guideline. JCEM. 2011 Jul; 96(7):1911-30.         Assessment/Plan: 1. Encounter for general adult medical examination with abnormal findings CPE performed, routine labs ordered, up-to-date with Pap through OB/GYN office  2. Circadian rhythm sleep disorder, shift work type May continue take modafinil and patient educated on keeping to set sleep schedule and increasing total sleep time to at least 7 hours  3. Depression, recurrent ([place]) Will start on low-dose Zoloft and titrate up as needed.  May need to consider psych referral if side effects or no improvement. Will call if side effects or worsening symptoms. - sertraline (ZOLOFT) 25 MG tablet; Take 1 tablet (25 mg total) by mouth daily.  Dispense: 30 tablet; Refill: 3  4. GAD (generalized anxiety disorder) Will start on low-dose Zoloft and titrate up as needed.  May need to consider psych referral if side effects or no improvement - sertraline (ZOLOFT) 25 MG tablet; Take 1 tablet (25 mg total) by mouth daily.  Dispense: 30 tablet; Refill: 3  5. Psoriasis Followed by dermatology however patient states she may be switching dermatology offices  6. Vitamin D deficiency - VITAMIN D 25 Hydroxy (Vit-D Deficiency, Fractures)  7. Mixed hyperlipidemia - Lipid Panel  With LDL/HDL Ratio  8. Other fatigue - CBC w/Diff/Platelet - Comprehensive metabolic panel - TSH + free T4  9. Visit for Gyn exam Breast exam performed  10. Dysuria - UA/M w/rflx Culture, Routine   General Counseling: Niamh verbalizes understanding of the findings of todays visit and agrees with plan of treatment. I have discussed any further diagnostic evaluation that may be needed or ordered today. We also reviewed her  medications today. she has been encouraged to call the office with any questions or concerns that should arise related to todays visit.    Counseling:    Orders Placed This Encounter  Procedures   Microscopic Examination   UA/M w/rflx Culture, Routine   CBC w/Diff/Platelet   Comprehensive metabolic panel   TSH + free T4   Lipid Panel With LDL/HDL Ratio   VITAMIN D 25 Hydroxy (Vit-D Deficiency, Fractures)    Meds ordered this encounter  Medications   sertraline (ZOLOFT) 25 MG tablet    Sig: Take 1 tablet (25 mg total) by mouth daily.    Dispense:  30 tablet    Refill:  3     This patient was seen by Drema Dallas, PA-C in collaboration with Dr. Clayborn Bigness as a part of collaborative care agreement.  Total time spent:40 Minutes  Time spent includes review of chart, medications, test results, and follow up plan with the patient.     Lavera Guise, MD  Internal Medicine

## 2021-01-25 LAB — UA/M W/RFLX CULTURE, ROUTINE
Bilirubin, UA: NEGATIVE
Glucose, UA: NEGATIVE
Ketones, UA: NEGATIVE
Leukocytes,UA: NEGATIVE
Nitrite, UA: NEGATIVE
Protein,UA: NEGATIVE
RBC, UA: NEGATIVE
Specific Gravity, UA: 1.022 (ref 1.005–1.030)
Urobilinogen, Ur: 0.2 mg/dL (ref 0.2–1.0)
pH, UA: 6 (ref 5.0–7.5)

## 2021-01-25 LAB — MICROSCOPIC EXAMINATION
Bacteria, UA: NONE SEEN
Casts: NONE SEEN /lpf

## 2021-01-26 LAB — CBC WITH DIFFERENTIAL/PLATELET
Basophils Absolute: 0.1 10*3/uL (ref 0.0–0.2)
Basos: 1 %
EOS (ABSOLUTE): 0.2 10*3/uL (ref 0.0–0.4)
Eos: 2 %
Hematocrit: 43.1 % (ref 34.0–46.6)
Hemoglobin: 14.6 g/dL (ref 11.1–15.9)
Immature Grans (Abs): 0 10*3/uL (ref 0.0–0.1)
Immature Granulocytes: 0 %
Lymphocytes Absolute: 3.4 10*3/uL — ABNORMAL HIGH (ref 0.7–3.1)
Lymphs: 40 %
MCH: 30.3 pg (ref 26.6–33.0)
MCHC: 33.9 g/dL (ref 31.5–35.7)
MCV: 89 fL (ref 79–97)
Monocytes Absolute: 0.6 10*3/uL (ref 0.1–0.9)
Monocytes: 7 %
Neutrophils Absolute: 4.3 10*3/uL (ref 1.4–7.0)
Neutrophils: 50 %
Platelets: 345 10*3/uL (ref 150–450)
RBC: 4.82 x10E6/uL (ref 3.77–5.28)
RDW: 12.4 % (ref 11.7–15.4)
WBC: 8.5 10*3/uL (ref 3.4–10.8)

## 2021-01-26 LAB — COMPREHENSIVE METABOLIC PANEL
ALT: 18 IU/L (ref 0–32)
AST: 18 IU/L (ref 0–40)
Albumin/Globulin Ratio: 1.6 (ref 1.2–2.2)
Albumin: 4.5 g/dL (ref 3.8–4.8)
Alkaline Phosphatase: 77 IU/L (ref 44–121)
BUN/Creatinine Ratio: 23 (ref 9–23)
BUN: 19 mg/dL (ref 6–20)
Bilirubin Total: 0.3 mg/dL (ref 0.0–1.2)
CO2: 21 mmol/L (ref 20–29)
Calcium: 9.5 mg/dL (ref 8.7–10.2)
Chloride: 102 mmol/L (ref 96–106)
Creatinine, Ser: 0.81 mg/dL (ref 0.57–1.00)
Globulin, Total: 2.8 g/dL (ref 1.5–4.5)
Glucose: 79 mg/dL (ref 65–99)
Potassium: 4 mmol/L (ref 3.5–5.2)
Sodium: 140 mmol/L (ref 134–144)
Total Protein: 7.3 g/dL (ref 6.0–8.5)
eGFR: 98 mL/min/{1.73_m2} (ref >59)

## 2021-01-26 LAB — LIPID PANEL WITH LDL/HDL RATIO
Cholesterol, Total: 209 mg/dL — ABNORMAL HIGH (ref 100–199)
HDL: 37 mg/dL — ABNORMAL LOW (ref >39)
LDL Chol Calc (NIH): 145 mg/dL — ABNORMAL HIGH (ref 0–99)
LDL/HDL Ratio: 3.9 ratio — ABNORMAL HIGH (ref 0.0–3.2)
Triglycerides: 149 mg/dL (ref 0–149)
VLDL Cholesterol Cal: 27 mg/dL (ref 5–40)

## 2021-01-26 LAB — TSH+FREE T4
Free T4: 1.07 ng/dL (ref 0.82–1.77)
TSH: 2.12 u[IU]/mL (ref 0.450–4.500)

## 2021-01-26 LAB — VITAMIN D 25 HYDROXY (VIT D DEFICIENCY, FRACTURES): Vit D, 25-Hydroxy: 54.5 ng/mL (ref 30.0–100.0)

## 2021-02-01 ENCOUNTER — Other Ambulatory Visit: Payer: Self-pay | Admitting: Internal Medicine

## 2021-02-01 DIAGNOSIS — G4726 Circadian rhythm sleep disorder, shift work type: Secondary | ICD-10-CM

## 2021-02-01 NOTE — Telephone Encounter (Signed)
Please fill 03/07/21 next

## 2021-02-25 ENCOUNTER — Other Ambulatory Visit: Payer: Self-pay | Admitting: Physician Assistant

## 2021-02-25 DIAGNOSIS — K219 Gastro-esophageal reflux disease without esophagitis: Secondary | ICD-10-CM

## 2021-03-05 ENCOUNTER — Encounter: Payer: Self-pay | Admitting: Physician Assistant

## 2021-03-05 ENCOUNTER — Telehealth: Payer: Self-pay

## 2021-03-05 ENCOUNTER — Other Ambulatory Visit: Payer: Self-pay

## 2021-03-05 MED ORDER — AMOXICILLIN-POT CLAVULANATE 875-125 MG PO TABS
1.0000 | ORAL_TABLET | Freq: Two times a day (BID) | ORAL | 0 refills | Status: DC
Start: 2021-03-05 — End: 2021-04-08

## 2021-03-05 NOTE — Telephone Encounter (Signed)
Faxed a list of inhalers pt has tried I the past before using TRELEGY 200 mgc.  Attn: [facility] Fax #: [phone]            [phone]  ** 

## 2021-03-05 NOTE — Telephone Encounter (Signed)
Call in augmentin 875 mg po bid x 5 days

## 2021-03-07 ENCOUNTER — Encounter: Payer: Self-pay | Admitting: Physician Assistant

## 2021-03-07 ENCOUNTER — Ambulatory Visit (INDEPENDENT_AMBULATORY_CARE_PROVIDER_SITE_OTHER): Payer: Commercial Managed Care - PPO | Admitting: Physician Assistant

## 2021-03-07 ENCOUNTER — Other Ambulatory Visit: Payer: Self-pay

## 2021-03-07 VITALS — BP 114/78 | HR 83 | Temp 98.3°F | Resp 16 | Ht 63.0 in | Wt 173.0 lb

## 2021-03-07 DIAGNOSIS — B379 Candidiasis, unspecified: Secondary | ICD-10-CM

## 2021-03-07 DIAGNOSIS — F411 Generalized anxiety disorder: Secondary | ICD-10-CM

## 2021-03-07 DIAGNOSIS — F339 Major depressive disorder, recurrent, unspecified: Secondary | ICD-10-CM | POA: Diagnosis not present

## 2021-03-07 DIAGNOSIS — K047 Periapical abscess without sinus: Secondary | ICD-10-CM | POA: Diagnosis not present

## 2021-03-07 MED ORDER — FLUCONAZOLE 150 MG PO TABS: 150.0000 mg | ORAL_TABLET | Freq: Once | ORAL | 0 refills | Status: AC

## 2021-03-07 MED ORDER — SERTRALINE HCL 50 MG PO TABS: 50.0000 mg | ORAL_TABLET | Freq: Every day | ORAL | 3 refills | Status: DC

## 2021-03-07 NOTE — Progress Notes (Signed)
[facility] [address] [place], [place] [address]  Internal MEDICINE  Office Visit Note  Patient Name: Savannah [name]  [id number]  [id number]  Date of Service: 03/10/2021  Chief Complaint  Patient presents with   Follow-up   Depression   Anxiety    HPI Pt is here for routine follow up -Tooth is doing a little better since starting ABX, does see dentist and has previously looked into root canal but cant afford crown. -Had an anxiety attack at work the other day and unsure what triggered it. Did some exercises to try to help but did not stop it. Eventually had to step out and cry outside. Overall thinks anxiety might be a little better since starting zoloft, but then still had that panic attack. Will try increasing to 50mg . -Yesterday went to bathroom saw blood in toilet and on tissue. It then stopped right after that. Does have a hx of GI problems. Denies any blood actually in the stool. Discussed it may be a small tear or hemorrhoid that bled briefly. Will call if any recurrence as she may need to see GI. Will continue stool softener -Also has some white discharge and thinks she may have a yeast infection likely from ABX  Current Medication: Outpatient Encounter Medications as of 03/07/2021  Medication Sig   amoxicillin-clavulanate (AUGMENTIN) 875-125 MG tablet Take 1 tablet by mouth 2 (two) times daily.   Crisaborole (EUCRISA) 2 % OINT Apply 1 application topically 2 (two) times daily.   [EXPIRED] fluconazole (DIFLUCAN) 150 MG tablet Take 1 tablet (150 mg total) by mouth once for 1 dose.   levonorgestrel (MIRENA) 20 MCG/24HR IUD 1 each once by Intrauterine route.   modafinil (PROVIGIL) 200 MG tablet TAKE 1 TABLET BY MOUTH EVERY DAY   omeprazole (PRILOSEC) 40 MG capsule TAKE 1 CAPSULE (40 MG TOTAL) BY MOUTH DAILY.   sertraline (ZOLOFT) 50 MG tablet Take 1 tablet (50 mg total) by mouth daily.   Vitamin D, Ergocalciferol, (DRISDOL) 1.25 MG (50000 UNIT) CAPS capsule  Take 50,000 Units by mouth once a week.   [DISCONTINUED] sertraline (ZOLOFT) 25 MG tablet Take 1 tablet (25 mg total) by mouth daily.   No facility-administered encounter medications on file as of 03/07/2021.    Surgical History: Past Surgical History:  Procedure Laterality Date   CARPAL TUNNEL RELEASE Right 09/29/2014   Procedure: CARPAL TUNNEL RELEASE;  Surgeon: 10/01/2014, MD;  Location: [facility] ORS;  Service: Orthopedics;  Laterality: Right;   CESAREAN SECTION     IUD placement     MYRINGOTOMY WITH TUBE PLACEMENT      Medical History: Past Medical History:  Diagnosis Date   Anxiety    Depression    Eardrum rupture, left    Headache     Family History: Family History  Problem Relation Age of Onset   Bone cancer Maternal Grandfather    Healthy Mother     Social History   Socioeconomic History   Marital status: Married    Spouse name: Not on file   Number of children: Not on file   Years of education: Not on file   Highest education level: Not on file  Occupational History   Not on file  Tobacco Use   Smoking status: Former    Packs/day: 0.25    Types: Cigarettes    Quit date: 06/14/2018    Years since quitting: 2.7   Smokeless tobacco: Never  Vaping Use   Vaping Use: Never used  Substance and  Sexual Activity   Alcohol use: Not Currently   Drug use: No   Sexual activity: Yes    Birth control/protection: I.U.D.  Other Topics Concern   Not on file  Social History Narrative   Not on file   Social Determinants of Health   Financial Resource Strain: Not on file  Food Insecurity: Not on file  Transportation Needs: Not on file  Physical Activity: Not on file  Stress: Not on file  Social Connections: Not on file  Intimate Partner Violence: Not on file      Review of Systems  Constitutional:  Positive for fatigue. Negative for chills and unexpected weight change.  HENT:  Negative for congestion, postnasal drip, rhinorrhea, sneezing and sore throat.    Eyes:  Negative for redness.  Respiratory:  Negative for cough, chest tightness and shortness of breath.   Cardiovascular:  Negative for chest pain and palpitations.  Gastrointestinal:  Positive for anal bleeding. Negative for abdominal pain, blood in stool and constipation.  Genitourinary:  Positive for vaginal discharge. Negative for dysuria and frequency.  Musculoskeletal:  Negative for arthralgias, back pain, joint swelling and neck pain.  Skin:  Negative for rash.  Neurological: Negative.  Negative for tremors and numbness.  Hematological:  Negative for adenopathy. Does not bruise/bleed easily.  Psychiatric/Behavioral:  Positive for behavioral problems (Depression) and sleep disturbance. Negative for suicidal ideas. The patient is nervous/anxious.    Vital Signs: BP 114/78   Pulse 83   Temp 98.3 F (36.8 C)   Resp 16   Ht 5\' 3"  (1.6 m)   Wt 173 lb (78.5 kg)   SpO2 97%   BMI 30.65 kg/m    Physical Exam Vitals and nursing note reviewed.  Constitutional:      General: She is not in acute distress.    Appearance: She is well-developed. She is obese. She is not diaphoretic.  HENT:     Head: Normocephalic and atraumatic.     Right Ear: External ear normal.     Left Ear: External ear normal.     Nose: Nose normal.     Mouth/Throat:     Pharynx: No oropharyngeal exudate.  Eyes:     General: No scleral icterus.       Right eye: No discharge.        Left eye: No discharge.     Conjunctiva/sclera: Conjunctivae normal.     Pupils: Pupils are equal, round, and reactive to light.  Neck:     Thyroid: No thyromegaly.     Vascular: No JVD.     Trachea: No tracheal deviation.  Cardiovascular:     Rate and Rhythm: Normal rate and regular rhythm.     Heart sounds: Normal heart sounds. No murmur heard.   No friction rub. No gallop.  Pulmonary:     Effort: Pulmonary effort is normal. No respiratory distress.     Breath sounds: Normal breath sounds. No stridor. No wheezing or  rales.  Chest:     Chest wall: No tenderness.  Breasts:    Right: Normal. No mass.     Left: Normal. No mass.  Abdominal:     General: Bowel sounds are normal. There is no distension.     Palpations: Abdomen is soft. There is no mass.     Tenderness: There is no abdominal tenderness. There is no guarding or rebound.  Musculoskeletal:        General: No tenderness or deformity. Normal range of motion.  Cervical back: Normal range of motion and neck supple.  Lymphadenopathy:     Cervical: No cervical adenopathy.  Skin:    General: Skin is warm and dry.     Coloration: Skin is not pale.     Findings: No erythema or rash.  Neurological:     Mental Status: She is alert.     Cranial Nerves: No cranial nerve deficit.     Motor: No abnormal muscle tone.     Coordination: Coordination normal.     Deep Tendon Reflexes: Reflexes are normal and symmetric.  Psychiatric:        Behavior: Behavior normal.        Thought Content: Thought content normal.        Judgment: Judgment normal.       Assessment/Plan: 1. GAD (generalized anxiety disorder) Will increase to 50mg  daily - sertraline (ZOLOFT) 50 MG tablet; Take 1 tablet (50 mg total) by mouth daily.  Dispense: 30 tablet; Refill: 3  2. Depression, recurrent (HCC) Will increase zoloft to 50mg  daily  3. Yeast infection May take diflucan  - fluconazole (DIFLUCAN) 150 MG tablet; Take 1 tablet (150 mg total) by mouth once for 1 dose.  Dispense: 1 tablet; Refill: 0  4. Tooth infection Advised to complete ABX and follow up with dentist   General Counseling: understanding of the findings of todays visit and agrees with plan of treatment. I have discussed any further diagnostic evaluation that may be needed or ordered today. We also reviewed her medications today. she has been encouraged to call the office with any questions or concerns that should arise related to todays visit.    No orders of the defined types  were placed in this encounter.   Meds ordered this encounter  Medications   sertraline (ZOLOFT) 50 MG tablet    Sig: Take 1 tablet (50 mg total) by mouth daily.    Dispense:  30 tablet    Refill:  3   fluconazole (DIFLUCAN) 150 MG tablet    Sig: Take 1 tablet (150 mg total) by mouth once for 1 dose.    Dispense:  1 tablet    Refill:  0    This patient was seen by , PA-C in collaboration with Dr. Kathie Dike as a part of collaborative care agreement.   Total time spent:35 Minutes Time spent includes review of chart, medications, test results, and follow up plan with the patient.      Dr Lynn Ito Internal medicine

## 2021-03-26 ENCOUNTER — Encounter: Payer: Self-pay | Admitting: Physician Assistant

## 2021-04-05 ENCOUNTER — Ambulatory Visit: Payer: Commercial Managed Care - PPO | Admitting: Physician Assistant

## 2021-04-08 ENCOUNTER — Other Ambulatory Visit: Payer: Self-pay

## 2021-04-08 ENCOUNTER — Ambulatory Visit: Payer: Commercial Managed Care - PPO | Admitting: Physician Assistant

## 2021-04-08 ENCOUNTER — Encounter: Payer: Self-pay | Admitting: Physician Assistant

## 2021-04-08 DIAGNOSIS — G4726 Circadian rhythm sleep disorder, shift work type: Secondary | ICD-10-CM

## 2021-04-08 DIAGNOSIS — F411 Generalized anxiety disorder: Secondary | ICD-10-CM | POA: Diagnosis not present

## 2021-04-08 DIAGNOSIS — F339 Major depressive disorder, recurrent, unspecified: Secondary | ICD-10-CM

## 2021-04-08 MED ORDER — MODAFINIL 200 MG PO TABS: 200.0000 mg | ORAL_TABLET | Freq: Every day | ORAL | 2 refills | Status: DC

## 2021-04-08 NOTE — Progress Notes (Signed)
[facility] [place], [place] [address]  Pulmonary Sleep Medicine   Office Visit Note  Patient Name: Savannah [name] DOB: 1985/09/28 MRN [id number]  Date of Service: 04/10/2021  Complaints/HPI: Pt is here for routine follow up for hypersomnia. Modafanil helps well for the first 2 hours. Sleep schedule is altered. For instance she was off work for the holiday and reports she dozed off and on this past Thursday and Friday stayed awake for the most part but then dozed and stayed awake 10pm Friday-10pm Saturday and then crashed until 2-3Am and stayed up until the afternoon. Discussed sticking with set routine. Does not drink much caffeine at work unless really necessary. Anxiety has been worse though and may contribute to fatigue.  ROS  General: (-) fever, (-) chills, (-) night sweats, (-) weakness Skin: (-) rashes, (-) itching,. Eyes: (-) visual changes, (-) redness, (-) itching. Nose and Sinuses: (-) nasal stuffiness or itchiness, (-) postnasal drip, (-) nosebleeds, (-) sinus trouble. Mouth and Throat: (-) sore throat, (-) hoarseness. Neck: (-) swollen glands, (-) enlarged thyroid, (-) neck pain. Respiratory: - cough, (-) bloody sputum, - shortness of breath, - wheezing. Cardiovascular: - ankle swelling, (-) chest pain. Lymphatic: (-) lymph node enlargement. Neurologic: (-) numbness, (-) tingling. Psychiatric: (-) anxiety, (-) depression   Current Medication: Outpatient Encounter Medications as of 04/08/2021  Medication Sig   Crisaborole (EUCRISA) 2 % OINT Apply 1 application topically 2 (two) times daily.   levonorgestrel (MIRENA) 20 MCG/24HR IUD 1 each once by Intrauterine route.   omeprazole (PRILOSEC) 40 MG capsule TAKE 1 CAPSULE (40 MG TOTAL) BY MOUTH DAILY.   sertraline (ZOLOFT) 50 MG tablet Take 1 tablet (50 mg total) by mouth daily.   Vitamin D, Ergocalciferol, (DRISDOL) 1.25 MG (50000 UNIT) CAPS capsule Take 50,000 Units by mouth once a week.    [DISCONTINUED] amoxicillin-clavulanate (AUGMENTIN) 875-125 MG tablet Take 1 tablet by mouth 2 (two) times daily.   [DISCONTINUED] modafinil (PROVIGIL) 200 MG tablet TAKE 1 TABLET BY MOUTH EVERY DAY   modafinil (PROVIGIL) 200 MG tablet Take 1 tablet (200 mg total) by mouth daily.   No facility-administered encounter medications on file as of 04/08/2021.    Surgical History: Past Surgical History:  Procedure Laterality Date   CARPAL TUNNEL RELEASE Right 09/29/2014   Procedure: CARPAL TUNNEL RELEASE;  Surgeon: Christophe Louis, MD;  Location: [facility] ORS;  Service: Orthopedics;  Laterality: Right;   CESAREAN SECTION     IUD placement     MYRINGOTOMY WITH TUBE PLACEMENT      Medical History: Past Medical History:  Diagnosis Date   Anxiety    Depression    Eardrum rupture, left    Headache     Family History: Family History  Problem Relation Age of Onset   Bone cancer Maternal Grandfather    Healthy Mother     Social History: Social History   Socioeconomic History   Marital status: Married    Spouse name: Not on file   Number of children: Not on file   Years of education: Not on file   Highest education level: Not on file  Occupational History   Not on file  Tobacco Use   Smoking status: Former    Packs/day: 0.25    Types: Cigarettes    Quit date: 06/14/2018    Years since quitting: 2.8   Smokeless tobacco: Never  Vaping Use   Vaping Use: Never used  Substance and Sexual Activity   Alcohol use: Not  Currently   Drug use: No   Sexual activity: Yes    Birth control/protection: I.U.D.  Other Topics Concern   Not on file  Social History Narrative   Not on file   Social Determinants of Health   Financial Resource Strain: Not on file  Food Insecurity: Not on file  Transportation Needs: Not on file  Physical Activity: Not on file  Stress: Not on file  Social Connections: Not on file  Intimate Partner Violence: Not on file    Vital Signs: Blood pressure 117/79,  pulse 81, temperature 98 F (36.7 C), resp. rate 16, height _0  (1.6 m), weight 174 lb (78.9 kg), SpO2 97 %.  Examination: General Appearance: The patient is well-developed, well-nourished, and in no distress. Skin: Gross inspection of skin unremarkable. Head: normocephalic, no gross deformities. Eyes: no gross deformities noted. ENT: ears appear grossly normal no exudates. Neck: Supple. No thyromegaly. No LAD. Respiratory: Lungs clear to auscultation bilaterally. Cardiovascular: Normal S1 and S2 without murmur or rub. Extremities: No cyanosis. pulses are equal. Neurologic: Alert and oriented. No involuntary movements.  LABS: Recent Results (from the past 2160 hour(s))  UA/M w/rflx Culture, Routine     Status: None   Collection Time: 01/24/21  3:28 PM   Specimen: Urine   Urine  Result Value Ref Range   Specific Gravity, UA 1.022 1.005 - 1.030   pH, UA 6.0 5.0 - 7.5   Color, UA Yellow Yellow   Appearance Ur Clear Clear   Leukocytes,UA Negative Negative   Protein,UA Negative Negative/Trace   Glucose, UA Negative Negative   Ketones, UA Negative Negative   RBC, UA Negative Negative   Bilirubin, UA Negative Negative   Urobilinogen, Ur 0.2 0.2 - 1.0 mg/dL   Nitrite, UA Negative Negative   Microscopic Examination Comment     Comment: Microscopic follows if indicated.   Microscopic Examination See below:     Comment: Microscopic was indicated and was performed.   Urinalysis Reflex Comment     Comment: This specimen will not reflex to a Urine Culture.  Microscopic Examination     Status: None   Collection Time: 01/24/21  3:28 PM   Urine  Result Value Ref Range   WBC, UA 0-5 0 - 5 /hpf   RBC 0-2 0 - 2 /hpf   Epithelial Cells (non renal) 0-10 0 - 10 /hpf   Casts None seen None seen /lpf   Bacteria, UA None seen None seen/Few  CBC w/Diff/Platelet     Status: Abnormal   Collection Time: 01/25/21 11:12 AM  Result Value Ref Range   WBC 8.5 3.4 - 10.8 x10E3/uL   RBC 4.82 3.77 -  5.28 x10E6/uL   Hemoglobin 14.6 11.1 - 15.9 g/dL   Hematocrit 43.1 34.0 - 46.6 %   MCV 89 79 - 97 fL   MCH 30.3 26.6 - 33.0 pg   MCHC 33.9 31.5 - 35.7 g/dL   RDW 12.4 11.7 - 15.4 %   Platelets 345 150 - 450 x10E3/uL   Neutrophils 50 Not Estab. %   Lymphs 40 Not Estab. %   Monocytes 7 Not Estab. %   Eos 2 Not Estab. %   Basos 1 Not Estab. %   Neutrophils Absolute 4.3 1.4 - 7.0 x10E3/uL   Lymphocytes Absolute 3.4 (H) 0.7 - 3.1 x10E3/uL   Monocytes Absolute 0.6 0.1 - 0.9 x10E3/uL   EOS (ABSOLUTE) 0.2 0.0 - 0.4 x10E3/uL   Basophils Absolute 0.1 0.0 - 0.2 x10E3/uL  Immature Granulocytes 0 Not Estab. %   Immature Grans (Abs) 0.0 0.0 - 0.1 x10E3/uL  Comprehensive metabolic panel     Status: None   Collection Time: 01/25/21 11:12 AM  Result Value Ref Range   Glucose 79 65 - 99 mg/dL   BUN 19 6 - 20 mg/dL   Creatinine, Ser 0.81 0.57 - 1.00 mg/dL   eGFR 98 >59 mL/min/1.73   BUN/Creatinine Ratio 23 9 - 23   Sodium 140 134 - 144 mmol/L   Potassium 4.0 3.5 - 5.2 mmol/L   Chloride 102 96 - 106 mmol/L   CO2 21 20 - 29 mmol/L   Calcium 9.5 8.7 - 10.2 mg/dL   Total Protein 7.3 6.0 - 8.5 g/dL   Albumin 4.5 3.8 - 4.8 g/dL   Globulin, Total 2.8 1.5 - 4.5 g/dL   Albumin/Globulin Ratio 1.6 1.2 - 2.2   Bilirubin Total 0.3 0.0 - 1.2 mg/dL   Alkaline Phosphatase 77 44 - 121 IU/L   AST 18 0 - 40 IU/L   ALT 18 0 - 32 IU/L  TSH + free T4     Status: None   Collection Time: 01/25/21 11:12 AM  Result Value Ref Range   TSH 2.120 0.450 - 4.500 uIU/mL   Free T4 1.07 0.82 - 1.77 ng/dL  Lipid Panel With LDL/HDL Ratio     Status: Abnormal   Collection Time: 01/25/21 11:12 AM  Result Value Ref Range   Cholesterol, Total 209 (H) 100 - 199 mg/dL   Triglycerides 149 0 - 149 mg/dL   HDL 37 (L) >39 mg/dL   VLDL Cholesterol Cal 27 5 - 40 mg/dL   LDL Chol Calc (NIH) 145 (H) 0 - 99 mg/dL   LDL/HDL Ratio 3.9 (H) 0.0 - 3.2 ratio    Comment:                                     LDL/HDL Ratio                                              Men  Women                               1/2 Avg.Risk  1.0    1.5                                   Avg.Risk  3.6    3.2                                2X Avg.Risk  6.2    5.0                                3X Avg.Risk  8.0    6.1   VITAMIN D 25 Hydroxy (Vit-D Deficiency, Fractures)     Status: None   Collection Time: 01/25/21 11:12 AM  Result Value Ref Range   Vit D, 25-Hydroxy 54.5 30.0 - 100.0 ng/mL    Comment: Vitamin D deficiency has been defined  by the [place] practice guideline as a level of serum 25-OH vitamin D less than 20 ng/mL (1,2). The Endocrine Society went on to further define vitamin D insufficiency as a level between 21 and 29 ng/mL (2). 1. IOM (Institute of Medicine). 2010. Dietary reference    intakes for calcium and D. [place]: The    [employer]. 2. Holick MF, Binkley [place], Bischoff-Ferrari HA, et al.    Evaluation, treatment, and prevention of vitamin D    deficiency: an Endocrine Society clinical practice    guideline. JCEM. 2011 Jul; 96(7):1911-30.     Radiology: No results found.  No results found.  No results found.    Assessment and Plan: Patient Active Problem List   Diagnosis Date Noted   Encounter for general adult medical examination with abnormal findings 01/17/2020   Dysuria 01/17/2020   Constipation 10/18/2019   Chronic right shoulder pain 10/18/2019   Leg cramps 08/24/2019   Upper back pain on left side 06/21/2019   Need for prophylactic vaccination with combined diphtheria-tetanus-pertussis (DTaP) vaccine 03/18/2019   Circadian rhythm sleep disorder, 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   Generalized abdominal tenderness without rebound tenderness 02/03/2019   Chronic low back pain 02/03/2019   Excessive daytime sleepiness 08/29/2018   Fatigue 08/29/2018   Atopic dermatitis 08/17/2018   Gastroesophageal reflux disease without esophagitis 08/17/2018   Vitamin  D deficiency 08/17/2018   Carpal tunnel syndrome 14/78/2956   Bacterial folliculitis 21/30/8657   Irritable bowel syndrome with both constipation and diarrhea 06/01/2018   Genital warts 03/26/2017   Depression 01/11/2015   Psoriasis 01/11/2015   Ankle pain 01/11/2015   1. Circadian rhythm sleep disorder, shift work type May continue modafanil, also discussed regulating sleep schedule to keep consistency even on days off/weekends - modafinil (PROVIGIL) 200 MG tablet; Take 1 tablet (200 mg total) by mouth daily.  Dispense: 30 tablet; Refill: 2  2. GAD (generalized anxiety disorder) S/E to zoloft prescribed via PCP, will move forward with psych referral given history and S/E with multiple meds and since this can be playing a role in fatigue - Ambulatory referral to Psychiatry  3. Depression, recurrent ([place]) S/E to zoloft prescribed via PCP, will move forward with psych referral given history and S/E with multiple meds and since this can be playing a role in fatigue - Ambulatory referral to Psychiatry   General Counseling: I have discussed the findings of the evaluation and examination with Caryl Pina.  I have also discussed any further diagnostic evaluation thatmay be needed or ordered today. Ambree verbalizes understanding of the findings of todays visit. We also reviewed her medications today and discussed drug interactions and side effects including but not limited excessive drowsiness and altered mental states. We also discussed that there is always a risk not just to her but also people around her. she has been encouraged to call the office with any questions or concerns that should arise related to todays visit.  Orders Placed This Encounter  Procedures   Ambulatory referral to Psychiatry    Referral Priority:   Routine    Referral Type:   Psychiatric    Referral Reason:   Specialty Services Required    Requested Specialty:   Psychiatry    Number of Visits Requested:   1     Time  spent: 30  I have personally obtained a history, examined the patient, evaluated laboratory and imaging results, formulated the assessment and plan and placed orders. This  patient was seen by Drema Dallas, PA-C in collaboration with Dr. Devona Konig as a part of collaborative care agreement.     Allyne Gee, MD [facility] Pulmonary and Critical Care Sleep medicine

## 2021-04-24 ENCOUNTER — Encounter: Payer: Self-pay | Admitting: Physician Assistant

## 2021-04-28 ENCOUNTER — Telehealth: Payer: Self-pay

## 2021-04-28 NOTE — Telephone Encounter (Signed)
PA approved for MODAFINIL 200 mg on 04/28/21 at 440pm.valid til 04/28/22

## 2021-06-04 ENCOUNTER — Other Ambulatory Visit: Payer: Self-pay

## 2021-06-04 ENCOUNTER — Ambulatory Visit (INDEPENDENT_AMBULATORY_CARE_PROVIDER_SITE_OTHER): Payer: Commercial Managed Care - PPO | Admitting: Psychiatry

## 2021-06-04 ENCOUNTER — Encounter: Payer: Self-pay | Admitting: Psychiatry

## 2021-06-04 VITALS — BP 133/87 | HR 97 | Temp 98.5°F | Wt 182.8 lb

## 2021-06-04 DIAGNOSIS — F4010 Social phobia, unspecified: Secondary | ICD-10-CM

## 2021-06-04 DIAGNOSIS — G4700 Insomnia, unspecified: Secondary | ICD-10-CM | POA: Diagnosis not present

## 2021-06-04 DIAGNOSIS — F439 Reaction to severe stress, unspecified: Secondary | ICD-10-CM | POA: Diagnosis not present

## 2021-06-04 DIAGNOSIS — F411 Generalized anxiety disorder: Secondary | ICD-10-CM

## 2021-06-04 DIAGNOSIS — F17200 Nicotine dependence, unspecified, uncomplicated: Secondary | ICD-10-CM

## 2021-06-04 MED ORDER — DULOXETINE HCL 20 MG PO CPEP: 20.0000 mg | ORAL_CAPSULE | Freq: Every day | ORAL | 0 refills | Status: DC
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
## 2021-06-04 NOTE — Patient Instructions (Signed)
Duloxetine Delayed-Release Capsules What is this medication? DULOXETINE (doo LOX e teen) treats depression, anxiety, fibromyalgia, and certain types of chronic pain such as nerve, bone, or joint pain. It increases the amount of serotonin and norepinephrine in the brain, hormones that help regulate mood and pain. It belongs to a group of medications called SNRIs. This medicine may be used for other purposes; ask your health care provider or pharmacist if you have questions. COMMON BRAND NAME(S): Cymbalta, Drizalma, Irenka What should I tell my care team before I take this medication? They need to know if you have any of these conditions: Bipolar disorder Glaucoma High blood pressure Kidney disease Liver disease Seizures Suicidal thoughts, plans or attempt; a previous suicide attempt by you or a family member Take medications that treat or prevent blood clots Taken medications called MAOIs like Carbex, Eldepryl, Marplan, Nardil, and Parnate within 14 days Trouble passing urine An unusual reaction to duloxetine, other medications, foods, dyes, or preservatives Pregnant or trying to get pregnant Breast-feeding How should I use this medication? Take this medication by mouth with a glass of water. Follow the directions on the prescription label. Do not crush, cut or chew some capsules of this medication. Some capsules may be opened and sprinkled on applesauce. Check with your care team or pharmacist if you are not sure. You can take this medication with or without food. Take your medication at regular intervals. Do not take your medication more often than directed. Do not stop taking this medication suddenly except upon the advice of your care team. Stopping this medication too quickly may cause serious side effects or your condition may worsen. A special MedGuide will be given to you by the pharmacist with each prescription and refill. Be sure to read this information carefully each time. Talk to  your care team regarding the use of this medication in children. While this medication may be prescribed for children as young as 7 years of age for selected conditions, precautions do apply. Overdosage: If you think you have taken too much of this medicine contact a poison control center or emergency room at once. NOTE: This medicine is only for you. Do not share this medicine with others. What if I miss a dose? If you miss a dose, take it as soon as you can. If it is almost time for your next dose, take only that dose. Do not take double or extra doses. What may interact with this medication? Do not take this medication with any of the following: Desvenlafaxine Levomilnacipran Linezolid MAOIs like Carbex, Eldepryl, Emsam, Marplan, Nardil, and Parnate Methylene blue (injected into a vein) Milnacipran Safinamide Thioridazine Venlafaxine Viloxazine This medication may also interact with the following: Alcohol Amphetamines Aspirin and aspirin-like medications Certain antibiotics like ciprofloxacin and enoxacin Certain medications for blood pressure, heart disease, irregular heart beat Certain medications for depression, anxiety, or psychotic disturbances Certain medications for migraine headache like almotriptan, eletriptan, frovatriptan, naratriptan, rizatriptan, sumatriptan, zolmitriptan Certain medications that treat or prevent blood clots like warfarin, enoxaparin, and dalteparin Cimetidine Fentanyl Lithium NSAIDS, medications for pain and inflammation, like ibuprofen or naproxen Phentermine Procarbazine Rasagiline Sibutramine St. John's wort Theophylline Tramadol Tryptophan This list may not describe all possible interactions. Give your health care provider a list of all the medicines, herbs, non-prescription drugs, or dietary supplements you use. Also tell them if you smoke, drink alcohol, or use illegal drugs. Some items may interact with your medicine. What should I watch  for while using this medication? Tell your   care team if your symptoms do not get better or if they get worse. Visit your care team for regular checks on your progress. Because it may take several weeks to see the full effects of this medication, it is important to continue your treatment as prescribed by your care team. This medication may cause serious skin reactions. They can happen weeks to months after starting the medication. Contact your care team right away if you notice fevers or flu-like symptoms with a rash. The rash may be red or purple and then turn into blisters or peeling of the skin. Or, you might notice a red rash with swelling of the face, lips, or lymph nodes in your neck or under your arms. Watch for new or worsening thoughts of suicide or depression. This includes sudden changes in mood, behaviors, or thoughts. These changes can happen at any time but are more common in the beginning of treatment or after a change in dose. Call your care team right away if you experience these thoughts or worsening depression. Manic episodes may happen in patients with bipolar disorder who take this medication. Watch for changes in feelings or behaviors such as feeling anxious, nervous, agitated, panicky, irritable, hostile, aggressive, impulsive, severely restless, overly excited and hyperactive, or trouble sleeping. These symptoms can happen at any time, but are more common in the beginning of treatment or after a change in dose. Call your care team right away if you notice any of these symptoms. You may get drowsy or dizzy. Do not drive, use machinery, or do anything that needs mental alertness until you know how this medication affects you. Do not stand or sit up quickly, especially if you are an older patient. This reduces the risk of dizzy or fainting spells. Alcohol may interfere with the effect of this medication. Avoid alcoholic drinks. This medication may increase blood sugar. The risk may be  higher in patients who already have diabetes. Ask your care team what you can do to lower your risk of diabetes while taking this medication. This medication can cause an increase in blood pressure. This medication can also cause a sudden drop in your blood pressure, which may make you feel faint and increase the chance of a fall. These effects are most common when you first start the medication or when the dose is increased, or during use of other medications that can cause a sudden drop in blood pressure. Check with your care team for instructions on monitoring your blood pressure while taking this medication. Your mouth may get dry. Chewing sugarless gum or sucking hard candy, and drinking plenty of water, may help. Contact your care team if the problem does not go away or is severe. What side effects may I notice from receiving this medication? Side effects that you should report to your care team as soon as possible: Allergic reactions--skin rash, itching, hives, swelling of the face, lips, tongue, or throat Bleeding--bloody or black, tar-like stools, red or dark brown urine, vomiting blood or brown material that looks like coffee grounds, small, red or purple spots on skin, unusual bleeding or bruising Increase in blood pressure Liver injury--right upper belly pain, loss of appetite, nausea, light-colored stool, dark yellow or brown urine, yellowing skin or eyes, unusual weakness or fatigue Low sodium level--muscle weakness, fatigue, dizziness, headache, confusion Redness, blistering, peeling, or loosening of the skin, including inside the mouth Serotonin syndrome--irritability, confusion, fast or irregular heartbeat, muscle stiffness, twitching muscles, sweating, high fever, seizures, chills, vomiting, diarrhea   Sudden eye pain or change in vision such as blurry vision, seeing halos around lights, vision loss Thoughts of suicide or self-harm, worsening mood, feelings of depression Trouble passing  urine Side effects that usually do not require medical attention (report to your care team if they continue or are bothersome): Change in sex drive or performance Constipation Diarrhea Dizziness Dry mouth Excessive sweating Loss of appetite Nausea Vomiting This list may not describe all possible side effects. Call your doctor for medical advice about side effects. You may report side effects to FDA at 1-800-FDA-1088. Where should I keep my medication? Keep out of the reach of children and pets. Store at room temperature between 15 and 30 degrees C (59 to 86 degrees F). Get rid of any unused medication after the expiration date. To get rid of medications that are no longer needed or have expired: Take the medication to a medication take-back program. Check with your pharmacy or law enforcement to find a location. If you cannot return the medication, check the label or package insert to see if the medication should be thrown out in the garbage or flushed down the toilet. If you are not sure, ask your care team. If it is safe to put it in the trash, take the medication out of the container. Mix the medication with cat litter, dirt, coffee grounds, or other unwanted substance. Seal the mixture in a bag or container. Put it in the trash. NOTE: This sheet is a summary. It may not cover all possible information. If you have questions about this medicine, talk to your doctor, pharmacist, or health care provider.  2022 [employer]vier/Gold Standard (2020-05-08 00:00:00)  

## 2021-06-05 ENCOUNTER — Encounter: Payer: Self-pay | Admitting: Psychiatry

## 2021-06-05 DIAGNOSIS — F17200 Nicotine dependence, unspecified, uncomplicated: Secondary | ICD-10-CM | POA: Insufficient documentation

## 2021-06-05 DIAGNOSIS — F411 Generalized anxiety disorder: Secondary | ICD-10-CM | POA: Insufficient documentation

## 2021-06-05 DIAGNOSIS — G4700 Insomnia, unspecified: Secondary | ICD-10-CM | POA: Insufficient documentation

## 2021-06-05 DIAGNOSIS — F439 Reaction to severe stress, unspecified: Secondary | ICD-10-CM | POA: Insufficient documentation

## 2021-06-05 DIAGNOSIS — F4010 Social phobia, unspecified: Secondary | ICD-10-CM | POA: Insufficient documentation

## 2021-06-06 ENCOUNTER — Encounter: Payer: Self-pay | Admitting: Physician Assistant

## 2021-06-06 ENCOUNTER — Ambulatory Visit (INDEPENDENT_AMBULATORY_CARE_PROVIDER_SITE_OTHER): Payer: Commercial Managed Care - PPO | Admitting: Physician Assistant

## 2021-06-06 ENCOUNTER — Other Ambulatory Visit: Payer: Self-pay

## 2021-06-06 VITALS — BP 126/79 | HR 84 | Temp 98.4°F | Resp 16 | Ht 63.0 in | Wt 179.6 lb

## 2021-06-06 DIAGNOSIS — G4726 Circadian rhythm sleep disorder, shift work type: Secondary | ICD-10-CM | POA: Diagnosis not present

## 2021-06-06 DIAGNOSIS — F339 Major depressive disorder, recurrent, unspecified: Secondary | ICD-10-CM

## 2021-06-06 DIAGNOSIS — F411 Generalized anxiety disorder: Secondary | ICD-10-CM

## 2021-06-06 NOTE — Progress Notes (Signed)
[facility] [address] [place], [place] [address]  Internal MEDICINE  Office Visit Note  Patient Name: Savannah [name]  [id number]  [id number]  Date of Service: 06/11/2021  Chief Complaint  Patient presents with   Follow-up   Depression   Anxiety    HPI Pt is here for routine follow up -Saw psych and was started on Duloxetine. Goes back in 2 weeks for follow up -sees counselor in March, she is worried about this and does admit that she did not want to see psych as she does not like opening up to new people, however understands the importance of following up and giving counseling a try as well especially given prior med side effects and the fact that she thinks a lod of her anxiety is rooted in her things from her childhood that she has never talked about -States she is trying to focus on sleep, but does not want to take anything for sleep because it makes her so groggy and wants to be able to get up and take care of kids if needed -Psych wants her to take a test to determine which meds she can try and is looking in to coverage for this -bruise on left breast from engine recoil at work, states it is not bothersome  Current Medication: Outpatient Encounter Medications as of 06/06/2021  Medication Sig   DULoxetine (CYMBALTA) 20 MG capsule Take 1 capsule (20 mg total) by mouth daily.   levonorgestrel (MIRENA) 20 MCG/24HR IUD 1 each once by Intrauterine route.   modafinil (PROVIGIL) 200 MG tablet Take 1 tablet (200 mg total) by mouth daily.   omeprazole (PRILOSEC) 40 MG capsule TAKE 1 CAPSULE (40 MG TOTAL) BY MOUTH DAILY.   No facility-administered encounter medications on file as of 06/06/2021.    Surgical History: Past Surgical History:  Procedure Laterality Date   CARPAL TUNNEL RELEASE Right 09/29/2014   Procedure: CARPAL TUNNEL RELEASE;  Surgeon: Myra Rude, MD;  Location: [facility] ORS;  Service: Orthopedics;  Laterality: Right;   CESAREAN SECTION     IUD  placement     MYRINGOTOMY WITH TUBE PLACEMENT      Medical History: Past Medical History:  Diagnosis Date   Anxiety    Depression    Eardrum rupture, left    Headache     Family History: Family History  Problem Relation Age of Onset   Healthy Mother    Bone cancer Maternal Grandfather    Mental illness Neg Hx     Social History   Socioeconomic History   Marital status: Married    Spouse name: katherine   Number of children: 3   Years of education: Not on file   Highest education level: High school graduate  Occupational History   Not on file  Tobacco Use   Smoking status: Some Days    Packs/day: 0.25    Types: Cigarettes    Last attempt to quit: 06/14/2018    Years since quitting: 2.9   Smokeless tobacco: Never  Vaping Use   Vaping Use: Never used  Substance and Sexual Activity   Alcohol use: Not Currently   Drug use: No   Sexual activity: Yes    Birth control/protection: I.U.D.  Other Topics Concern   Not on file  Social History Narrative   Not on file   Social Determinants of Health   Financial Resource Strain: Not on file  Food Insecurity: Not on file  Transportation Needs: Not on file  Physical Activity:  Not on file  Stress: Not on file  Social Connections: Not on file  Intimate Partner Violence: Not on file      Review of Systems  Constitutional:  Positive for fatigue. Negative for chills and unexpected weight change.  HENT:  Negative for congestion, postnasal drip, rhinorrhea, sneezing and sore throat.   Eyes:  Negative for redness.  Respiratory:  Negative for cough, chest tightness and shortness of breath.   Cardiovascular:  Negative for chest pain and palpitations.  Gastrointestinal:  Negative for abdominal pain, constipation, diarrhea, nausea and vomiting.  Genitourinary:  Negative for dysuria and frequency.  Musculoskeletal:  Negative for arthralgias, back pain, joint swelling and neck pain.  Skin:  Positive for color change. Negative  for rash.       bruising  Neurological: Negative.  Negative for tremors and numbness.  Hematological:  Negative for adenopathy. Does not bruise/bleed easily.  Psychiatric/Behavioral:  Positive for behavioral problems (Depression) and sleep disturbance. Negative for suicidal ideas. The patient is nervous/anxious.    Vital Signs: BP 126/79    Pulse 84    Temp 98.4 F (36.9 C)    Resp 16    Ht 5\' 3"  (1.6 m)    Wt 179 lb 9.6 oz (81.5 kg)    SpO2 97%    BMI 31.81 kg/m    Physical Exam Vitals and nursing note reviewed.  Constitutional:      General: She is not in acute distress.    Appearance: She is well-developed. She is obese. She is not diaphoretic.  HENT:     Head: Normocephalic and atraumatic.     Mouth/Throat:     Pharynx: No oropharyngeal exudate.  Eyes:     Pupils: Pupils are equal, round, and reactive to light.  Neck:     Thyroid: No thyromegaly.     Vascular: No JVD.     Trachea: No tracheal deviation.  Cardiovascular:     Rate and Rhythm: Normal rate and regular rhythm.     Heart sounds: Normal heart sounds. No murmur heard.   No friction rub. No gallop.  Pulmonary:     Effort: Pulmonary effort is normal. No respiratory distress.     Breath sounds: No wheezing or rales.  Chest:     Chest wall: No tenderness.  Abdominal:     General: Bowel sounds are normal.     Palpations: Abdomen is soft.  Musculoskeletal:        General: Normal range of motion.     Cervical back: Normal range of motion and neck supple.  Lymphadenopathy:     Cervical: No cervical adenopathy.  Skin:    General: Skin is warm and dry.     Findings: Bruising present.     Comments: Large bruise on left breast where engine recoil hit her at work--denies pain  Neurological:     Mental Status: She is alert and oriented to person, place, and time.     Cranial Nerves: No cranial nerve deficit.  Psychiatric:        Behavior: Behavior normal.        Thought Content: Thought content normal.         Judgment: Judgment normal.       Assessment/Plan: 1. GAD (generalized anxiety disorder) Followed by psych and advised to keep appts and start counseling as recommended  2. Depression, recurrent (HCC) Followed by psych and advised to keep appts and start counseling as recommended  3. Circadian rhythm sleep disorder, shift work  type Followed by pulmonology, on modafanil. Advised to keep set sleep schedule. May also address underlying sleep difficulty with psych   General Counseling: Kathie DikeAshley verbalizes understanding of the findings of todays visit and agrees with plan of treatment. I have discussed any further diagnostic evaluation that may be needed or ordered today. We also reviewed her medications today. she has been encouraged to call the office with any questions or concerns that should arise related to todays visit.    No orders of the defined types were placed in this encounter.   No orders of the defined types were placed in this encounter.   This patient was seen by Lynn ItoLauren Clayden Withem, PA-C in collaboration with Dr. Beverely RisenFozia Khan as a part of collaborative care agreement.   Total time spent:30 Minutes Time spent includes review of chart, medications, test results, and follow up plan with the patient.      Dr Lyndon CodeFozia M Khan Internal medicine

## 2021-06-20 ENCOUNTER — Ambulatory Visit (INDEPENDENT_AMBULATORY_CARE_PROVIDER_SITE_OTHER): Payer: Commercial Managed Care - PPO | Admitting: Psychiatry

## 2021-06-20 ENCOUNTER — Encounter: Payer: Self-pay | Admitting: Psychiatry

## 2021-06-20 ENCOUNTER — Other Ambulatory Visit: Payer: Self-pay

## 2021-06-20 VITALS — BP 127/88 | HR 99 | Temp 97.5°F | Wt 177.4 lb

## 2021-06-20 DIAGNOSIS — G4700 Insomnia, unspecified: Secondary | ICD-10-CM | POA: Diagnosis not present

## 2021-06-20 DIAGNOSIS — F17200 Nicotine dependence, unspecified, uncomplicated: Secondary | ICD-10-CM

## 2021-06-20 DIAGNOSIS — F4010 Social phobia, unspecified: Secondary | ICD-10-CM

## 2021-06-20 DIAGNOSIS — F439 Reaction to severe stress, unspecified: Secondary | ICD-10-CM

## 2021-06-20 DIAGNOSIS — F411 Generalized anxiety disorder: Secondary | ICD-10-CM

## 2021-06-20 MED ORDER — DULOXETINE HCL 20 MG PO CPEP: 20.0000 mg | ORAL_CAPSULE | Freq: Every day | ORAL | 0 refills | Status: DC

## 2021-06-20 NOTE — Progress Notes (Signed)
BH MD OP Progress Note  06/20/2021 2:55 PM Savannah [name]  MRN:  [id number]  Chief Complaint:  Chief Complaint   Follow-up 36 year old Caucasian female with history of GAD, social anxiety disorder, trauma and stress related disorder, insomnia, tobacco use disorder presenting for medication management after recent initiation of Cymbalta.    HPI: Savannah [name] is a 36 year old Caucasian female, married, lives in [place], employed, has a history of GAD, social anxiety, trauma and stress related disorder, tobacco use disorder, presented for medication management.  Patient reports she has started taking the Cymbalta.  She reports the first few days the Cymbalta made her extremely tired and sleepy.  She reports she had trouble keeping herself awake at work and once she reached home she has had trouble waking up.  She however reports the past 2 days she has gotten used to the Cymbalta and it does not make her sleepy as it did in the past.  She has noticed some good improvement in her mood.  Does not feel as depressed as she used to before.  Anxiety has gotten better.  Patient reports appetite is fair.  Denies any suicidality, homicidality or perceptual disturbances.  Interested in smoking cessation, has set a quit date for the end of the month.  Wife is also working with her and planning to quit together.  Has upcoming appointment with Ms. Christina Hussami.     Visit Diagnosis:    ICD-10-CM   1. GAD (generalized anxiety disorder)  F41.1 DULoxetine (CYMBALTA) 20 MG capsule    2. Social anxiety disorder  F40.10 DULoxetine (CYMBALTA) 20 MG capsule    3. Trauma and stressor-related disorder  F43.9 DULoxetine (CYMBALTA) 20 MG capsule    4. Insomnia, unspecified type  G47.00     5. Tobacco use disorder  F17.200       Past Psychiatric History: Reviewed past psychiatric history from my progress note on 06/04/2021.  Past trials of sertraline-headache, lorazepam-side effect, Klonopin-side  effect, melatonin-did not work.  Past Medical History:  Past Medical History:  Diagnosis Date   Anxiety    Depression    Eardrum rupture, left    Headache     Past Surgical History:  Procedure Laterality Date   CARPAL TUNNEL RELEASE Right 09/29/2014   Procedure: CARPAL TUNNEL RELEASE;  Surgeon: Myra Rude, MD;  Location: [facility] ORS;  Service: Orthopedics;  Laterality: Right;   CESAREAN SECTION     IUD placement     MYRINGOTOMY WITH TUBE PLACEMENT      Family Psychiatric History: Reviewed family psychiatric history from progress note on 06/04/2021  Family History:  Family History  Problem Relation Age of Onset   Healthy Mother    Bone cancer Maternal Grandfather    Mental illness Neg Hx     Social History: Reviewed social history from progress note on 06/04/2021 Social History   Socioeconomic History   Marital status: Married    Spouse name: Savannah [name]   Number of children: 3   Years of education: Not on file   Highest education level: High school graduate  Occupational History   Not on file  Tobacco Use   Smoking status: Some Days    Packs/day: 0.25    Types: Cigarettes    Last attempt to quit: 06/14/2018    Years since quitting: 3.0    Passive exposure: Current   Smokeless tobacco: Never  Vaping Use   Vaping Use: Never used  Substance and Sexual Activity   Alcohol use: Not  Currently   Drug use: No   Sexual activity: Yes    Birth control/protection: I.U.D.  Other Topics Concern   Not on file  Social History Narrative   Not on file   Social Determinants of Health   Financial Resource Strain: Not on file  Food Insecurity: Not on file  Transportation Needs: Not on file  Physical Activity: Not on file  Stress: Not on file  Social Connections: Not on file    Allergies:  Allergies  Allergen Reactions   Ibuprofen Other (See Comments)    Burns stomach    Metabolic Disorder Labs: No results found for: HGBA1C, MPG No results found for:  PROLACTIN Lab Results  Component Value Date   CHOL 209 (H) 01/25/2021   TRIG 149 01/25/2021   HDL 37 (L) 01/25/2021   LDLCALC 145 (H) 01/25/2021   LDLCALC 104 (H) 06/11/2018   Lab Results  Component Value Date   TSH 2.120 01/25/2021   TSH 2.120 12/26/2019    Therapeutic Level Labs: No results found for: LITHIUM No results found for: VALPROATE No components found for:  CBMZ  Current Medications: Current Outpatient Medications  Medication Sig Dispense Refill   levonorgestrel (MIRENA) 20 MCG/24HR IUD 1 each once by Intrauterine route.     modafinil (PROVIGIL) 200 MG tablet Take 1 tablet (200 mg total) by mouth daily. 30 tablet 2   omeprazole (PRILOSEC) 40 MG capsule TAKE 1 CAPSULE (40 MG TOTAL) BY MOUTH DAILY. 30 capsule 3   DULoxetine (CYMBALTA) 20 MG capsule Take 1 capsule (20 mg total) by mouth daily. 90 capsule 0   No current facility-administered medications for this visit.     Musculoskeletal: Strength & Muscle Tone: within normal limits Gait & Station: normal Patient leans: N/A  Psychiatric Specialty Exam: Review of Systems  Psychiatric/Behavioral:  Positive for dysphoric mood. The patient is nervous/anxious.   All other systems reviewed and are negative.  Blood pressure 127/88, pulse 99, temperature (!) 97.5 F (36.4 C), temperature source Temporal, weight 177 lb 6.4 oz (80.5 kg).Body mass index is 31.42 kg/m.  General Appearance: Casual  Eye Contact:  Good  Speech:  Clear and Coherent  Volume:  Normal  Mood:  Anxious and Depressed  Affect:  Congruent  Thought Process:  Goal Directed and Descriptions of Associations: Intact  Orientation:  Full (Time, Place, and Person)  Thought Content: Logical   Suicidal Thoughts:  No  Homicidal Thoughts:  No  Memory:  Immediate;   Fair Recent;   Fair Remote;   Fair  Judgement:  Fair  Insight:  Fair  Psychomotor Activity:  Normal  Concentration:  Concentration: Fair and Attention Span: Fair  Recall:  [employer] of  Knowledge: Fair  Language: Fair  Akathisia:  No  Handed:  Right  AIMS (if indicated): done, 0  Assets:  Communication Skills Desire for Improvement Housing Social Support  ADL's:  Intact  Cognition: WNL  Sleep:  Fair   Screenings: Equities trader Office Visit from 06/20/2021 in [facility] Office Visit from 06/04/2021 in [facility] Office Visit from 03/07/2021 in [facility] Office Visit from 01/24/2021 in [facility] Office Visit from 01/03/2020 in [facility]  PHQ-2 Total Score 2 3 0 1 0  PHQ-9 Total Score 10 13 -- -- --      Flowsheet Row Office Visit from 06/20/2021 in [facility] ED from 11[facility]05/2020 in [facility]  ED from 07/19/2020 in [facility]   C-SSRS RISK CATEGORY No Risk No Risk No Risk        Assessment and Plan: Syann Cupples is a 36 year old Caucasian female, married, lives in [place], has a history of GAD, social anxiety disorder, trauma and stress related disorder, insomnia was evaluated in office today.  Patient is currently making some improvement.  Discussed plan as noted below.  Plan GAD-improving Duloxetine 20 mg p.o. daily Referred for CBT  Social anxiety disorder-unstable Patient has been referred for CBT Has upcoming appointment with Ms. Christina Hussami.  Trauma and stress related disorder unspecified-rule out PTSD Duloxetine 20 mg p.o. daily Referred for CBT.  Insomnia-improving Continue modafinil as prescribed by her provider Continue sleep hygiene techniques Discussed changing the dosage time of duloxetine to bedtime if it makes her tired or sleepy. Had multiple sleep studies completed in the past-CPAP was not recommended.   Tobacco use disorder-unstable Provided counseling for 5 minutes. Discussed NRT.  Follow-up in clinic in 6 weeks or sooner  if needed.  This note was generated in part or whole with voice recognition software. Voice recognition is usually quite accurate but there are transcription errors that can and very often do occur. I apologize for any typographical errors that were not detected and corrected.     Jomarie Longs, MD 06/21/2021, 2:01 PM

## 2021-07-02 ENCOUNTER — Other Ambulatory Visit: Payer: Self-pay | Admitting: Psychiatry

## 2021-07-02 DIAGNOSIS — F411 Generalized anxiety disorder: Secondary | ICD-10-CM

## 2021-07-02 DIAGNOSIS — F439 Reaction to severe stress, unspecified: Secondary | ICD-10-CM

## 2021-07-02 DIAGNOSIS — F4010 Social phobia, unspecified: Secondary | ICD-10-CM

## 2021-07-18 ENCOUNTER — Other Ambulatory Visit: Payer: Self-pay

## 2021-07-18 ENCOUNTER — Ambulatory Visit (INDEPENDENT_AMBULATORY_CARE_PROVIDER_SITE_OTHER): Payer: Commercial Managed Care - PPO | Admitting: Licensed Clinical Social Worker

## 2021-07-18 DIAGNOSIS — F411 Generalized anxiety disorder: Secondary | ICD-10-CM
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
## 2021-07-20 NOTE — Plan of Care (Signed)
Developed action plan with pt input ?

## 2021-07-20 NOTE — Plan of Care (Signed)
Developed treatment plan with pt input ?

## 2021-07-26 ENCOUNTER — Ambulatory Visit: Payer: Commercial Managed Care - PPO | Admitting: Psychiatry

## 2021-07-29 ENCOUNTER — Other Ambulatory Visit: Payer: Self-pay | Admitting: Physician Assistant

## 2021-07-29 DIAGNOSIS — K219 Gastro-esophageal reflux disease without esophagitis: Secondary | ICD-10-CM

## 2021-09-04 ENCOUNTER — Ambulatory Visit (INDEPENDENT_AMBULATORY_CARE_PROVIDER_SITE_OTHER): Payer: Commercial Managed Care - PPO | Admitting: Licensed Clinical Social Worker

## 2021-09-04 DIAGNOSIS — F411 Generalized anxiety disorder: Secondary | ICD-10-CM

## 2021-09-04 NOTE — Plan of Care (Signed)
?  Problem: Reduce overall frequency, intensity, and duration of the anxiety so that daily functioning is not impaired per pt self report 3 out of 5 sessions documented.  Anxiety Disorder CCP Problem  1  ?Goal: LTG: Patient will score less than 5 on the Generalized Anxiety Disorder 7 Scale (GAD-7) ?Outcome: Progressing ?Goal: STG: Patient will participate in at least 80% of scheduled individual psychotherapy sessions ?Outcome: Progressing ?  ?

## 2021-09-04 NOTE — Progress Notes (Signed)
? ?  THERAPIST PROGRESS NOTE ? ?Session Time: 9-940a ? ?[facility] in office visit for patient and LCSW clinician ? ?Participation Level: Active ? ?Behavioral Response: Neat and Well GroomedAlertAnxious and Depressed ? ?Type of Therapy: Individual Therapy ? ?Treatment Goals addressed:  ?Problem: Reduce overall frequency, intensity, and duration of the anxiety so that daily functioning is not impaired per pt self report 3 out of 5 sessions documented.  Anxiety Disorder CCP Problem  1  ?Goal: LTG: Patient will score less than 5 on the Generalized Anxiety Disorder 7 Scale (GAD-7) ?Outcome: Progressing ?Goal: STG: Patient will participate in at least 80% of scheduled individual psychotherapy sessions ?Outcome: Progressing ? ?ProgressTowards Goals: Progressing ? ?Interventions: CBT and Supportive ? ?Summary: Treca Tallmadge is a 36 y.o. female who presents with improving symptoms related to anxiety. Pt reports overall mood is stable and that she is trying hard to manage stress and anxiety.  ? ?Allowed pt to explore and express thoughts and feelings associated with recent life situations and external stressors.Patient reports that she's continuing to feel anxiety and stress, along with fatigue. Patient reports that she is working a lot. Patient reports that her wife is a good support system--patient feels really good about their relationship, and the positive psychological impact. Discussed patients relationship with children, and relationship with patients mother (at length). Reviewed current coping skills to manage overall mood, stress, and anxiety. ? ?Continued recommendations are as follows: self care behaviors, positive social engagements, focusing on overall work/home/life balance, and focusing on positive physical and emotional wellness.  ? ? ?Suicidal/Homicidal: No ? ?Therapist Response: Pt is continuing to apply interventions learned in session into daily life situations. Pt is currently on track to meet goals utilizing  interventions mentioned above. Personal growth and progress noted. Treatment to continue as indicated.  ? ?Plan: Return again in 4 weeks. ? ?Diagnosis: GAD (generalized anxiety disorder) ? ?Collaboration of Care: Other Pt encouraged to continue with psychiatrist of record, Dr. Ursula Alert ? ?Patient/Guardian was advised Release of Information must be obtained prior to any record release in order to collaborate their care with an outside provider. Patient/Guardian was advised if they have not already done so to contact the registration department to sign all necessary forms in order for [place] to release information regarding their care.  ? ?Consent: Patient/Guardian gives verbal consent for treatment and assignment of benefits for services provided during this visit. Patient/Guardian expressed understanding and agreed to proceed.  ? ?Jalysa Swopes R Misty Rago, LCSW ?09/04/2021 ? ?

## 2021-09-05 ENCOUNTER — Encounter: Payer: Self-pay | Admitting: Psychiatry

## 2021-09-05 ENCOUNTER — Ambulatory Visit (INDEPENDENT_AMBULATORY_CARE_PROVIDER_SITE_OTHER): Payer: Commercial Managed Care - PPO | Admitting: Psychiatry

## 2021-09-05 VITALS — BP 107/75 | HR 94 | Ht 63.0 in | Wt 180.0 lb

## 2021-09-05 DIAGNOSIS — F411 Generalized anxiety disorder: Secondary | ICD-10-CM | POA: Diagnosis not present

## 2021-09-05 DIAGNOSIS — F439 Reaction to severe stress, unspecified: Secondary | ICD-10-CM

## 2021-09-05 DIAGNOSIS — F4010 Social phobia, unspecified: Secondary | ICD-10-CM | POA: Diagnosis not present

## 2021-09-05 DIAGNOSIS — F17200 Nicotine dependence, unspecified, uncomplicated: Secondary | ICD-10-CM

## 2021-09-05 DIAGNOSIS — G4700 Insomnia, unspecified: Secondary | ICD-10-CM | POA: Diagnosis not present

## 2021-09-05 MED ORDER — VENLAFAXINE HCL ER 37.5 MG PO CP24: 37.5000 mg | ORAL_CAPSULE | Freq: Every day | ORAL | 1 refills | Status: DC

## 2021-09-05 MED ORDER — DULOXETINE HCL 20 MG PO CPEP: 20.0000 mg | ORAL_CAPSULE | ORAL | 0 refills | Status: DC

## 2021-09-05 NOTE — Progress Notes (Signed)
BH MD OP Progress Note ? ?09/05/2021 3:06 PM ?Randel Books  ?MRN:  [id number] ? ?Chief Complaint:  ?Chief Complaint  ?Patient presents with  ? Follow-up: 36 year old Caucasian female with history of GAD, social anxiety disorder, trauma and stress related disorder, insomnia, tobacco use disorder, presented for medication management.  ? ?HPI: Savannah [name] is a 36 year old Caucasian female, married, lives in [place], employed, has a history of GAD, social anxiety, trauma and stress related disorder, tobacco use disorder, presented for medication management. ? ?Patient today reports she feels her anxiety and mood symptoms have improved since being on the Cymbalta.  She feels more relaxed and better able to cope than before.  Patient however reports she likely continues to have side effects to the Cymbalta.  She feels excessively tired and sleepy on the Cymbalta.  She could sleep 8 to 16 hours a day if she would let herself to. ? ?Patient today reports she continues to be in psychotherapy.  She is motivated to stay in therapy. ? ?Reports work continues to be busy. ? ?She is interested in smoking cessation however reports work related stressors usually increases her smoking.  She is able to cut back on her smoking on weekends.  She is trying to find a way to deal with this. ? ?Patient denies any suicidality, homicidality or perceptual disturbances. ? ?Patient denies any other concerns today. ?Visit Diagnosis:  ?  ICD-10-CM   ?1. GAD (generalized anxiety disorder)  F41.1 DULoxetine (CYMBALTA) 20 MG capsule  ?  venlafaxine XR (EFFEXOR-XR) 37.5 MG 24 hr capsule  ?  ?2. Social anxiety disorder  F40.10 DULoxetine (CYMBALTA) 20 MG capsule  ?  venlafaxine XR (EFFEXOR-XR) 37.5 MG 24 hr capsule  ?  ?3. Trauma and stressor-related disorder  F43.9 DULoxetine (CYMBALTA) 20 MG capsule  ?  venlafaxine XR (EFFEXOR-XR) 37.5 MG 24 hr capsule  ?  ?4. Insomnia, unspecified type  G47.00   ?  ?5. Tobacco use disorder  F17.200   ?   ? ? ?Past Psychiatric History: Reviewed past psychiatric history from progress note on 06/04/2021.  Past trials of sertraline-headache, lorazepam-side effect, Klonopin-side effect, melatonin-did not work, Cymbalta-side effects. ? ?Past Medical History:  ?Past Medical History:  ?Diagnosis Date  ? Anxiety   ? Depression   ? Eardrum rupture, left   ? Headache   ?  ?Past Surgical History:  ?Procedure Laterality Date  ? CARPAL TUNNEL RELEASE Right 09/29/2014  ? Procedure: CARPAL TUNNEL RELEASE;  Surgeon: Myra Rude, MD;  Location: [facility] ORS;  Service: Orthopedics;  Laterality: Right;  ? CESAREAN SECTION    ? IUD placement    ? MYRINGOTOMY WITH TUBE PLACEMENT    ? ? ?Family Psychiatric History: Reviewed family psychiatric history from progress note on 06/04/2021. ? ?Family History:  ?Family History  ?Problem Relation Age of Onset  ? Healthy Mother   ? Bone cancer Maternal Grandfather   ? Mental illness Neg Hx   ? ? ?Social History: Reviewed social history from progress note on 06/04/2021. ?Social History  ? ?Socioeconomic History  ? Marital status: Married  ?  Spouse name: Natalia Leatherwood  ? Number of children: 3  ? Years of education: Not on file  ? Highest education level: High school graduate  ?Occupational History  ? Not on file  ?Tobacco Use  ? Smoking status: Some Days  ?  Packs/day: 0.25  ?  Types: Cigarettes  ?  Last attempt to quit: 06/14/2018  ?  Years since quitting: 3.2  ?  Passive exposure: Current  ? Smokeless tobacco: Never  ?Vaping Use  ? Vaping Use: Never used  ?Substance and Sexual Activity  ? Alcohol use: Not Currently  ? Drug use: No  ? Sexual activity: Yes  ?  Birth control/protection: I.U.D.  ?Other Topics Concern  ? Not on file  ?Social History Narrative  ? Not on file  ? ?Social Determinants of Health  ? ?Financial Resource Strain: Not on file  ?Food Insecurity: Not on file  ?Transportation Needs: Not on file  ?Physical Activity: Not on file  ?Stress: Not on file  ?Social Connections: Not on file   ? ? ?Allergies:  ?Allergies  ?Allergen Reactions  ? Ibuprofen Other (See Comments)  ?  Burns stomach  ? ? ?Metabolic Disorder Labs: ?No results found for: HGBA1C, MPG ?No results found for: PROLACTIN ?Lab Results  ?Component Value Date  ? CHOL 209 (H) 01/25/2021  ? TRIG 149 01/25/2021  ? HDL 37 (L) 01/25/2021  ? LDLCALC 145 (H) 01/25/2021  ? LDLCALC 104 (H) 06/11/2018  ? ?Lab Results  ?Component Value Date  ? TSH 2.120 01/25/2021  ? TSH 2.120 12/26/2019  ? ? ?Therapeutic Level Labs: ?No results found for: LITHIUM ?No results found for: VALPROATE ?No components found for:  CBMZ ? ?Current Medications: ?Current Outpatient Medications  ?Medication Sig Dispense Refill  ? levonorgestrel (MIRENA) 20 MCG/24HR IUD 1 each once by Intrauterine route.    ? modafinil (PROVIGIL) 200 MG tablet Take 1 tablet (200 mg total) by mouth daily. 30 tablet 2  ? omeprazole (PRILOSEC) 40 MG capsule TAKE 1 CAPSULE (40 MG TOTAL) BY MOUTH DAILY. 30 capsule 3  ? venlafaxine XR (EFFEXOR-XR) 37.5 MG 24 hr capsule Take 1 capsule (37.5 mg total) by mouth daily with breakfast. 30 capsule 1  ? DULoxetine (CYMBALTA) 20 MG capsule Take 1 capsule (20 mg total) by mouth as directed. Take every other day for 7 doses and stop 90 capsule 0  ? ?No current facility-administered medications for this visit.  ? ? ? ?Musculoskeletal: ?Strength & Muscle Tone: within normal limits ?Gait & Station: normal ?Patient leans: N/A ? ?Psychiatric Specialty Exam: ?Review of Systems  ?Psychiatric/Behavioral:  Positive for sleep disturbance. The patient is nervous/anxious.   ?All other systems reviewed and are negative.  ?Blood pressure 107/75, pulse 94, height 5\' 3"  (1.6 m), weight 180 lb (81.6 kg).Body mass index is 31.89 kg/m?.  ?General Appearance: Casual  ?Eye Contact:  Fair  ?Speech:  Clear and Coherent  ?Volume:  Normal  ?Mood:  Anxious  ?Affect:  Congruent  ?Thought Process:  Goal Directed and Descriptions of Associations: Intact  ?Orientation:  Full (Time, Place,  and Person)  ?Thought Content: Logical   ?Suicidal Thoughts:  No  ?Homicidal Thoughts:  No  ?Memory:  Immediate;   Fair ?Recent;   Fair ?Remote;   Fair  ?Judgement:  Fair  ?Insight:  Fair  ?Psychomotor Activity:  Normal  ?Concentration:  Concentration: Fair and Attention Span: Fair  ?Recall:  Fair  ?Fund of Knowledge: Fair  ?Language: Fair  ?Akathisia:  No  ?Handed:  Right  ?AIMS (if indicated): done,0  ?Assets:  Communication Skills ?Desire for Improvement ?Housing ?Intimacy ?Social Support ?Talents/Skills ?Transportation  ?ADL's:  Intact  ?Cognition: WNL  ?Sleep:   Excessive  ? ?Screenings: ?AIMS   ? ?Flowsheet Row Office Visit from 09/05/2021 in [facility]  ?AIMS Total Score 0  ? ?  ? ?GAD-7   ? ?Flowsheet Row [facility]18/2021 in [facility]  ?Total GAD-7 Score 8 18  ? ?  ? ?PHQ2-9   ? ?Flowsheet Row Office Visit from 09/05/2021 in [facility] Counselor from 07/18/2021 in [facility] Office Visit from 06/20/2021 in [facility] Office Visit from 06/04/2021 in [facility] Office Visit from 03/07/2021 in [facility] [place]  ?PHQ-2 Total Score 1 3 2 3  0  ?PHQ-9 Total Score 6 12 10 13  --  ? ?  ? ?Flowsheet Row Counselor from 09/04/2021 in [facility] Counselor from 07/18/2021 in [facility] Office Visit from 06/20/2021 in [facility]  ?C-SSRS RISK CATEGORY No Risk No Risk No Risk  ? ?  ? ? ? ?Assessment and Plan: Jannine Abreu is a 36 year old Caucasian female, married, lives in [place], has a history of GAD, social anxiety disorder, trauma and stress related disorder, insomnia was evaluated in office today.  Patient with possible adverse side effects to Cymbalta, will benefit  from the following plan. ? ?Plan ?GAD-improving ?However we will taper off duloxetine/Cymbalta 20 mg .  Patient advised to take it every other day for the next 7 days and stop taking it. ?Continue CBT ?Start venlafa

## 2021-09-05 NOTE — Patient Instructions (Signed)
Steps to Quit Smoking ?Smoking tobacco is the leading cause of preventable death. It can affect almost every organ in the body. Smoking puts you and people around you at risk for many serious, long-lasting (chronic) diseases. Quitting smoking can be hard, but it is one of the best things that you can do for your health. It is never too late to quit. ?Do not give up if you cannot quit the first time. Some people need to try many times to quit. Do your best to stick to your quit plan, and talk with your doctor if you have any questions or concerns. ?How do I get ready to quit? ?Pick a date to quit. Set a date within the next 2 weeks to give you time to prepare. ?Write down the reasons why you are quitting. Keep this list in places where you will see it often. ?Tell your family, friends, and co-workers that you are quitting. Their support is important. ?Talk with your doctor about the choices that may help you quit. ?Find out if your health insurance will pay for these treatments. ?Know the people, places, things, and activities that make you want to smoke (triggers). Avoid them. ?What first steps can I take to quit smoking? ?Throw away all cigarettes at home, at work, and in your car. ?Throw away the things that you use when you smoke, such as ashtrays and lighters. ?Clean your car. Empty the ashtray. ?Clean your home, including curtains and carpets. ?What can I do to help me quit smoking? ?Talk with your doctor about taking medicines and seeing a counselor. You are more likely to succeed when you do both. ?If you are pregnant or breastfeeding: ?Talk with your doctor about counseling or other ways to quit smoking. ?Do not take medicine to help you quit smoking unless your doctor tells you to. ?Quit right away ?Quit smoking completely, instead of slowly cutting back on how much you smoke over a period of time. Stopping smoking right away may be more successful than slowly quitting. ?Go to counseling. In-person is best  if this is an option. You are more likely to quit if you go to counseling sessions regularly. ?Take medicine ?You may take medicines to help you quit. Some medicines need a prescription, and some you can buy over-the-counter. Some medicines may contain a drug called nicotine to replace the nicotine in cigarettes. Medicines may: ?Help you stop having the desire to smoke (cravings). ?Help to stop the problems that come when you stop smoking (withdrawal symptoms). ?Your doctor may ask you to use: ?Nicotine patches, gum, or lozenges. ?Nicotine inhalers or sprays. ?Non-nicotine medicine that you take by mouth. ?Find resources ?Find resources and other ways to help you quit smoking and remain smoke-free after you quit. They include: ?Online chats with a Social worker. ?Phone quitlines. ?Careers information officer. ?Support groups or group counseling. ?Text messaging programs. ?Mobile phone apps. Use apps on your mobile phone or tablet that can help you stick to your quit plan. Examples of free services include Quit Guide from the CDC and smokefree.gov ? ?What can I do to make it easier to quit? ? ?Talk to your family and friends. Ask them to support and encourage you. ?Call a phone quitline, such as 1-800-QUIT-NOW, reach out to support groups, or work with a Social worker. ?Ask people who smoke to not smoke around you. ?Avoid places that make you want to smoke, such as: ?Bars. ?Parties. ?Smoke-break areas at work. ?Spend time with people who do not smoke. ?Lower  the stress in your life. Stress can make you want to smoke. Try these things to lower stress: ?Getting regular exercise. ?Doing deep-breathing exercises. ?Doing yoga. ?Meditating. ?What benefits will I see if I quit smoking? ?Over time, you may have: ?A better sense of smell and taste. ?Less coughing and sore throat. ?A slower heart rate. ?Lower blood pressure. ?Clearer skin. ?Better breathing. ?Fewer sick days. ?Summary ?Quitting smoking can be hard, but it is one of  the best things that you can do for your health. ?Do not give up if you cannot quit the first time. Some people need to try many times to quit. ?When you decide to quit smoking, make a plan to help you succeed. ?Quit smoking right away, not slowly over a period of time. ?When you start quitting, get help and support to keep you smoke-free. ?This information is not intended to replace advice given to you by your health care provider. Make sure you discuss any questions you have with your health care provider. ?Document Revised: 04/19/2021 Document Reviewed: 04/19/2021 ?Elsevier Patient Education ? 2023 [employer] ?Venlafaxine Extended-Release Capsules ?What is this medication? ?VENLAFAXINE (VEN la fax een) treats depression and anxiety. It increases the amount of serotonin and norepinephrine in the brain, hormones that help regulate mood. It belongs to a group of medications called SNRIs. ?This medicine may be used for other purposes; ask your health care provider or pharmacist if you have questions. ?COMMON BRAND NAME(S): Effexor XR ?What should I tell my care team before I take this medication? ?They need to know if you have any of these conditions: ?Bleeding disorders ?Glaucoma ?Heart disease ?High blood pressure ?High cholesterol ?Kidney disease ?Liver disease ?Low levels of sodium in the blood ?Mania or bipolar disorder ?Seizures ?Suicidal thoughts, plans, or attempt; a previous suicide attempt by you or a family ?Take medications that treat or prevent blood clots ?Thyroid disease ?An unusual or allergic reaction to venlafaxine, desvenlafaxine, other medications, foods, dyes, or preservatives ?Pregnant or trying to get pregnant ?Breast-feeding ?How should I use this medication? ?Take this medication by mouth with a full glass of water. Follow the directions on the prescription label. Do not cut, crush, or chew this medication. Take it with food. If needed, the capsule may be carefully opened and the entire  contents sprinkled on a spoonful of cool applesauce. Swallow the applesauce/pellet mixture right away without chewing and follow with a glass of water to ensure complete swallowing of the pellets. Try to take your medication at about the same time each day. Do not take your medication more often than directed. Do not stop taking this medication suddenly except upon the advice of your care team. Stopping this medication too quickly may cause serious side effects or your condition may worsen. ?A special MedGuide will be given to you by the pharmacist with each prescription and refill. Be sure to read this information carefully each time. ?Talk to your care team regarding the use of this medication in children. Special care may be needed. ?Overdosage: If you think you have taken too much of this medicine contact a poison control center or emergency room at once. ?NOTE: This medicine is only for you. Do not share this medicine with others. ?What if I miss a dose? ?If you miss a dose, take it as soon as you can. If it is almost time for your next dose, take only that dose. Do not take double or extra doses. ?What may interact with this medication? ?Do not  take this medication with any of the following: ?Certain medications for fungal infections like fluconazole, itraconazole, ketoconazole, posaconazole, voriconazole ?Cisapride ?Desvenlafaxine ?Dronedarone ?Duloxetine ?Levomilnacipran ?Linezolid ?MAOIs like Carbex, Eldepryl, Marplan, Nardil, and Parnate ?Methylene blue (injected into a vein) ?Milnacipran ?Pimozide ?Thioridazine ?This medication may also interact with the following: ?Amphetamines ?Aspirin and aspirin-like medications ?Certain medications for depression, anxiety, or psychotic disturbances ?Certain medications for migraine headaches like almotriptan, eletriptan, frovatriptan, naratriptan, rizatriptan, sumatriptan, zolmitriptan ?Certain medications for sleep ?Certain medications that treat or prevent blood  clots like dalteparin, enoxaparin, warfarin ?Cimetidine ?Clozapine ?Diuretics ?Fentanyl ?Furazolidone ?Indinavir ?Isoniazid ?Lithium ?Metoprolol ?NSAIDS, medications for pain and inflammation, like ibuprofe

## 2021-09-10 ENCOUNTER — Other Ambulatory Visit: Payer: Self-pay | Admitting: Physician Assistant

## 2021-09-10 DIAGNOSIS — G4726 Circadian rhythm sleep disorder, shift work type: Secondary | ICD-10-CM

## 2021-09-16 ENCOUNTER — Telehealth: Payer: Self-pay

## 2021-09-16 NOTE — Telephone Encounter (Signed)
Mb full, sent mychart message to move 10/10/21 appointment-Toni ?

## 2021-09-29 ENCOUNTER — Other Ambulatory Visit: Payer: Self-pay | Admitting: Psychiatry

## 2021-09-29 DIAGNOSIS — F439 Reaction to severe stress, unspecified: Secondary | ICD-10-CM

## 2021-09-29 DIAGNOSIS — F4010 Social phobia, unspecified: Secondary | ICD-10-CM

## 2021-09-29 DIAGNOSIS — F411 Generalized anxiety disorder: Secondary | ICD-10-CM

## 2021-10-10 ENCOUNTER — Ambulatory Visit: Payer: Commercial Managed Care - PPO | Admitting: Internal Medicine

## 2021-10-18 ENCOUNTER — Encounter: Payer: Self-pay | Admitting: Psychiatry

## 2021-10-18 ENCOUNTER — Ambulatory Visit (INDEPENDENT_AMBULATORY_CARE_PROVIDER_SITE_OTHER): Payer: Commercial Managed Care - PPO | Admitting: Psychiatry

## 2021-10-18 VITALS — BP 117/84 | HR 81 | Temp 98.3°F | Wt 183.6 lb

## 2021-10-18 DIAGNOSIS — F439 Reaction to severe stress, unspecified: Secondary | ICD-10-CM | POA: Diagnosis not present

## 2021-10-18 DIAGNOSIS — G4701 Insomnia due to medical condition: Secondary | ICD-10-CM | POA: Diagnosis not present

## 2021-10-18 DIAGNOSIS — F4010 Social phobia, unspecified: Secondary | ICD-10-CM | POA: Diagnosis not present

## 2021-10-18 DIAGNOSIS — F411 Generalized anxiety disorder: Secondary | ICD-10-CM

## 2021-10-18 DIAGNOSIS — F17200 Nicotine dependence, unspecified, uncomplicated: Secondary | ICD-10-CM

## 2021-10-18 MED ORDER — VENLAFAXINE HCL ER 37.5 MG PO CP24: 37.5000 mg | ORAL_CAPSULE | Freq: Every day | ORAL | 0 refills | Status: DC

## 2021-10-18 NOTE — Progress Notes (Signed)
BH MD OP Progress Note  10/18/2021 9:37 AM Savannah [name]  MRN:  [id number]  Chief Complaint:  Chief Complaint  Patient presents with   Follow-up: 36 year old Caucasian female with history of GAD, social anxiety disorder, trauma and stress related disorder, insomnia, tobacco use disorder, presented for medication management.   HPI: Savannah [name] is a 36 year old Caucasian female, married, lives in [place], employed, has a history of GAD, social anxiety disorder, trauma and stress related disorder, tobacco use disorder, presented for medication management.  Patient today reports she is compliant on the venlafaxine.  She reports she has noticed an improvement in her mood.  She does not have any significant mood swings .  Although there has been times when she has had anxiety or sadness she feels it is not as ' deep ' as it used to be.  She does report she may be having hot flashes, not sure if these are true hot flashes or since the weather is getting warmer and she works in a factory environment, it could be due to the same.  She is agreeable to monitoring this.  Continues to have anxiety especially in social situations.  She however is currently working with her therapist.  Patient reports sleep as good.  Denies any suicidality, homicidality or perceptual disturbances.  Patient denies any other concerns today.  Visit Diagnosis:    ICD-10-CM   1. GAD (generalized anxiety disorder)  F41.1 venlafaxine XR (EFFEXOR-XR) 37.5 MG 24 hr capsule    2. Social anxiety disorder  F40.10 venlafaxine XR (EFFEXOR-XR) 37.5 MG 24 hr capsule    3. Trauma and stressor-related disorder  F43.9 venlafaxine XR (EFFEXOR-XR) 37.5 MG 24 hr capsule    4. Insomnia due to medical condition  G47.01    mood    5. Tobacco use disorder  F17.200       Past Psychiatric History: Reviewed past psychiatric history from progress note on 06/04/2021.  Past trials of sertraline-headaches, lorazepam-side effect,  Klonopin-side effect, melatonin-did not work, Cymbalta-side effects.  Past Medical History:  Past Medical History:  Diagnosis Date   Anxiety    Depression    Eardrum rupture, left    Headache     Past Surgical History:  Procedure Laterality Date   CARPAL TUNNEL RELEASE Right 09/29/2014   Procedure: CARPAL TUNNEL RELEASE;  Surgeon: Myra Rude, MD;  Location: [facility] ORS;  Service: Orthopedics;  Laterality: Right;   CESAREAN SECTION     IUD placement     MYRINGOTOMY WITH TUBE PLACEMENT      Family Psychiatric History: Reviewed family psychiatric history from progress note on 06/04/2021.  Family History:  Family History  Problem Relation Age of Onset   Healthy Mother    Bone cancer Maternal Grandfather    Mental illness Neg Hx     Social History: Reviewed social history from progress note on 06/04/2021. Social History   Socioeconomic History   Marital status: Married    Spouse name: katherine   Number of children: 3   Years of education: Not on file   Highest education level: High school graduate  Occupational History   Not on file  Tobacco Use   Smoking status: Some Days    Packs/day: 0.25    Types: Cigarettes    Last attempt to quit: 06/14/2018    Years since quitting: 3.3    Passive exposure: Current   Smokeless tobacco: Never  Vaping Use   Vaping Use: Never used  Substance and Sexual Activity   Alcohol  use: Not Currently   Drug use: No   Sexual activity: Yes    Birth control/protection: I.U.D.  Other Topics Concern   Not on file  Social History Narrative   Not on file   Social Determinants of Health   Financial Resource Strain: Not on file  Food Insecurity: Not on file  Transportation Needs: Not on file  Physical Activity: Inactive (03/26/2017)   Exercise Vital Sign    Days of Exercise per Week: 0 days    Minutes of Exercise per Session: 0 min  Stress: Stress Concern Present (03/26/2017)   [employer] - Occupational  Stress Questionnaire    Feeling of Stress : To some extent  Social Connections: Somewhat Isolated (03/26/2017)   Social Connection and Isolation Panel [NHANES]    Frequency of Communication with Friends and Family: Three times a week    Frequency of Social Gatherings with Friends and Family: Once a week    Attends Religious Services: Never    Database administratorActive Member of Clubs or Organizations: No    Attends BankerClub or Organization Meetings: Never    Marital Status: Living with partner    Allergies:  Allergies  Allergen Reactions   Ibuprofen Other (See Comments)    Burns stomach    Metabolic Disorder Labs: No results found for: "HGBA1C", "MPG" No results found for: "PROLACTIN" Lab Results  Component Value Date   CHOL 209 (H) 01/25/2021   TRIG 149 01/25/2021   HDL 37 (L) 01/25/2021   LDLCALC 145 (H) 01/25/2021   LDLCALC 104 (H) 06/11/2018   Lab Results  Component Value Date   TSH 2.120 01/25/2021   TSH 2.120 12/26/2019    Therapeutic Level Labs: No results found for: "LITHIUM" No results found for: "VALPROATE" No results found for: "CBMZ"  Current Medications: Current Outpatient Medications  Medication Sig Dispense Refill   levonorgestrel (MIRENA) 20 MCG/24HR IUD 1 each once by Intrauterine route.     modafinil (PROVIGIL) 200 MG tablet TAKE 1 TABLET BY MOUTH EVERY DAY 30 tablet 0   omeprazole (PRILOSEC) 40 MG capsule TAKE 1 CAPSULE (40 MG TOTAL) BY MOUTH DAILY. 30 capsule 3   venlafaxine XR (EFFEXOR-XR) 37.5 MG 24 hr capsule Take 1 capsule (37.5 mg total) by mouth daily with breakfast. 90 capsule 0   No current facility-administered medications for this visit.     Musculoskeletal: Strength & Muscle Tone: within normal limits Gait & Station: normal Patient leans: N/A  Psychiatric Specialty Exam: Review of Systems  Psychiatric/Behavioral:  The patient is nervous/anxious.   All other systems reviewed and are negative.   Blood pressure 117/84, pulse 81, temperature 98.3 F  (36.8 C), temperature source Temporal, weight 183 lb 9.6 oz (83.3 kg).Body mass index is 32.52 kg/m.  General Appearance: Casual  Eye Contact:  Fair  Speech:  Clear and Coherent  Volume:  Normal  Mood:  Anxious  Affect:  Congruent  Thought Process:  Goal Directed and Descriptions of Associations: Intact  Orientation:  Full (Time, Place, and Person)  Thought Content: Logical   Suicidal Thoughts:  No  Homicidal Thoughts:  No  Memory:  Immediate;   Fair Recent;   Fair Remote;   Fair  Judgement:  Fair  Insight:  Fair  Psychomotor Activity:  Normal  Concentration:  Concentration: Fair and Attention Span: Fair  Recall:  FiservFair  Fund of Knowledge: Fair  Language: Fair  Akathisia:  No  Handed:  Right  AIMS (if indicated): done  Assets:  Communication  Skills Desire for Improvement Housing Intimacy Social Support  ADL's:  Intact  Cognition: WNL  Sleep:  Fair   Screenings: AIMS    Flowsheet Row Office Visit from 10/18/2021 in [facility] Office Visit from 09/05/2021 in [facility]  AIMS Total Score 0 0      GAD-7    Flowsheet Row Office Visit from 10/18/2021 in [facility] Office Visit from 09/05/2021 in [facility] Counselor from 07/18/2021 in [facility]  Total GAD-7 Score 4 8 18       PHQ2-9    Flowsheet Row Office Visit from 10/18/2021 in [facility] Office Visit from 09/05/2021 in [facility] Counselor from 07/18/2021 in [facility] Office Visit from 06/20/2021 in [facility] Office Visit from 06/04/2021 in [facility]  PHQ-2 Total Score 2 1 3 2 3   PHQ-9 Total Score 6 6 12 10 13       Flowsheet Row Office Visit from 10/18/2021 in [facility] Counselor from 09/04/2021 in  [facility] Counselor from 07/18/2021 in [facility]  C-SSRS RISK CATEGORY No Risk No Risk No Risk        Assessment and Plan: Savannah [name] is a 36 year old Caucasian female, married, lives in [place], has a history of GAD, social anxiety disorder, trauma and stress related disorder, insomnia was evaluated in office today.  Patient is currently improving.  Plan as noted below.  Plan GAD-improving Continue venlafaxine extended release 37.5 mg p.o. daily with breakfast Continue CBT with Ms. Christina Hussami  Social anxiety disorder-improving Continue CBT  Trauma and stress related disorder-rule out PTSD Continue CBT Venlafaxine extended release 37.5 mg p.o. daily  Insomnia-improving Continue modafinil as prescribed by her provider. She had multiple sleep studies in the past-CPAP was not recommended.  Tobacco use disorder-improving Provided counseling for 1 minute.  Follow-up in clinic in 2 months or sooner if needed.   This note was generated in part or whole with voice recognition software. Voice recognition is usually quite accurate but there are transcription errors that can and very often do occur. I apologize for any typographical errors that were not detected and corrected.     Randel Books, MD 10/18/2021, 9:37 AM

## 2021-10-23 ENCOUNTER — Ambulatory Visit (INDEPENDENT_AMBULATORY_CARE_PROVIDER_SITE_OTHER): Payer: Commercial Managed Care - PPO | Admitting: Licensed Clinical Social Worker

## 2021-10-23 DIAGNOSIS — F4010 Social phobia, unspecified: Secondary | ICD-10-CM | POA: Diagnosis not present

## 2021-10-23 DIAGNOSIS — F439 Reaction to severe stress, unspecified: Secondary | ICD-10-CM | POA: Diagnosis not present

## 2021-10-23 DIAGNOSIS — F411 Generalized anxiety disorder: Secondary | ICD-10-CM

## 2021-10-23 NOTE — Progress Notes (Signed)
THERAPIST PROGRESS NOTE  Session Time: 9-10a  [facility] in office visit for patient, counseling intern [name], and LCSW clinician. Pt gave verbal consent for [name] to be present throughout counseling session.  Participation Level: Active  Behavioral Response: Neat and Well GroomedAlertAnxious and Depressed  Type of Therapy: Individual Therapy  Treatment Goals addressed:  Problem: Anxiety Disorder Goal:  Reduce overall frequency, intensity, and duration of the anxiety so that daily functioning is not impaired per pt self report 3 out of 5 sessions documented.   Outcome: Progressing Goal: Enhance ability to effectively cope with the full variety of life's worries and anxiety per self report 3 out of 5 sessions documented  Outcome: Progressing Goal: STG: Patient will participate in at least 80% of scheduled individual psychotherapy sessions Outcome: Progressing Intervention: Assist with relaxation techniques, as appropriate (deep breathing exercises, meditation, guided imagery) Note: Review   Intervention: Encourage verbalization of feelings/concerns/expectations Note: Allowed/explored Intervention: Encourage self-care activities Note: Encouraged   ProgressTowards Goals: Progressing  Interventions: CBT and Supportive  Summary: Savannah [name] is a 36 y.o. female who presents with improving symptoms related to anxiety. Pt reports overall mood is stable and that she is trying hard to manage stress and anxiety.   Allowed pt to explore and express thoughts and feelings associated with recent life situations and external stressors.Patient reports that she is continuing to experience stress associated with work schedule, and 3rd shift schedule. Patient reports that at work there is going to be some swinging in the shifts due to new work related activities and procedures that require additional training period patient reports that the training is going to happen during first  shift hours, which patient is not used to. Encourage patient to stay as much in a structured time frame as possible throughout this transition.  Patient reports that overall her mood is stable, and that she feels that she is managing situational stress as well. Patient feels that her wife and children are supportive and help her out a lot around the house. Patient reports that she is continuing to set limits and boundaries with family members, and are estranged from some of them.  Encouraged patient to continue to stay in counseling to help process through thoughts and feelings associated with traumas from the past, and overall psychological impact.  Continued recommendations are as follows: self care behaviors, positive social engagements, focusing on overall work/home/life balance, and focusing on positive physical and emotional wellness.    Suicidal/Homicidal: No  Therapist Response: Pt is continuing to apply interventions learned in session into daily life situations. Pt is currently on track to meet goals utilizing interventions mentioned above. Personal growth and progress noted. Treatment to continue as indicated.   Informed pt of clinicians upcoming transition to [facility] Out[facility] office. Pt elects to continue counseling services PRN "I don't do ".   Plan: Return again in 4 weeks.  Diagnosis: GAD (generalized anxiety disorder)  Social anxiety disorder  Trauma and stressor-related disorder  Collaboration of Care: Other Pt encouraged to continue with psychiatrist of record, Dr. Jomarie Longs  Patient/Guardian was advised Release of Information must be obtained prior to any record release in order to collaborate their care with an outside provider. Patient/Guardian was advised if they have not already done so to contact the registration department to sign all necessary forms in order for [place] to release information regarding their care.   Consent:  Patient/Guardian gives verbal consent for treatment and assignment of benefits for services provided  during this visit. Patient/Guardian expressed understanding and agreed to proceed.   Ernest Haber Sarajane Fambrough, LCSW 10/23/2021

## 2021-10-23 NOTE — Plan of Care (Signed)
  Problem: Anxiety Disorder Goal:  Reduce overall frequency, intensity, and duration of the anxiety so that daily functioning is not impaired per pt self report 3 out of 5 sessions documented.   Outcome: Progressing Goal: Enhance ability to effectively cope with the full variety of life's worries and anxiety per self report 3 out of 5 sessions documented  Outcome: Progressing Goal: STG: Patient will participate in at least 80% of scheduled individual psychotherapy sessions Outcome: Progressing Intervention: Assist with relaxation techniques, as appropriate (deep breathing exercises, meditation, guided imagery) Note: Review  Intervention: Encourage verbalization of feelings/concerns/expectations Note: Allowed/explored Intervention: Encourage self-care activities Note: Encouraged

## 2021-10-24 ENCOUNTER — Encounter: Payer: Self-pay | Admitting: Internal Medicine

## 2021-10-24 ENCOUNTER — Ambulatory Visit: Payer: Commercial Managed Care - PPO | Admitting: Internal Medicine

## 2021-10-24 VITALS — BP 124/92 | HR 99 | Temp 98.3°F | Resp 16 | Ht 63.0 in | Wt 183.6 lb

## 2021-10-24 DIAGNOSIS — G4726 Circadian rhythm sleep disorder, shift work type: Secondary | ICD-10-CM | POA: Diagnosis not present

## 2021-10-24 NOTE — Progress Notes (Signed)
[facility] [address] [place], [place] [address]  Pulmonary Sleep Medicine   Office Visit Note  Patient Name: Savannah [name] DOB: 1986-03-04 MRN [id number]  Date of Service: 10/24/2021  Complaints/HPI: work shift disorder. She has been taking provigil at work and this does seem to help. Sometimes does not feel it lasts as long. She does definitely note there is a difference whe she does not take the provigil. So it appears she is less foggy and more alert with the provigil. I did suggest she take the provigil a little later at start of work  ROS  General: (-) fever, (-) chills, (-) night sweats, (-) weakness Skin: (-) rashes, (-) itching,. Eyes: (-) visual changes, (-) redness, (-) itching. Nose and Sinuses: (-) nasal stuffiness or itchiness, (-) postnasal drip, (-) nosebleeds, (-) sinus trouble. Mouth and Throat: (-) sore throat, (-) hoarseness. Neck: (-) swollen glands, (-) enlarged thyroid, (-) neck pain. Respiratory: - cough, (-) bloody sputum, - shortness of breath, - wheezing. Cardiovascular: - ankle swelling, (-) chest pain. Lymphatic: (-) lymph node enlargement. Neurologic: (-) numbness, (-) tingling. Psychiatric: (-) anxiety, (-) depression   Current Medication: Outpatient Encounter Medications as of 10/24/2021  Medication Sig   levonorgestrel (MIRENA) 20 MCG/24HR IUD 1 each once by Intrauterine route.   modafinil (PROVIGIL) 200 MG tablet TAKE 1 TABLET BY MOUTH EVERY DAY   omeprazole (PRILOSEC) 40 MG capsule TAKE 1 CAPSULE (40 MG TOTAL) BY MOUTH DAILY.   venlafaxine XR (EFFEXOR-XR) 37.5 MG 24 hr capsule Take 1 capsule (37.5 mg total) by mouth daily with breakfast.   No facility-administered encounter medications on file as of 10/24/2021.    Surgical History: Past Surgical History:  Procedure Laterality Date   CARPAL TUNNEL RELEASE Right 09/29/2014   Procedure: CARPAL TUNNEL RELEASE;  Surgeon: Myra Rude, MD;  Location: [facility] ORS;  Service:  Orthopedics;  Laterality: Right;   CESAREAN SECTION     IUD placement     MYRINGOTOMY WITH TUBE PLACEMENT      Medical History: Past Medical History:  Diagnosis Date   Anxiety    Depression    Eardrum rupture, left    Headache     Family History: Family History  Problem Relation Age of Onset   Healthy Mother    Bone cancer Maternal Grandfather    Mental illness Neg Hx     Social History: Social History   Socioeconomic History   Marital status: Married    Spouse name: katherine   Number of children: 3   Years of education: Not on file   Highest education level: High school graduate  Occupational History   Not on file  Tobacco Use   Smoking status: Some Days    Packs/day: 0.25    Types: Cigarettes    Last attempt to quit: 06/14/2018    Years since quitting: 3.3    Passive exposure: Current   Smokeless tobacco: Never  Vaping Use   Vaping Use: Never used  Substance and Sexual Activity   Alcohol use: Not Currently   Drug use: No   Sexual activity: Yes    Birth control/protection: I.U.D.  Other Topics Concern   Not on file  Social History Narrative   Not on file   Social Determinants of Health   Financial Resource Strain: Not on file  Food Insecurity: Not on file  Transportation Needs: Not on file  Physical Activity: Inactive (03/26/2017)   Exercise Vital Sign    Days of Exercise per Week:  0 days    Minutes of Exercise per Session: 0 min  Stress: Stress Concern Present (03/26/2017)   [employer] of Occupational Health - Occupational Stress Questionnaire    Feeling of Stress : To some extent  Social Connections: Somewhat Isolated (03/26/2017)   Social Connection and Isolation Panel [NHANES]    Frequency of Communication with Friends and Family: Three times a week    Frequency of Social Gatherings with Friends and Family: Once a week    Attends Religious Services: Never    Database administrator or Organizations: No    Attends Banker  Meetings: Never    Marital Status: Living with partner  Intimate Partner Violence: Not At Risk (03/26/2017)   Humiliation, Afraid, Rape, and Kick questionnaire    Fear of Current or Ex-Partner: No    Emotionally Abused: No    Physically Abused: No    Sexually Abused: No    Vital Signs: Blood pressure (!) 124/92, pulse 99, temperature 98.3 F (36.8 C), resp. rate 16, height 5\' 3"  (1.6 m), weight 183 lb 9.6 oz (83.3 kg), SpO2 97 %.  Examination: General Appearance: The patient is well-developed, well-nourished, and in no distress. Skin: Gross inspection of skin unremarkable. Head: normocephalic, no gross deformities. Eyes: no gross deformities noted. ENT: ears appear grossly normal no exudates. Neck: Supple. No thyromegaly. No LAD. Respiratory: no rhonchi noted. Cardiovascular: Normal S1 and S2 without murmur or rub. Extremities: No cyanosis. pulses are equal. Neurologic: Alert and oriented. No involuntary movements.  LABS: No results found for this or any previous visit (from the past 2160 hour(s)).  Radiology: No results found.  No results found.  No results found.    Assessment and Plan: Patient Active Problem List   Diagnosis Date Noted   GAD (generalized anxiety disorder) 06/05/2021   Social anxiety disorder 06/05/2021   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   Insomnia 06/05/2021   Tobacco use disorder 06/05/2021   Encounter for general adult medical examination with abnormal findings 01/17/2020   Dysuria 01/17/2020   Constipation 10/18/2019   Chronic right shoulder pain 10/18/2019   Leg cramps 08/24/2019   Upper back pain on left side 06/21/2019   Need for prophylactic vaccination with combined diphtheria-tetanus-pertussis (DTaP) vaccine 03/18/2019   Circadian rhythm sleep disorder, 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   Generalized abdominal tenderness without rebound tenderness 02/03/2019   Chronic low back pain 02/03/2019   Excessive daytime sleepiness  08/29/2018   Fatigue 08/29/2018   Atopic dermatitis 08/17/2018   Gastroesophageal reflux disease without esophagitis 08/17/2018   Vitamin D deficiency 08/17/2018   Carpal tunnel syndrome 06/01/2018   Bacterial folliculitis 06/01/2018   Irritable bowel syndrome with both constipation and diarrhea 06/01/2018   Genital warts 03/26/2017   Depression 01/11/2015   Psoriasis 01/11/2015   Ankle pain 01/11/2015    1. Circadian rhythm sleep disorder, shift work type She should continue with working on her sleep hygiene.  Primary issue is the circadian issue from work shift work.  She does feel better but is not 100%.  Getting a job during the daytime is not an option  2. Shift work sleep disorder We will adjust the patient's Provigil accordingly.  Asked her to take it a little bit later so that she can stay awake throughout the night without having excessive inability to fall asleep during the next day  General Counseling: I have discussed the findings of the evaluation and examination with 03/13/2015.  I have also discussed any further  diagnostic evaluation thatmay be needed or ordered today. Irisha verbalizes understanding of the findings of todays visit. We also reviewed her medications today and discussed drug interactions and side effects including but not limited excessive drowsiness and altered mental states. We also discussed that there is always a risk not just to her but also people around her. she has been encouraged to call the office with any questions or concerns that should arise related to todays visit.  No orders of the defined types were placed in this encounter.    Time spent: 74  I have personally obtained a history, examined the patient, evaluated laboratory and imaging results, formulated the assessment and plan and placed orders.    Yevonne Pax, MD [facility] Pulmonary and Critical Care Sleep medicine

## 2021-10-30 ENCOUNTER — Other Ambulatory Visit: Payer: Self-pay | Admitting: Physician Assistant

## 2021-10-30 DIAGNOSIS — G4726 Circadian rhythm sleep disorder, shift work type: Secondary | ICD-10-CM

## 2021-11-29 ENCOUNTER — Other Ambulatory Visit: Payer: Self-pay | Admitting: Physician Assistant

## 2021-11-29 DIAGNOSIS — K219 Gastro-esophageal reflux disease without esophagitis: Secondary | ICD-10-CM

## 2021-12-23 ENCOUNTER — Ambulatory Visit (INDEPENDENT_AMBULATORY_CARE_PROVIDER_SITE_OTHER): Payer: Commercial Managed Care - PPO | Admitting: Psychiatry

## 2021-12-23 ENCOUNTER — Encounter: Payer: Self-pay | Admitting: Psychiatry

## 2021-12-23 VITALS — BP 121/89 | HR 114 | Temp 98.5°F | Wt 184.6 lb

## 2021-12-23 DIAGNOSIS — G4701 Insomnia due to medical condition: Secondary | ICD-10-CM | POA: Diagnosis not present

## 2021-12-23 DIAGNOSIS — F17200 Nicotine dependence, unspecified, uncomplicated: Secondary | ICD-10-CM

## 2021-12-23 DIAGNOSIS — F439 Reaction to severe stress, unspecified: Secondary | ICD-10-CM

## 2021-12-23 DIAGNOSIS — F411 Generalized anxiety disorder: Secondary | ICD-10-CM | POA: Diagnosis not present

## 2021-12-23 DIAGNOSIS — F4010 Social phobia, unspecified: Secondary | ICD-10-CM | POA: Diagnosis not present

## 2021-12-23 MED ORDER — VENLAFAXINE HCL ER 37.5 MG PO CP24: 37.5000 mg | ORAL_CAPSULE | Freq: Every day | ORAL | 0 refills | Status: DC
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
## 2022-01-01 ENCOUNTER — Telehealth: Payer: Self-pay

## 2022-01-01 NOTE — Telephone Encounter (Signed)
Left vm notifying patient of 01/09/22 appt date change-Toni

## 2022-01-08 ENCOUNTER — Encounter: Payer: Self-pay | Admitting: Physician Assistant

## 2022-01-09 ENCOUNTER — Encounter: Payer: Commercial Managed Care - PPO | Admitting: Physician Assistant

## 2022-01-14 ENCOUNTER — Ambulatory Visit: Payer: Commercial Managed Care - PPO | Admitting: Internal Medicine

## 2022-01-14 ENCOUNTER — Encounter: Payer: Self-pay | Admitting: Nurse Practitioner

## 2022-01-14 VITALS — BP 91/77 | HR 81 | Temp 98.2°F | Resp 16 | Ht 63.0 in | Wt 180.8 lb

## 2022-01-14 DIAGNOSIS — K047 Periapical abscess without sinus: Secondary | ICD-10-CM | POA: Diagnosis not present

## 2022-01-14 DIAGNOSIS — R519 Headache, unspecified: Secondary | ICD-10-CM

## 2022-01-14 MED ORDER — AMOXICILLIN 500 MG PO CAPS: 500.0000 mg | ORAL_CAPSULE | Freq: Three times a day (TID) | ORAL | 0 refills | Status: AC

## 2022-01-14 NOTE — Progress Notes (Signed)
[facility] [address] [place], [place] [address]  Internal MEDICINE  Office Visit Note  Patient Name: Savannah [name]  [id number]  [id number]  Date of Service: 01/19/2022  Chief Complaint  Patient presents with   Dental Pain    Facial swelling    HPI  Pt is seen for acute visit - has tooth abscess and cavity right upper, needs root canal however cannot afford it - referred pain, looks uncomfortable - denies any fever or chills    Current Medication: Outpatient Encounter Medications as of 01/14/2022  Medication Sig   amoxicillin (AMOXIL) 500 MG capsule Take 1 capsule (500 mg total) by mouth 3 (three) times daily for 10 days.   levonorgestrel (MIRENA) 20 MCG/24HR IUD 1 each once by Intrauterine route.   modafinil (PROVIGIL) 200 MG tablet TAKE 1 TABLET BY MOUTH EVERY DAY   omeprazole (PRILOSEC) 40 MG capsule TAKE 1 CAPSULE (40 MG TOTAL) BY MOUTH DAILY.   venlafaxine XR (EFFEXOR-XR) 37.5 MG 24 hr capsule Take 1 capsule (37.5 mg total) by mouth daily with breakfast.   No facility-administered encounter medications on file as of 01/14/2022.    Surgical History: Past Surgical History:  Procedure Laterality Date   CARPAL TUNNEL RELEASE Right 09/29/2014   Procedure: CARPAL TUNNEL RELEASE;  Surgeon: Myra Rude, MD;  Location: [facility] ORS;  Service: Orthopedics;  Laterality: Right;   CESAREAN SECTION     IUD placement     MYRINGOTOMY WITH TUBE PLACEMENT      Medical History: Past Medical History:  Diagnosis Date   Anxiety    Depression    Eardrum rupture, left    Headache     Family History: Family History  Problem Relation Age of Onset   Healthy Mother    Bone cancer Maternal Grandfather    Mental illness Neg Hx     Social History   Socioeconomic History   Marital status: Married    Spouse name: katherine   Number of children: 3   Years of education: Not on file   Highest education level: High school graduate  Occupational History   Not on  file  Tobacco Use   Smoking status: Some Days    Packs/day: 0.25    Types: Cigarettes    Last attempt to quit: 06/14/2018    Years since quitting: 3.6    Passive exposure: Current   Smokeless tobacco: Never  Vaping Use   Vaping Use: Never used  Substance and Sexual Activity   Alcohol use: Not Currently   Drug use: No   Sexual activity: Yes    Birth control/protection: I.U.D.  Other Topics Concern   Not on file  Social History Narrative   Not on file   Social Determinants of Health   Financial Resource Strain: Not on file  Food Insecurity: Not on file  Transportation Needs: Not on file  Physical Activity: Inactive (03/26/2017)   Exercise Vital Sign    Days of Exercise per Week: 0 days    Minutes of Exercise per Session: 0 min  Stress: Stress Concern Present (03/26/2017)   [employer] of Occupational Health - Occupational Stress Questionnaire    Feeling of Stress : To some extent  Social Connections: Somewhat Isolated (03/26/2017)   Social Connection and Isolation Panel [NHANES]    Frequency of Communication with Friends and Family: Three times a week    Frequency of Social Gatherings with Friends and Family: Once a week    Attends Religious Services: Never  Active Member of Clubs or Organizations: No    Attends Banker Meetings: Never    Marital Status: Living with partner  Intimate Partner Violence: Not At Risk (03/26/2017)   Humiliation, Afraid, Rape, and Kick questionnaire    Fear of Current or Ex-Partner: No    Emotionally Abused: No    Physically Abused: No    Sexually Abused: No      Review of Systems  Constitutional:  Negative for chills, fatigue and fever.  HENT:  Positive for dental problem and facial swelling. Negative for congestion, mouth sores and postnasal drip.   Respiratory:  Negative for cough.   Cardiovascular:  Negative for chest pain.  Genitourinary:  Negative for flank pain.  Psychiatric/Behavioral: Negative.       Vital Signs: BP 91/77   Pulse 81   Temp 98.2 F (36.8 C)   Resp 16   Ht 5\' 3"  (1.6 m)   Wt 180 lb 12.8 oz (82 kg)   SpO2 99%   BMI 32.03 kg/m    Physical Exam HENT:     Mouth/Throat:     Dentition: Abnormal dentition. Dental tenderness, dental caries and dental abscesses present.      Comments: Poor dental hygiene       Assessment/Plan: 1. Tooth abscess Treat for now, will need o see dental for further care - amoxicillin (AMOXIL) 500 MG capsule; Take 1 capsule (500 mg total) by mouth 3 (three) times daily for 10 days.  Dispense: 30 capsule; Refill: 0 - Ambulatory referral to Dentistry  2. Referred facial pain Continue ALEVE or Motrin otc 400 mg po tid with food  - Ambulatory referral to Dentistry   General Counseling: understanding of the findings of todays visit and agrees with plan of treatment. I have discussed any further diagnostic evaluation that may be needed or ordered today. We also reviewed her medications today. she has been encouraged to call the office with any questions or concerns that should arise related to todays visit.    Orders Placed This Encounter  Procedures   Ambulatory referral to Dentistry    Meds ordered this encounter  Medications   amoxicillin (AMOXIL) 500 MG capsule    Sig: Take 1 capsule (500 mg total) by mouth 3 (three) times daily for 10 days.    Dispense:  30 capsule    Refill:  0    Total time spent:25 Minutes Time spent includes review of chart, medications, test results, and follow up plan with the patient.   [place] Controlled Substance Database was reviewed by me.   Dr Kathie Dike Internal medicine

## 2022-02-03 ENCOUNTER — Telehealth: Payer: Self-pay | Admitting: Physician Assistant

## 2022-02-03 ENCOUNTER — Encounter: Payer: Self-pay | Admitting: Physician Assistant

## 2022-02-03 NOTE — Telephone Encounter (Signed)
Left vm to confirm 02/07/22 appointment-Toni 

## 2022-02-04 ENCOUNTER — Other Ambulatory Visit: Payer: Self-pay | Admitting: Physician Assistant

## 2022-02-04 DIAGNOSIS — K219 Gastro-esophageal reflux disease without esophagitis: Secondary | ICD-10-CM

## 2022-02-07 ENCOUNTER — Other Ambulatory Visit: Payer: Self-pay | Admitting: Physician Assistant

## 2022-02-07 ENCOUNTER — Encounter: Payer: Self-pay | Admitting: Physician Assistant

## 2022-02-07 ENCOUNTER — Ambulatory Visit (INDEPENDENT_AMBULATORY_CARE_PROVIDER_SITE_OTHER): Payer: Commercial Managed Care - PPO | Admitting: Physician Assistant

## 2022-02-07 VITALS — BP 114/73 | HR 96 | Temp 98.4°F | Ht 63.0 in | Wt 177.0 lb

## 2022-02-07 DIAGNOSIS — F411 Generalized anxiety disorder: Secondary | ICD-10-CM

## 2022-02-07 DIAGNOSIS — G8929 Other chronic pain: Secondary | ICD-10-CM

## 2022-02-07 DIAGNOSIS — Z0001 Encounter for general adult medical examination with abnormal findings: Secondary | ICD-10-CM | POA: Diagnosis not present

## 2022-02-07 DIAGNOSIS — E559 Vitamin D deficiency, unspecified: Secondary | ICD-10-CM

## 2022-02-07 DIAGNOSIS — E782 Mixed hyperlipidemia: Secondary | ICD-10-CM

## 2022-02-07 DIAGNOSIS — G4726 Circadian rhythm sleep disorder, shift work type: Secondary | ICD-10-CM

## 2022-02-07 DIAGNOSIS — M545 Low back pain, unspecified: Secondary | ICD-10-CM | POA: Diagnosis not present

## 2022-02-07 DIAGNOSIS — R300 Dysuria: Secondary | ICD-10-CM | POA: Diagnosis not present

## 2022-02-07 DIAGNOSIS — F339 Major depressive disorder, recurrent, unspecified: Secondary | ICD-10-CM

## 2022-02-07 DIAGNOSIS — E538 Deficiency of other specified B group vitamins: Secondary | ICD-10-CM

## 2022-02-07 DIAGNOSIS — Z01419 Encounter for gynecological examination (general) (routine) without abnormal findings: Secondary | ICD-10-CM
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
## 2022-02-08 LAB — UA/M W/RFLX CULTURE, ROUTINE
Bilirubin, UA: NEGATIVE
Glucose, UA: NEGATIVE
Ketones, UA: NEGATIVE
Leukocytes,UA: NEGATIVE
Nitrite, UA: NEGATIVE
Protein,UA: NEGATIVE
RBC, UA: NEGATIVE
Specific Gravity, UA: >=1.03 — AB (ref 1.005–1.030)
Urobilinogen, Ur: 0.2 mg/dL (ref 0.2–1.0)
pH, UA: 5.5 (ref 5.0–7.5)

## 2022-02-08 LAB — MICROSCOPIC EXAMINATION
Casts: NONE SEEN /lpf
RBC, Urine: NONE SEEN /hpf (ref 0–2)
WBC, UA: NONE SEEN /hpf (ref 0–5)

## 2022-02-22 LAB — COMPREHENSIVE METABOLIC PANEL
ALT: 16 IU/L (ref 0–32)
AST: 15 IU/L (ref 0–40)
Albumin/Globulin Ratio: 2.1 (ref 1.2–2.2)
Albumin: 4.6 g/dL (ref 3.9–4.9)
Alkaline Phosphatase: 69 IU/L (ref 44–121)
BUN/Creatinine Ratio: 14 (ref 9–23)
BUN: 12 mg/dL (ref 6–20)
Bilirubin Total: 0.4 mg/dL (ref 0.0–1.2)
CO2: 22 mmol/L (ref 20–29)
Calcium: 9.5 mg/dL (ref 8.7–10.2)
Chloride: 100 mmol/L (ref 96–106)
Creatinine, Ser: 0.83 mg/dL (ref 0.57–1.00)
Globulin, Total: 2.2 g/dL (ref 1.5–4.5)
Glucose: 78 mg/dL (ref 70–99)
Potassium: 4.1 mmol/L (ref 3.5–5.2)
Sodium: 137 mmol/L (ref 134–144)
Total Protein: 6.8 g/dL (ref 6.0–8.5)
eGFR: 95 mL/min/{1.73_m2} (ref >59)

## 2022-02-22 LAB — CBC WITH DIFFERENTIAL/PLATELET
Basophils Absolute: 0.1 10*3/uL (ref 0.0–0.2)
Basos: 1 %
EOS (ABSOLUTE): 0 10*3/uL (ref 0.0–0.4)
Eos: 0 %
Hematocrit: 44 % (ref 34.0–46.6)
Hemoglobin: 14.9 g/dL (ref 11.1–15.9)
Immature Grans (Abs): 0 10*3/uL (ref 0.0–0.1)
Immature Granulocytes: 1 %
Lymphocytes Absolute: 3.3 10*3/uL — ABNORMAL HIGH (ref 0.7–3.1)
Lymphs: 38 %
MCH: 30.7 pg (ref 26.6–33.0)
MCHC: 33.9 g/dL (ref 31.5–35.7)
MCV: 91 fL (ref 79–97)
Monocytes Absolute: 0.6 10*3/uL (ref 0.1–0.9)
Monocytes: 7 %
Neutrophils Absolute: 4.6 10*3/uL (ref 1.4–7.0)
Neutrophils: 53 %
Platelets: 310 10*3/uL (ref 150–450)
RBC: 4.85 x10E6/uL (ref 3.77–5.28)
RDW: 12.7 % (ref 11.7–15.4)
WBC: 8.6 10*3/uL (ref 3.4–10.8)

## 2022-02-22 LAB — VITAMIN D 25 HYDROXY (VIT D DEFICIENCY, FRACTURES): Vit D, 25-Hydroxy: 18.9 ng/mL — ABNORMAL LOW (ref 30.0–100.0)

## 2022-02-22 LAB — LIPID PANEL WITH LDL/HDL RATIO
Cholesterol, Total: 200 mg/dL — ABNORMAL HIGH (ref 100–199)
HDL: 29 mg/dL — ABNORMAL LOW (ref >39)
LDL Chol Calc (NIH): 124 mg/dL — ABNORMAL HIGH (ref 0–99)
LDL/HDL Ratio: 4.3 ratio — ABNORMAL HIGH (ref 0.0–3.2)
Triglycerides: 263 mg/dL — ABNORMAL HIGH (ref 0–149)
VLDL Cholesterol Cal: 47 mg/dL — ABNORMAL HIGH (ref 5–40)

## 2022-02-22 LAB — B12 AND FOLATE PANEL
Folate: 11.2 ng/mL (ref >3.0)
Vitamin B-12: 502 pg/mL (ref 232–1245)

## 2022-02-22 LAB — TSH+FREE T4
Free T4: 1 ng/dL (ref 0.82–1.77)
TSH: 2.67 u[IU]/mL (ref 0.450–4.500)

## 2022-02-25 ENCOUNTER — Telehealth: Payer: Self-pay

## 2022-02-25 ENCOUNTER — Other Ambulatory Visit: Payer: Self-pay

## 2022-02-25 DIAGNOSIS — F439 Reaction to severe stress, unspecified: Secondary | ICD-10-CM

## 2022-02-25 DIAGNOSIS — F411 Generalized anxiety disorder: Secondary | ICD-10-CM

## 2022-02-25 DIAGNOSIS — F4010 Social phobia, unspecified: Secondary | ICD-10-CM

## 2022-02-25 MED ORDER — ERGOCALCIFEROL 1.25 MG (50000 UT) PO CAPS: 50000.0000 [IU] | ORAL_CAPSULE | ORAL | 3 refills | Status: DC

## 2022-02-25 MED ORDER — ROSUVASTATIN CALCIUM 5 MG PO TABS: 5.0000 mg | ORAL_TABLET | Freq: Every day | ORAL | 3 refills | Status: DC

## 2022-02-25 NOTE — Telephone Encounter (Signed)
-----   Message from Lauren K McDonough, PA-C sent at 02/25/2022  9:42 AM EDT ----- Please let her know that her labs showed worsening cholesterol and to avoid fried/fatty foods. Consider fish oil supplement and also consider starting cholesterol medication: crestor 5mg nightly for 30 tabs with 3 refills. Send if pt ok with it. Also her vit D is low, please send drisdol (50,000units) weekly x4 months. 

## 2022-02-25 NOTE — Telephone Encounter (Signed)
Tried to reach patient to review lab results, I left a message asking for her to please call back.

## 2022-02-25 NOTE — Telephone Encounter (Addendum)
Pt notified by Mervin Kung and I sent both prescription

## 2022-02-25 NOTE — Telephone Encounter (Signed)
-----   Message from Mylinda Latina, PA-C sent at 02/25/2022  9:42 AM EDT ----- Please let her know that her labs showed worsening cholesterol and to avoid fried/fatty foods. Consider fish oil supplement and also consider starting cholesterol medication: crestor 5mg  nightly for 30 tabs with 3 refills. Send if pt ok with it. Also her vit D is low, please send drisdol (50,000units) weekly x4 months.

## 2022-03-14 ENCOUNTER — Other Ambulatory Visit: Payer: Self-pay | Admitting: Psychiatry

## 2022-03-14 DIAGNOSIS — F4010 Social phobia, unspecified: Secondary | ICD-10-CM

## 2022-03-14 DIAGNOSIS — F439 Reaction to severe stress, unspecified: Secondary | ICD-10-CM

## 2022-03-14 DIAGNOSIS — F411 Generalized anxiety disorder: Secondary | ICD-10-CM

## 2022-03-24 ENCOUNTER — Encounter: Payer: Self-pay | Admitting: Psychiatry

## 2022-03-24 ENCOUNTER — Ambulatory Visit (INDEPENDENT_AMBULATORY_CARE_PROVIDER_SITE_OTHER): Payer: Commercial Managed Care - PPO | Admitting: Psychiatry

## 2022-03-24 VITALS — BP 114/79 | HR 78 | Temp 98.2°F | Ht 63.0 in | Wt 178.2 lb

## 2022-03-24 DIAGNOSIS — F17200 Nicotine dependence, unspecified, uncomplicated: Secondary | ICD-10-CM

## 2022-03-24 DIAGNOSIS — G4701 Insomnia due to medical condition: Secondary | ICD-10-CM

## 2022-03-24 DIAGNOSIS — F4010 Social phobia, unspecified: Secondary | ICD-10-CM

## 2022-03-24 DIAGNOSIS — F411 Generalized anxiety disorder: Secondary | ICD-10-CM

## 2022-03-24 NOTE — Progress Notes (Signed)
[facility] MD OP Progress Note  03/24/2022 9:59 AM Savannah [name]  MRN:  [id number]  Chief Complaint:  Chief Complaint  Patient presents with   Follow-up   Medication Refill   Depression   Anxiety   HPI: Savannah [name] is a 36 year old Caucasian female, married, lives in [place], employed, has a history of GAD, social anxiety disorder, insomnia, tobacco use disorder, vitamin D deficiency ( recently diagnosed), hyperlipidemia was evaluated in office today.  Patient today reports she was recently diagnosed with vitamin D deficiency, currently on vitamin D replacement.  Patient reports she has been doing well otherwise with regards to her mood.  Denies any significant anxiety or depressive symptoms.  Denies any trauma related symptoms.  Reports she continues to work night shift and sleeps during the day.  Sleep can be restless at times however overall she has been sleeping well.  Patient denies any suicidality, homicidality or perceptual disturbances.  Patient reports she is compliant on the venlafaxine, denies side effects.  Planning to take a trip to [facility] during Thanksgiving.  Denies any other concerns today.  Visit Diagnosis:    ICD-10-CM   1. GAD (generalized anxiety disorder)  F41.1     2. Social anxiety disorder  F40.10     3. Insomnia due to medical condition  G47.01    Mood, sleep hygiene    4. Tobacco use disorder  F17.200       Past Psychiatric History: Reviewed past psychiatric history from progress note on 06/04/2021.  Past trials of sertraline-headaches, lorazepam-side effects, Klonopin-side effects, melatonin-did not work, Cymbalta-side effects.  Past Medical History:  Past Medical History:  Diagnosis Date   Anxiety    Depression    Eardrum rupture, left    Headache     Past Surgical History:  Procedure Laterality Date   CARPAL TUNNEL RELEASE Right 09/29/2014   Procedure: CARPAL TUNNEL RELEASE;  Surgeon: Myra Rude, MD;  Location: [facility]  [facility];  Service: Orthopedics;  Laterality: Right;   CESAREAN SECTION     IUD placement     MYRINGOTOMY WITH TUBE PLACEMENT      Family Psychiatric History: Reviewed family psychiatric history from progress note on 06/04/2021.  Family History:  Family History  Problem Relation Age of Onset   Healthy Mother    Bone cancer Maternal Grandfather    Mental illness Neg Hx     Social History: Reviewed social history from progress note on 06/04/2021. Social History   Socioeconomic History   Marital status: Married    Spouse name: katherine   Number of children: 3   Years of education: Not on file   Highest education level: High school graduate  Occupational History   Not on file  Tobacco Use   Smoking status: Some Days    Packs/day: 0.25    Types: Cigarettes    Last attempt to quit: 06/14/2018    Years since quitting: 3.7    Passive exposure: Current   Smokeless tobacco: Never  Vaping Use   Vaping Use: Never used  Substance and Sexual Activity   Alcohol use: Not Currently   Drug use: No   Sexual activity: Yes    Birth control/protection: I.U.D.  Other Topics Concern   Not on file  Social History Narrative   Not on file   Social Determinants of Health   Financial Resource Strain: Not on file  Food Insecurity: Not on file  Transportation Needs: Not on file  Physical Activity: Inactive (03/26/2017)   Exercise Vital  Sign    Days of Exercise per Week: 0 days    Minutes of Exercise per Session: 0 min  Stress: Stress Concern Present (03/26/2017)   [employer] of Occupational Health - Occupational Stress Questionnaire    Feeling of Stress : To some extent  Social Connections: Somewhat Isolated (03/26/2017)   Social Connection and Isolation Panel [NHANES]    Frequency of Communication with Friends and Family: Three times a week    Frequency of Social Gatherings with Friends and Family: Once a week    Attends Religious Services: Never    Database administrator or  Organizations: No    Attends Banker Meetings: Never    Marital Status: Living with partner    Allergies:  Allergies  Allergen Reactions   Ibuprofen Other (See Comments)    Burns stomach    Metabolic Disorder Labs: No results found for: "HGBA1C", "MPG" No results found for: "PROLACTIN" Lab Results  Component Value Date   CHOL 200 (H) 02/21/2022   TRIG 263 (H) 02/21/2022   HDL 29 (L) 02/21/2022   LDLCALC 124 (H) 02/21/2022   LDLCALC 145 (H) 01/25/2021   Lab Results  Component Value Date   TSH 2.670 02/21/2022   TSH 2.120 01/25/2021    Therapeutic Level Labs: No results found for: "LITHIUM" No results found for: "VALPROATE" No results found for: "CBMZ"  Current Medications: Current Outpatient Medications  Medication Sig Dispense Refill   cyclobenzaprine (FLEXERIL) 10 MG tablet Take 10 mg by mouth 3 (three) times daily.     ergocalciferol (VITAMIN D2) 1.25 MG (50000 UT) capsule Take 1 capsule (50,000 Units total) by mouth once a week. 4 capsule 3   levonorgestrel (MIRENA) 20 MCG/24HR IUD 1 each once by Intrauterine route.     modafinil (PROVIGIL) 200 MG tablet TAKE 1 TABLET BY MOUTH EVERY DAY 30 tablet 2   omeprazole (PRILOSEC) 40 MG capsule TAKE 1 CAPSULE (40 MG TOTAL) BY MOUTH DAILY. 30 capsule 1   rosuvastatin (CRESTOR) 5 MG tablet Take 1 tablet (5 mg total) by mouth daily. 30 tablet 3   venlafaxine XR (EFFEXOR-XR) 37.5 MG 24 hr capsule TAKE 1 CAPSULE BY MOUTH DAILY WITH BREAKFAST. 90 capsule 0   No current facility-administered medications for this visit.     Musculoskeletal: Strength & Muscle Tone: within normal limits Gait & Station: normal Patient leans: N/A  Psychiatric Specialty Exam: Review of Systems  Musculoskeletal:        BL hip joint pain - chronic  Psychiatric/Behavioral: Negative.    All other systems reviewed and are negative.   Blood pressure 114/79, pulse 78, temperature 98.2 F (36.8 C), temperature source Oral, height 5\' 3"   (1.6 m), weight 178 lb 3.2 oz (80.8 kg), SpO2 98 %.Body mass index is 31.57 kg/m.  General Appearance: Casual  Eye Contact:  Fair  Speech:  Clear and Coherent  Volume:  Normal  Mood:  Euthymic  Affect:  Congruent  Thought Process:  Goal Directed and Descriptions of Associations: Intact  Orientation:  Full (Time, Place, and Person)  Thought Content: Logical   Suicidal Thoughts:  No  Homicidal Thoughts:  No  Memory:  Immediate;   Fair Recent;   Fair Remote;   Fair  Judgement:  Fair  Insight:  Fair  Psychomotor Activity:  Normal  Concentration:  Concentration: Fair and Attention Span: Fair  Recall:  of Knowledge: Fair  Language: Fair  Akathisia:  No  Handed:  Right  AIMS (  if indicated): not done  Assets:  Communication Skills Desire for Improvement Resilience Social Support Talents/Skills Transportation  ADL's:  Intact  Cognition: WNL  Sleep:  Fair   Screenings: AIMS    Flowsheet Row Office Visit from 12/23/2021 in [facility] Office Visit from 10/18/2021 in [facility] Office Visit from 09/05/2021 in [facility]  AIMS Total Score 0 0 0      GAD-7    Flowsheet Row Office Visit from 03/24/2022 in [facility] Office Visit from 12/23/2021 in [facility] Office Visit from 10/18/2021 in [facility] Office Visit from 09/05/2021 in [facility] Counselor from 07/18/2021 in [facility]  Total GAD-7 Score 3 12 4 8 18       PHQ2-9    Flowsheet Row Office Visit from 03/24/2022 in [facility] Office Visit from 12/23/2021 in [facility] Office Visit from 10/18/2021 in [facility] Office Visit from 09/05/2021 in [facility] Counselor from  07/18/2021 in [facility]  PHQ-2 Total Score 1 2 2 1 3   PHQ-9 Total Score -- 6 6 6 12       Flowsheet Row Office Visit from 03/24/2022 in [facility] Office Visit from 12/23/2021 in [facility] Counselor from 10/23/2021 in [facility]  C-SSRS RISK CATEGORY No Risk No Risk No Risk        Assessment and Plan: Savannah [name] is a 36 year old Caucasian female, married, lives in [place], has a history of GAD, social anxiety disorder, insomnia, hyperlipidemia, vitamin D deficiency was evaluated in office today.  Patient is currently stable.  Plan as noted below.  Plan GAD-stable Venlafaxine extended release 37.5 mg p.o. daily with breakfast Continue CBT with Ms. Christina Hussami as needed  Social anxiety disorder-improving Continue CBT  Insomnia-stable Modafinil as prescribed by her provider Had multiple sleep studies in the past-CPAP was not recommended. Discussed sleep hygiene.  Tobacco use disorder-improving Will monitor closely.  Follow-up in clinic in 3 months or sooner if needed.  This note was generated in part or whole with voice recognition software. Voice recognition is usually quite accurate but there are transcription errors that can and very often do occur. I apologize for any typographical errors that were not detected and corrected.      Randel Books, MD 03/24/2022, 9:59 AM

## 2022-04-09 ENCOUNTER — Other Ambulatory Visit: Payer: Self-pay | Admitting: Internal Medicine

## 2022-04-09 DIAGNOSIS — K219 Gastro-esophageal reflux disease without esophagitis: Secondary | ICD-10-CM

## 2022-04-11 ENCOUNTER — Telehealth: Payer: Self-pay

## 2022-04-11 NOTE — Telephone Encounter (Signed)
Pa was send for Modafinil.

## 2022-06-08 ENCOUNTER — Other Ambulatory Visit: Payer: Self-pay | Admitting: Physician Assistant

## 2022-06-08 DIAGNOSIS — K219 Gastro-esophageal reflux disease without esophagitis: Secondary | ICD-10-CM

## 2022-06-13 ENCOUNTER — Telehealth: Payer: Self-pay | Admitting: Physician Assistant

## 2022-06-13 ENCOUNTER — Ambulatory Visit: Payer: Commercial Managed Care - PPO | Admitting: Physician Assistant

## 2022-06-13 ENCOUNTER — Encounter: Payer: Self-pay | Admitting: Physician Assistant

## 2022-06-13 VITALS — BP 136/78 | HR 92 | Temp 98.4°F | Resp 16 | Ht 63.0 in | Wt 181.6 lb

## 2022-06-13 DIAGNOSIS — L409 Psoriasis, unspecified: Secondary | ICD-10-CM

## 2022-06-13 DIAGNOSIS — H9212 Otorrhea, left ear: Secondary | ICD-10-CM | POA: Diagnosis not present

## 2022-06-13 DIAGNOSIS — H93232 Hyperacusis, left ear: Secondary | ICD-10-CM

## 2022-06-13 MED ORDER — AMOXICILLIN-POT CLAVULANATE 875-125 MG PO TABS: 1.0000 | ORAL_TABLET | Freq: Two times a day (BID) | ORAL | 0 refills | Status: DC

## 2022-06-13 NOTE — Telephone Encounter (Signed)
Awaiting 06/13/22 office notes for Dermatology referral-Toni

## 2022-06-13 NOTE — Telephone Encounter (Signed)
Awaiting 06/13/22 office notes for Otolaryngology referral-Toni

## 2022-06-13 NOTE — Progress Notes (Signed)
[facility] [place], [place] [address]  Internal MEDICINE  Office Visit Note  Patient Name: Savannah [name]  [id number]  [id number]  Date of Service: 06/13/2022  Chief Complaint  Patient presents with   Acute Visit    Possible ear infection. Left ear feels foggy and full, no pain. Ear has been draining.     HPI Pt is here for a sick visit. -Draining from left ear about 2 weeks ago, clear fluid. No pain and no color to fluid. After about a week changed to yellowish color and has gotten darker. Difficult to hear/muffled since change in color. Still no pain -a little sinus drainage and congestion but not much and started after ear. -Does mention hx of rupturing same eardrum as a child and was very painful then, unlike now -She also mentions that her left ear has always been difficult to visualize and was told the canal on the left is much narrowing and difficult to see. She has never been successful with ear drops in this ear either as they don't work well and often just are painful -This is the case during exam today. Unable to visualize TM on the left and given this will avoid any ear lavage as it may make things worse. Possible wax build up after water entered ear to explain drainage and muffled hearing without pain.  -Possible there is some infection despite no pain and will go ahead and treat and refer to ENT for further examination -Also having worsening flare of her psoriasis, especially along Right ankle/heel with large area of psoriasis. States it is not very painful now but was bothersome. She has some topical left over from dermatology that she is starting to use now. Interested in new dermatology referral. Last dermatologist had her on an injection that made her feel sick and was concerned about lowering her immune system and therefore did not follow back up. Will refer to new location.  Current Medication:  Outpatient Encounter Medications as of  06/13/2022  Medication Sig   amoxicillin-clavulanate (AUGMENTIN) 875-125 MG tablet Take 1 tablet by mouth 2 (two) times daily. Take with food.   cyclobenzaprine (FLEXERIL) 10 MG tablet Take 10 mg by mouth 3 (three) times daily.   ergocalciferol (VITAMIN D2) 1.25 MG (50000 UT) capsule Take 1 capsule (50,000 Units total) by mouth once a week.   levonorgestrel (MIRENA) 20 MCG/24HR IUD 1 each once by Intrauterine route.   modafinil (PROVIGIL) 200 MG tablet TAKE 1 TABLET BY MOUTH EVERY DAY   omeprazole (PRILOSEC) 40 MG capsule TAKE 1 CAPSULE (40 MG TOTAL) BY MOUTH DAILY.   rosuvastatin (CRESTOR) 5 MG tablet Take 1 tablet (5 mg total) by mouth daily.   venlafaxine XR (EFFEXOR-XR) 37.5 MG 24 hr capsule TAKE 1 CAPSULE BY MOUTH DAILY WITH BREAKFAST.   No facility-administered encounter medications on file as of 06/13/2022.      Medical History: Past Medical History:  Diagnosis Date   Anxiety    Depression    Eardrum rupture, left    Headache      Vital Signs: BP 136/78   Pulse 92   Temp 98.4 F (36.9 C)   Resp 16   Ht 5\' 3"  (1.6 m)   Wt 181 lb 9.6 oz (82.4 kg)   SpO2 98%   BMI 32.17 kg/m    Review of Systems  Constitutional:  Negative for fatigue and fever.  HENT:  Positive for ear discharge, hearing loss, postnasal drip and sinus pressure.  Negative for mouth sores.   Respiratory:  Negative for cough.   Cardiovascular:  Negative for chest pain.  Genitourinary:  Negative for flank pain.  Skin:  Positive for rash.  Psychiatric/Behavioral: Negative.      Physical Exam Vitals and nursing note reviewed.  Constitutional:      General: She is not in acute distress.    Appearance: She is well-developed. She is not diaphoretic.  HENT:     Head: Normocephalic and atraumatic.     Ears:     Comments: Unable to visualize left TM or much of canal, no erythema visualized at exterior of canal    Mouth/Throat:     Pharynx: No oropharyngeal exudate.  Eyes:     Pupils: Pupils are equal,  round, and reactive to light.  Neck:     Thyroid: No thyromegaly.     Vascular: No JVD.     Trachea: No tracheal deviation.  Cardiovascular:     Rate and Rhythm: Normal rate and regular rhythm.     Heart sounds: Normal heart sounds. No murmur heard.    No friction rub. No gallop.  Pulmonary:     Effort: Pulmonary effort is normal. No respiratory distress.     Breath sounds: No wheezing or rales.  Chest:     Chest wall: No tenderness.  Abdominal:     General: Bowel sounds are normal.     Palpations: Abdomen is soft.  Musculoskeletal:        General: Normal range of motion.     Cervical back: Normal range of motion and neck supple.  Lymphadenopathy:     Cervical: No cervical adenopathy.  Skin:    General: Skin is warm and dry.  Neurological:     Mental Status: She is alert and oriented to person, place, and time.     Cranial Nerves: No cranial nerve deficit.  Psychiatric:        Behavior: Behavior normal.        Thought Content: Thought content normal.        Judgment: Judgment normal.       Assessment/Plan: 1. Hearing abnormally acute, left Will go ahead and treat with course of augmentin as eardrops are very difficult for her and given no pain present. Will go ahead and refer to ENT for further evaluation and treatment, may need cleaning. Advised to call if new or worsening symptoms - amoxicillin-clavulanate (AUGMENTIN) 875-125 MG tablet; Take 1 tablet by mouth 2 (two) times daily. Take with food.  Dispense: 20 tablet; Refill: 0 - Ambulatory referral to ENT  2. Otorrhea of left ear - amoxicillin-clavulanate (AUGMENTIN) 875-125 MG tablet; Take 1 tablet by mouth 2 (two) times daily. Take with food.  Dispense: 20 tablet; Refill: 0 - Ambulatory referral to ENT  3. Psoriasis - Ambulatory referral to Dermatology   General Counseling: weslyn holsonback understanding of the findings of todays visit and agrees with plan of treatment. I have discussed any further diagnostic  evaluation that may be needed or ordered today. We also reviewed her medications today. she has been encouraged to call the office with any questions or concerns that should arise related to todays visit.    Counseling:    Orders Placed This Encounter  Procedures   Ambulatory referral to ENT   Ambulatory referral to Dermatology    Meds ordered this encounter  Medications   amoxicillin-clavulanate (AUGMENTIN) 875-125 MG tablet    Sig: Take 1 tablet by mouth 2 (two) times daily. Take  with food.    Dispense:  20 tablet    Refill:  0    Time spent:30 Minutes

## 2022-06-18 ENCOUNTER — Telehealth: Payer: Self-pay | Admitting: Physician Assistant

## 2022-06-18 ENCOUNTER — Other Ambulatory Visit: Payer: Self-pay | Admitting: Psychiatry

## 2022-06-18 DIAGNOSIS — F439 Reaction to severe stress, unspecified: Secondary | ICD-10-CM

## 2022-06-18 DIAGNOSIS — F411 Generalized anxiety disorder: Secondary | ICD-10-CM

## 2022-06-18 DIAGNOSIS — F4010 Social phobia, unspecified: Secondary | ICD-10-CM

## 2022-06-18 NOTE — Telephone Encounter (Signed)
Dermatology referral sent via [facility] to [facility] 

## 2022-06-18 NOTE — Telephone Encounter (Signed)
Otolaryngology referral sent via [facility] to [facility] 

## 2022-06-20 ENCOUNTER — Telehealth: Payer: Self-pay | Admitting: Physician Assistant

## 2022-06-20 NOTE — Telephone Encounter (Signed)
Dermatology appointment>> 06/17/22 with  [facility]

## 2022-06-21 ENCOUNTER — Other Ambulatory Visit: Payer: Self-pay | Admitting: Physician Assistant

## 2022-06-23 ENCOUNTER — Encounter: Payer: Self-pay | Admitting: Psychiatry

## 2022-06-23 ENCOUNTER — Ambulatory Visit (INDEPENDENT_AMBULATORY_CARE_PROVIDER_SITE_OTHER): Payer: Commercial Managed Care - PPO | Admitting: Psychiatry

## 2022-06-23 VITALS — BP 113/79 | HR 99 | Temp 98.7°F | Ht 63.0 in | Wt 183.4 lb

## 2022-06-23 DIAGNOSIS — F411 Generalized anxiety disorder: Secondary | ICD-10-CM

## 2022-06-23 DIAGNOSIS — F17200 Nicotine dependence, unspecified, uncomplicated: Secondary | ICD-10-CM | POA: Diagnosis not present

## 2022-06-23 DIAGNOSIS — G4701 Insomnia due to medical condition: Secondary | ICD-10-CM | POA: Diagnosis not present

## 2022-06-23 DIAGNOSIS — F4010 Social phobia, unspecified: Secondary | ICD-10-CM | POA: Diagnosis not present

## 2022-06-23 NOTE — Progress Notes (Signed)
[facility] MD OP Progress Note  06/23/2022 12:07 PM Savannah [name]  MRN:  [id number]  Chief Complaint:  Chief Complaint  Patient presents with   Follow-up   Anxiety   Medication Refill   HPI: Savannah [name] is a 37 year old Caucasian female, married, lives in [place], employed, has a history of GAD, social anxiety disorder, insomnia, tobacco use disorder, vitamin D deficiency(recently diagnosed), hyperlipidemia was evaluated in office today.  Patient today reports overall she is doing fairly well with regards to her mood symptoms.  The venlafaxine is helpful.  She denies side effects.  Would like to stay on this dosage.  Reports she does have good support system at home and at work.  That does help.  She reports that she is able to take a break, use something cold to hold onto when she is significantly anxious and these are all good strategies that has helped.  Sleep continues to be a problem on and off because she continues to switch back and forth with sleeping at night and then during the day due to her work schedule.  She continues to work on her sleep hygiene.  She has not been following up with her therapist Ms.Hussami in a long time.  However does agree to follow-up to schedule an appointment for maintenance therapy as needed  Patient continues to smoke cigarettes.  She reports she had increased her smoking to more than 6 after the holidays.  However currently she has been trying to cut back again and smokes around 4-5.  Not interested in nicotine replacement or Chantix.  Would like to use her own coping strategies.  Patient denies any suicidality, homicidality or perceptual disturbances.    Visit Diagnosis:    ICD-10-CM   1. GAD (generalized anxiety disorder)  F41.1     2. Social anxiety disorder  F40.10     3. Insomnia due to medical condition  G47.01    mood , sleep hygiene    4. Tobacco use disorder  F17.200       Past Psychiatric History: Reviewed past psychiatric  history from progress note on 06/04/2021.  Past trials of sertraline-headaches, lorazepam-side effects, Klonopin-side effects, melatonin-did not work, Cymbalta-side effect.  Past Medical History:  Past Medical History:  Diagnosis Date   Anxiety    Depression    Eardrum rupture, left    Headache     Past Surgical History:  Procedure Laterality Date   CARPAL TUNNEL RELEASE Right 09/29/2014   Procedure: CARPAL TUNNEL RELEASE;  Surgeon: Christophe Louis, MD;  Location: [facility] ORS;  Service: Orthopedics;  Laterality: Right;   CESAREAN SECTION     IUD placement     MYRINGOTOMY WITH TUBE PLACEMENT      Family Psychiatric History: Reviewed family psychiatric history from progress note on 06/04/2021.  Family History:  Family History  Problem Relation Age of Onset   Healthy Mother    Bone cancer Maternal Grandfather    Mental illness Neg Hx     Social History: Reviewed social history from progress note on 06/04/2021. Social History   Socioeconomic History   Marital status: Married    Spouse name: Savannah [name]   Number of children: 3   Years of education: Not on file   Highest education level: High school graduate  Occupational History   Not on file  Tobacco Use   Smoking status: Some Days    Packs/day: 0.25    Types: Cigarettes    Last attempt to quit: 06/14/2018    Years  since quitting: 4.0    Passive exposure: Current   Smokeless tobacco: Never  Vaping Use   Vaping Use: Never used  Substance and Sexual Activity   Alcohol use: Not Currently   Drug use: No   Sexual activity: Yes    Birth control/protection: I.U.D.  Other Topics Concern   Not on file  Social History Narrative   Not on file   Social Determinants of Health   Financial Resource Strain: Not on file  Food Insecurity: Not on file  Transportation Needs: Not on file  Physical Activity: Inactive (03/26/2017)   Exercise Vital Sign    Days of Exercise per Week: 0 days    Minutes of Exercise per Session: 0 min   Stress: Stress Concern Present (03/26/2017)   [place]    Feeling of Stress : To some extent  Social Connections: Somewhat Isolated (03/26/2017)   Social Connection and Isolation Panel [NHANES]    Frequency of Communication with Friends and Family: Three times a week    Frequency of Social Gatherings with Friends and Family: Once a week    Attends Religious Services: Never    Marine scientist or Organizations: No    Attends Archivist Meetings: Never    Marital Status: Living with partner    Allergies:  Allergies  Allergen Reactions   Ibuprofen Other (See Comments)    Burns stomach    Metabolic Disorder Labs: No results found for: "HGBA1C", "MPG" No results found for: "PROLACTIN" Lab Results  Component Value Date   CHOL 200 (H) 02/21/2022   TRIG 263 (H) 02/21/2022   HDL 29 (L) 02/21/2022   LDLCALC 124 (H) 02/21/2022   LDLCALC 145 (H) 01/25/2021   Lab Results  Component Value Date   TSH 2.670 02/21/2022   TSH 2.120 01/25/2021    Therapeutic Level Labs: No results found for: "LITHIUM" No results found for: "VALPROATE" No results found for: "CBMZ"  Current Medications: Current Outpatient Medications  Medication Sig Dispense Refill   amoxicillin-clavulanate (AUGMENTIN) 875-125 MG tablet Take 1 tablet by mouth 2 (two) times daily. Take with food. 20 tablet 0   cyclobenzaprine (FLEXERIL) 10 MG tablet Take 10 mg by mouth 3 (three) times daily.     ergocalciferol (VITAMIN D2) 1.25 MG (50000 UT) capsule Take 1 capsule (50,000 Units total) by mouth once a week. 4 capsule 3   levonorgestrel (MIRENA) 20 MCG/24HR IUD 1 each once by Intrauterine route.     modafinil (PROVIGIL) 200 MG tablet TAKE 1 TABLET BY MOUTH EVERY DAY 30 tablet 2   omeprazole (PRILOSEC) 40 MG capsule TAKE 1 CAPSULE (40 MG TOTAL) BY MOUTH DAILY. 30 capsule 1   rosuvastatin (CRESTOR) 5 MG tablet TAKE 1 TABLET (5 MG TOTAL) BY  MOUTH DAILY. 30 tablet 3   venlafaxine XR (EFFEXOR-XR) 37.5 MG 24 hr capsule TAKE 1 CAPSULE BY MOUTH DAILY WITH BREAKFAST. 90 capsule 0   No current facility-administered medications for this visit.     Musculoskeletal: Strength & Muscle Tone: within normal limits Gait & Station: normal Patient leans: N/A  Psychiatric Specialty Exam: Review of Systems  Psychiatric/Behavioral:  Positive for sleep disturbance.   All other systems reviewed and are negative.   Blood pressure 113/79, pulse 99, temperature 98.7 F (37.1 C), temperature source Oral, height 5' 3"$  (1.6 m), weight 183 lb 6.4 oz (83.2 kg), SpO2 96 %.Body mass index is 32.49 kg/m.  General Appearance: Casual  Eye Contact:  Good  Speech:  Clear and Coherent  Volume:  Normal  Mood:  Anxious  Affect:  Congruent  Thought Process:  Goal Directed and Descriptions of Associations: Intact  Orientation:  Full (Time, Place, and Person)  Thought Content: Logical   Suicidal Thoughts:  No  Homicidal Thoughts:  No  Memory:  Immediate;   Fair Recent;   Fair Remote;   Fair  Judgement:  Fair  Insight:  Fair  Psychomotor Activity:  Normal  Concentration:  Concentration: Fair and Attention Span: Fair  Recall:  [employer] of Knowledge: Fair  Language: Fair  Akathisia:  No  Handed:  Right  AIMS (if indicated): not done  Assets:  Communication Skills Desire for Improvement Housing Social Support  ADL's:  Intact  Cognition: WNL  Sleep:   Restless due to work shift changes   Screenings: Administrator, Civil Service Office Visit from 12/23/2021 in [place] Office Visit from 10/18/2021 in [place] Office Visit from 09/05/2021 in [place] Total Score 0 0 0      [place] Office Visit from 06/23/2022 in [facility] Office Visit from 03/24/2022 in [place] Office Visit from 12/23/2021 in [facility] Office Visit from 10/18/2021 in [place] Office Visit from 09/05/2021 in [place]  Total GAD-7 Score 5 3 12 4 8      $ PHQ2-9    [place] Office Visit from 06/23/2022 in [facility] Office Visit from 03/24/2022 in [place] Office Visit from 12/23/2021 in [place] Office Visit from 10/18/2021 in [place] Office Visit from 09/05/2021 in [place]  PHQ-2 Total Score 2 1 2 2 1  $ PHQ-9 Total Score 8 -- 6 6 6      $ [place] Office Visit from 06/23/2022 in [facility] Office Visit from 03/24/2022 in [place] Office Visit from 12/23/2021 in [place] No Risk No Risk No Risk        Assessment and Plan: Nedda Fripp is a 37 year old Caucasian female, married, lives in [place], has a history of GAD, social anxiety disorder, insomnia, hyperlipidemia, vitamin D deficiency was evaluated in office today.  Patient is currently stable.  Plan GAD-stable Venlafaxine extended release 37.5 mg p.o. daily with breakfast Patient encouraged to have psychotherapy sessions with Ms. Christina Hussami as needed  Socia anxiety disorder-improving Continue CBT  Insomnia-stable Modafinil as prescribed by her provider Multiple sleep studies in the past-CPAP was not recommended Discussed sleep hygiene techniques-current sleep issues due to a change in her sleep schedule due to her work shift.  Tobacco use disorder-improving Provided  counseling for 1 minute.  Patient declines medications, nicotine replacement therapy.  Follow-up in clinic in 3 months or sooner if needed.    This note was generated in part or whole with voice recognition software. Voice recognition is usually quite accurate but there are transcription errors that can and very often do occur. I apologize for any typographical errors that were not detected and corrected.     Ursula Alert, MD 06/23/2022, 12:07 PM

## 2022-06-28 ENCOUNTER — Other Ambulatory Visit: Payer: Self-pay | Admitting: Physician Assistant

## 2022-06-30 ENCOUNTER — Telehealth: Payer: Self-pay | Admitting: Physician Assistant

## 2022-06-30 NOTE — Telephone Encounter (Signed)
Otolaryngology appointment>> 07/31/22 with [facility]

## 2022-07-10 ENCOUNTER — Ambulatory Visit: Payer: Commercial Managed Care - PPO | Admitting: Dermatology

## 2022-07-10 ENCOUNTER — Encounter: Payer: Self-pay | Admitting: Dermatology

## 2022-07-10 VITALS — BP 131/84 | HR 82

## 2022-07-10 DIAGNOSIS — Z79899 Other long term (current) drug therapy: Secondary | ICD-10-CM | POA: Diagnosis not present

## 2022-07-10 DIAGNOSIS — L409 Psoriasis, unspecified: Secondary | ICD-10-CM

## 2022-07-10 DIAGNOSIS — L4050 Arthropathic psoriasis, unspecified: Secondary | ICD-10-CM

## 2022-07-10 MED ORDER — ZORYVE 0.3 % EX CREA: 1.0000 | TOPICAL_CREAM | Freq: Every day | CUTANEOUS | 3 refills | Status: DC

## 2022-07-10 NOTE — Progress Notes (Signed)
   New Patient Visit  Subjective  Savannah [name] is a 37 y.o. female who presents for the following: Psoriasis (Trunk, extremities, pt flared 15 yrs ago when pregnant, then this flare 68m  TMC 0.025% prn, bleach baths x 2, itchy, hx of joint aches, hips feel stiff when she gets up from sleeping, sometimes improves as day goes on, sometimes not, pt was prescribed biologic from Dr. GPhillip Heal~ [address]yrgo and she stated it made her cough).  [facility]  The following portions of the chart were reviewed this encounter and updated as appropriate:   Tobacco  Allergies  Meds  Problems  Med Hx  Surg Hx  Fam Hx     Review of Systems:  No other skin or systemic complaints except as noted in HPI or Assessment and Plan.  Objective  Well appearing patient in no apparent distress; mood and affect are within normal limits.  A focused examination was performed including trunk, arms, legs. Relevant physical exam findings are noted in the Assessment and Plan.  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 It is associated with an increased risk of inflammatory bowel disease, heart disease, non-alcoholic fatty liver disease, and depression.  Treatments include light and laser treatments; topical medications; and systemic medications including oral and injectables.   Will send referral to Rheumatology for evaluation for possible Psoriatic Arthritis  Start Zoryve qd, Sample x 2 Lot NBBD  exp 07/2023 Decrease TMC 0.025% cr to 3d/wk Will request pts records from Dr. GrPhillip Healefore deciding which biologic to start.  Pt apparently was on an injectable Biologic for Psoriasis prescribed by Dr GrPhillip Healn past that helped but not satisfactorily, but pt does not know which medication. Plan systemic Psoriasis treatment - Likely Biologic injection due to signifcant skin and possible joint involvement and hand and foot psoriasis affecting activities of daily living and work.  Roflumilast (ZORYVE) 0.3 % CREA - hands, legs, feet Apply 1 Application topically daily.  Related Procedures Ambulatory referral to Rheumatology  Return in about 1 month (around 08/08/2022) for Psoriasis f/u.  I, SoOthelia PullingRMA, am acting as scribe for DaSarina SerMD . Documentation: I have reviewed the above documentation for accuracy and completeness, and I agree with the above.  DaSarina SerMD

## 2022-07-10 NOTE — Patient Instructions (Addendum)
Decrease Triamcinolone 0.025 cream to 3 days a week Start Zoryve cream once daily 7 days a week  Due to recent changes in healthcare laws, you may see results of your pathology and/or laboratory studies on MyChart before the doctors have had a chance to review them. We understand that in some cases there may be results that are confusing or concerning to you. Please understand that not all results are received at the same time and often the doctors may need to interpret multiple results in order to provide you with the best plan of care or course of treatment. Therefore, we ask that you please give [place] 2 business days to thoroughly review all your results before contacting the office for clarification. Should we see a critical lab result, you will be contacted sooner.   If You Need Anything After Your Visit  If you have any questions or concerns for your doctor, please call our main line at [phone] and press option 4 to reach your doctor's medical assistant. If no one answers, please leave a voicemail as directed and we will return your call as soon as possible. Messages left after 4 pm will be answered the following business day.   You may also send [place] a message via [place]. We typically respond to MyChart messages within 1-2 business days.  For prescription refills, please ask your pharmacy to contact our office. Our fax number is [phone].  If you have an urgent issue when the clinic is closed that cannot wait until the next business day, you can page your doctor at the number below.    Please note that while we do our best to be available for urgent issues outside of office hours, we are not available 24/7.   If you have an urgent issue and are unable to reach [place], you may choose to seek medical care at your doctor's office, retail clinic, urgent care center, or emergency room.  If you have a medical emergency, please immediately call 911 or go to the emergency department.  Pager  Numbers  - Dr. Nehemiah Massed: [phone]  - Dr. Laurence Ferrari: [phone]  - Dr. Nicole Kindred: [phone]  In the event of inclement weather, please call our main line at [phone] for an update on the status of any delays or closures.  Dermatology Medication Tips: Please keep the boxes that topical medications come in in order to help keep track of the instructions about where and how to use these. Pharmacies typically print the medication instructions only on the boxes and not directly on the medication tubes.   If your medication is too expensive, please contact our office at [phone] option 4 or send [place] a message through [place].   We are unable to tell what your co-pay for medications will be in advance as this is different depending on your insurance coverage. However, we may be able to find a substitute medication at lower cost or fill out paperwork to get insurance to cover a needed medication.   If a prior authorization is required to get your medication covered by your insurance company, please allow [place] 1-2 business days to complete this process.  Drug prices often vary depending on where the prescription is filled and some pharmacies may offer cheaper prices.  The website www.goodrx.com contains coupons for medications through different pharmacies. The prices here do not account for what the cost may be with help from insurance (it may be cheaper with your insurance), but the website can give you the price  if you did not use any insurance.  - You can print the associated coupon and take it with your prescription to the pharmacy.  - You may also stop by our office during regular business hours and pick up a GoodRx coupon card.  - If you need your prescription sent electronically to a different pharmacy, notify our office through [facility] or by phone at [phone] option 4.     Si Usted Necesita Algo Despus de Su Visita  Tambin puede enviarnos un mensaje a travs de  Pharmacist, community. Por lo general respondemos a los mensajes de MyChart en el transcurso de 1 a 2 das hbiles.  Para renovar recetas, por favor pida a su farmacia que se ponga en contacto con nuestra oficina. Harland Dingwall de fax es [place] [phone].  Si tiene un asunto urgente cuando la clnica est cerrada y que no puede esperar hasta el siguiente da hbil, puede llamar/localizar a su doctor(a) al nmero que aparece a continuacin.   Por favor, tenga en cuenta que aunque hacemos todo lo posible para estar disponibles para asuntos urgentes fuera del horario de [place], no estamos disponibles las 24 horas del da, los 7 das de la [place].   Si tiene un problema urgente y no puede comunicarse con nosotros, puede optar por buscar atencin mdica  en el consultorio de su doctor(a), en una clnica privada, en un centro de atencin urgente o en una sala de emergencias.  Si tiene Engineering geologist, por favor llame inmediatamente al 911 o vaya a la sala de emergencias.  Nmeros de bper  - Dr. Nehemiah Massed: [phone]  - Dra. Moye: [phone]  - Dra. Nicole Kindred: [phone]  En caso de inclemencias del [place], por favor llame a Johnsie Kindred principal al [phone] para una actualizacin sobre el [place] de cualquier retraso o cierre.  Consejos para la medicacin en dermatologa: Por favor, guarde las cajas en las que vienen los medicamentos de uso tpico para ayudarle a seguir las instrucciones sobre dnde y cmo usarlos. Las farmacias generalmente imprimen las instrucciones del medicamento slo en las cajas y no directamente en los tubos del [place].   Si su medicamento es muy caro, por favor, pngase en contacto con Zigmund Daniel llamando al [phone] y presione la opcin 4 o envenos un mensaje a travs de Pharmacist, community.   No podemos decirle cul ser su copago por los medicamentos por adelantado ya que esto es diferente dependiendo de la cobertura de su seguro. Sin embargo, es posible que  podamos encontrar un medicamento sustituto a Electrical engineer un formulario para que el seguro cubra el medicamento que se considera necesario.   Si se requiere una autorizacin previa para que su compaa de seguros [place] su medicamento, por favor permtanos de 1 a 2 das hbiles para completar este proceso.  Los precios de los medicamentos varan con frecuencia dependiendo del Environmental consultant de dnde se surte la receta y alguna farmacias pueden ofrecer precios ms baratos.  El sitio web www.goodrx.com tiene cupones para medicamentos de Airline pilot. Los precios aqu no tienen en cuenta lo que podra costar con la ayuda del seguro (puede ser ms barato con su seguro), pero el sitio web puede darle el precio si no utiliz Research scientist (physical sciences).  - Puede imprimir el cupn correspondiente y llevarlo con su receta a la farmacia.  - Tambin puede pasar por nuestra oficina durante el horario de atencin regular y Charity fundraiser una tarjeta de cupones de GoodRx.  - Si necesita que su  su receta se enve electrnicamente a una farmacia diferente, informe a nuestra oficina a travs de MyChart de [place] o por telfono llamando al [phone] y presione la opcin 4.  

## 2022-07-16 ENCOUNTER — Other Ambulatory Visit: Payer: Self-pay | Admitting: Physician Assistant

## 2022-07-16 DIAGNOSIS — G4726 Circadian rhythm sleep disorder, shift work type: Secondary | ICD-10-CM

## 2022-07-19 ENCOUNTER — Encounter: Payer: Self-pay | Admitting: Physician Assistant

## 2022-07-19 ENCOUNTER — Encounter: Payer: Self-pay | Admitting: Dermatology

## 2022-07-22 ENCOUNTER — Telehealth: Payer: Self-pay

## 2022-07-22 NOTE — Telephone Encounter (Signed)
Completed P.A. for patient's Modafinil.

## 2022-07-25 ENCOUNTER — Ambulatory Visit (INDEPENDENT_AMBULATORY_CARE_PROVIDER_SITE_OTHER): Payer: Commercial Managed Care - PPO | Admitting: Nurse Practitioner

## 2022-07-25 ENCOUNTER — Encounter: Payer: Self-pay | Admitting: Physician Assistant

## 2022-07-25 ENCOUNTER — Telehealth: Payer: Self-pay

## 2022-07-25 ENCOUNTER — Ambulatory Visit
Admission: RE | Admit: 2022-07-25 | Discharge: 2022-07-25 | Disposition: A | Discharge status: Home / Self Care | Payer: Commercial Managed Care - PPO | Source: Ambulatory Visit | Attending: Nurse Practitioner | Admitting: Nurse Practitioner

## 2022-07-25 ENCOUNTER — Encounter: Payer: Self-pay | Admitting: Nurse Practitioner

## 2022-07-25 VITALS — BP 111/74 | HR 93 | Temp 98.3°F | Resp 16 | Ht 63.0 in | Wt 182.4 lb

## 2022-07-25 DIAGNOSIS — I808 Phlebitis and thrombophlebitis of other sites: Secondary | ICD-10-CM | POA: Insufficient documentation

## 2022-07-25 MED ORDER — DOXYCYCLINE HYCLATE 100 MG PO TABS: 100.0000 mg | ORAL_TABLET | Freq: Two times a day (BID) | ORAL | 0 refills | Status: AC

## 2022-07-25 NOTE — Progress Notes (Signed)
[facility] [place], [place] [address]  Internal MEDICINE  Office Visit Note  Patient Name: Savannah [name]  [id number]  [id number]  Date of Service: 07/25/2022  Chief Complaint  Patient presents with   Acute Visit    Right arm pain x 1 week      HPI Savannah [name] presents for an acute sick visit for right arm redness and pain Tenderness to right upper arm with evidence of phlebitis  Need to rule out blood clot    Current Medication:  Outpatient Encounter Medications as of 07/25/2022  Medication Sig   amoxicillin-clavulanate (AUGMENTIN) 875-125 MG tablet Take 1 tablet by mouth 2 (two) times daily. Take with food.   cyclobenzaprine (FLEXERIL) 10 MG tablet Take 10 mg by mouth 3 (three) times daily.   doxycycline (VIBRA-TABS) 100 MG tablet Take 1 tablet (100 mg total) by mouth 2 (two) times daily for 10 days. Take with food   levonorgestrel (MIRENA) 20 MCG/24HR IUD 1 each once by Intrauterine route.   modafinil (PROVIGIL) 200 MG tablet TAKE 1 TABLET BY MOUTH EVERY DAY   omeprazole (PRILOSEC) 40 MG capsule TAKE 1 CAPSULE (40 MG TOTAL) BY MOUTH DAILY.   Roflumilast (ZORYVE) 0.3 % CREA Apply 1 Application topically daily.   rosuvastatin (CRESTOR) 5 MG tablet TAKE 1 TABLET (5 MG TOTAL) BY MOUTH DAILY.   triamcinolone (KENALOG) 0.025 % cream Apply 1 Application topically 2 (two) times daily.   venlafaxine XR (EFFEXOR-XR) 37.5 MG 24 hr capsule TAKE 1 CAPSULE BY MOUTH DAILY WITH BREAKFAST.   Vitamin D, Ergocalciferol, (DRISDOL) 1.25 MG (50000 UNIT) CAPS capsule TAKE 1 CAPSULE BY MOUTH ONE TIME PER WEEK   No facility-administered encounter medications on file as of 07/25/2022.      Medical History: Past Medical History:  Diagnosis Date   Anxiety    Depression    Eardrum rupture, left    Headache      Vital Signs: BP 111/74   Pulse 93   Temp 98.3 F (36.8 C)   Resp 16   Ht 5\' 3"  (1.6 m)   Wt 182 lb 6.4 oz (82.7 kg)   SpO2 95%   BMI 32.31 kg/m     Review of Systems  Constitutional:  Negative for chills, fatigue and unexpected weight change.  HENT:  Negative for congestion, rhinorrhea, sneezing and sore throat.   Eyes:  Negative for redness.  Respiratory:  Negative for cough, chest tightness and shortness of breath.   Cardiovascular:  Negative for chest pain and palpitations.  Gastrointestinal:  Negative for abdominal pain, constipation, diarrhea, nausea and vomiting.  Genitourinary:  Negative for dysuria and frequency.  Musculoskeletal:  Negative for arthralgias, back pain, joint swelling and neck pain.  Skin:  Positive for color change (right upper arm). Negative for rash.  Neurological: Negative.  Negative for tremors and numbness.  Hematological:  Negative for adenopathy. Does not bruise/bleed easily.  Psychiatric/Behavioral:  Negative for behavioral problems (Depression), sleep disturbance and suicidal ideas. The patient is not nervous/anxious.     Physical Exam Vitals reviewed.  Constitutional:      General: She is not in acute distress.    Appearance: Normal appearance. She is not ill-appearing.  HENT:     Head: Normocephalic and atraumatic.  Eyes:     Pupils: Pupils are equal, round, and reactive to light.  Cardiovascular:     Rate and Rhythm: Normal rate and regular rhythm.  Pulmonary:     Effort: Pulmonary effort is normal. No  respiratory distress.  Neurological:     Mental Status: She is alert and oriented to person, place, and time.  Psychiatric:        Mood and Affect: Mood normal.        Behavior: Behavior normal.       Assessment/Plan: 1. Phlebitis of right upper extremity Tx with antibiotic. Ultrasound to rule out SVT or DVT - US Venous Img Upper Uni Right(DVT); Future - doxycycline (VIBRA-TABS) 100 MG tablet; Take 1 tablet (100 mg total) by mouth 2 (two) times daily for 10 days. Take with food  Dispense: 20 tablet; Refill: 0   General Counseling: Lynzey verbalizes understanding of the findings  of todays visit and agrees with plan of treatment. I have discussed any further diagnostic evaluation that may be needed or ordered today. We also reviewed her medications today. she has been encouraged to call the office with any questions or concerns that should arise related to todays visit.    Counseling:    Orders Placed This Encounter  Procedures   US Venous Img Upper Uni Right(DVT)    Meds ordered this encounter  Medications   doxycycline (VIBRA-TABS) 100 MG tablet    Sig: Take 1 tablet (100 mg total) by mouth 2 (two) times daily for 10 days. Take with food    Dispense:  20 tablet    Refill:  0    Return if symptoms worsen or fail to improve.  [place] Controlled Substance Database was reviewed by me for overdose risk score (ORS)  Time spent:20 Minutes Time spent with patient included reviewing progress notes, labs, imaging studies, and discussing plan for follow up.   This patient was seen by Jonetta Osgood, FNP-C in collaboration with Dr. Clayborn Bigness as a part of collaborative care agreement.  Elodie Panameno R. Valetta Fuller, MSN, FNP-C Internal Medicine

## 2022-07-25 NOTE — Telephone Encounter (Signed)
Spoke with pt and made her appt today  

## 2022-07-25 NOTE — Telephone Encounter (Signed)
Patient was notified that her ultrasound was negative for a blood clot and that Alyssa send in an prescription for her.

## 2022-07-31 ENCOUNTER — Other Ambulatory Visit: Payer: Self-pay | Admitting: Otolaryngology

## 2022-07-31 DIAGNOSIS — H9012 Conductive hearing loss, unilateral, left ear, with unrestricted hearing on the contralateral side: Secondary | ICD-10-CM

## 2022-07-31 DIAGNOSIS — H7292 Unspecified perforation of tympanic membrane, left ear: Secondary | ICD-10-CM

## 2022-08-01 ENCOUNTER — Encounter: Payer: Self-pay | Admitting: Physician Assistant

## 2022-08-01 ENCOUNTER — Ambulatory Visit: Payer: Commercial Managed Care - PPO | Admitting: Physician Assistant

## 2022-08-01 VITALS — BP 123/89 | HR 92 | Temp 98.3°F | Resp 16 | Ht 63.0 in | Wt 184.4 lb

## 2022-08-01 DIAGNOSIS — L409 Psoriasis, unspecified: Secondary | ICD-10-CM | POA: Diagnosis not present

## 2022-08-01 DIAGNOSIS — H93232 Hyperacusis, left ear: Secondary | ICD-10-CM

## 2022-08-01 DIAGNOSIS — I808 Phlebitis and thrombophlebitis of other sites: Secondary | ICD-10-CM | POA: Diagnosis not present

## 2022-08-01 DIAGNOSIS — G4726 Circadian rhythm sleep disorder, shift work type: Secondary | ICD-10-CM

## 2022-08-01 NOTE — Progress Notes (Signed)
[facility] [place], [place] [address]  Internal MEDICINE  Office Visit Note  Patient Name: Savannah [name]  [id number]  [id number]  Date of Service: 08/06/2022  Chief Complaint  Patient presents with   Follow-up   Depression    HPI Pt is here for routine follow up -Tingling in fingers and toes randomly. Previously had carpal tunnel surgery on right side. Sometimes has some inflammation a little in right wrist as well. May try brace. B12 previously normal. -Seeing ENT now, does have perf in eardrum and hearing loss. She was prescribed ear drops. Planning to have a CT scan and possibly surgery in future.  -She was seen by Jonetta Osgood, FNP last week and had an US done for right upper extremity redness and swelling, which did not show any DVT, and was prescribed ABX for phlebitis. Today right upper arm swelling, pain and tenderness has improved, still a little tenderness with raising arm. Still on ABX and will complete course and continue to monitor advised to call if not completely resolved after completion of ABX. -Did see dermatology for psoriasis, considering shots, but she is not comfortable giving these to herself. She is on a topical cream currently to start and then goes back next week to discuss next plans.  -Dermatology also recommended she she rheumatology due to concern for psoriatic arthritis. Discussed this was a great idea given her joint pain, especially in her wrist. -Taking modafinil as prescribed  Current Medication: Outpatient Encounter Medications as of 08/01/2022  Medication Sig   cyclobenzaprine (FLEXERIL) 10 MG tablet Take 10 mg by mouth 3 (three) times daily.   [EXPIRED] doxycycline (VIBRA-TABS) 100 MG tablet Take 1 tablet (100 mg total) by mouth 2 (two) times daily for 10 days. Take with food   levonorgestrel (MIRENA) 20 MCG/24HR IUD 1 each once by Intrauterine route.   modafinil (PROVIGIL) 200 MG tablet TAKE 1 TABLET BY MOUTH EVERY  DAY   omeprazole (PRILOSEC) 40 MG capsule TAKE 1 CAPSULE (40 MG TOTAL) BY MOUTH DAILY.   Roflumilast (ZORYVE) 0.3 % CREA Apply 1 Application topically daily.   rosuvastatin (CRESTOR) 5 MG tablet TAKE 1 TABLET (5 MG TOTAL) BY MOUTH DAILY.   triamcinolone (KENALOG) 0.025 % cream Apply 1 Application topically 2 (two) times daily.   venlafaxine XR (EFFEXOR-XR) 37.5 MG 24 hr capsule TAKE 1 CAPSULE BY MOUTH DAILY WITH BREAKFAST.   Vitamin D, Ergocalciferol, (DRISDOL) 1.25 MG (50000 UNIT) CAPS capsule TAKE 1 CAPSULE BY MOUTH ONE TIME PER WEEK   [DISCONTINUED] amoxicillin-clavulanate (AUGMENTIN) 875-125 MG tablet Take 1 tablet by mouth 2 (two) times daily. Take with food.   No facility-administered encounter medications on file as of 08/01/2022.    Surgical History: Past Surgical History:  Procedure Laterality Date   CARPAL TUNNEL RELEASE Right 09/29/2014   Procedure: CARPAL TUNNEL RELEASE;  Surgeon: Christophe Louis, MD;  Location: [facility] ORS;  Service: Orthopedics;  Laterality: Right;   CESAREAN SECTION     IUD placement     MYRINGOTOMY WITH TUBE PLACEMENT      Medical History: Past Medical History:  Diagnosis Date   Anxiety    Depression    Eardrum rupture, left    Headache     Family History: Family History  Problem Relation Age of Onset   Healthy Mother    Bone cancer Maternal Grandfather    Mental illness Neg Hx     Social History   Socioeconomic History   Marital status: Married  Spouse name: katherine   Number of children: 3   Years of education: Not on file   Highest education level: High school graduate  Occupational History   Not on file  Tobacco Use   Smoking status: Some Days    Packs/day: .25    Types: Cigarettes    Last attempt to quit: 06/14/2018    Years since quitting: 4.1    Passive exposure: Current   Smokeless tobacco: Never   Tobacco comments:    5-6 cigarettes daily  Vaping Use   Vaping Use: Never used  Substance and Sexual Activity   Alcohol  use: Not Currently   Drug use: No   Sexual activity: Yes    Birth control/protection: I.U.D.  Other Topics Concern   Not on file  Social History Narrative   Not on file   Social Determinants of Health   Financial Resource Strain: Not on file  Food Insecurity: Not on file  Transportation Needs: Not on file  Physical Activity: Inactive (03/26/2017)   Exercise Vital Sign    Days of Exercise per Week: 0 days    Minutes of Exercise per Session: 0 min  Stress: Stress Concern Present (03/26/2017)   [place]    Feeling of Stress : To some extent  Social Connections: Somewhat Isolated (03/26/2017)   Social Connection and Isolation Panel [NHANES]    Frequency of Communication with Friends and Family: Three times a week    Frequency of Social Gatherings with Friends and Family: Once a week    Attends Religious Services: Never    Marine scientist or Organizations: No    Attends Archivist Meetings: Never    Marital Status: Living with partner  Intimate Partner Violence: Not At Risk (03/26/2017)   Humiliation, Afraid, Rape, and Kick questionnaire    Fear of Current or Ex-Partner: No    Emotionally Abused: No    Physically Abused: No    Sexually Abused: No      Review of Systems  Constitutional:  Negative for chills, fatigue and unexpected weight change.  HENT:  Negative for congestion, rhinorrhea, sneezing and sore throat.   Eyes:  Negative for redness.  Respiratory:  Negative for cough, chest tightness and shortness of breath.   Cardiovascular:  Negative for chest pain and palpitations.  Gastrointestinal:  Negative for abdominal pain, constipation, diarrhea, nausea and vomiting.  Genitourinary:  Negative for dysuria and frequency.  Musculoskeletal:  Positive for arthralgias. Negative for back pain, joint swelling and neck pain.  Skin:  Negative for rash.  Neurological:  Positive for numbness.  Negative for tremors.  Hematological:  Negative for adenopathy. Does not bruise/bleed easily.  Psychiatric/Behavioral:  Positive for sleep disturbance. Negative for behavioral problems (Depression) and suicidal ideas. The patient is nervous/anxious.     Vital Signs: BP 123/89   Pulse 92   Temp 98.3 F (36.8 C)   Resp 16   Ht 5\' 3"  (1.6 m)   Wt 184 lb 6.4 oz (83.6 kg)   SpO2 98%   BMI 32.66 kg/m    Physical Exam Vitals reviewed.  Constitutional:      General: She is not in acute distress.    Appearance: Normal appearance. She is not ill-appearing.  HENT:     Head: Normocephalic and atraumatic.  Eyes:     Pupils: Pupils are equal, round, and reactive to light.  Cardiovascular:     Rate and Rhythm: Normal rate and  regular rhythm.  Pulmonary:     Effort: Pulmonary effort is normal. No respiratory distress.  Musculoskeletal:        General: Tenderness present.     Comments: Right upper extremity swelling decreased, mildly tender still, no erythema  Skin:    General: Skin is warm and dry.     Findings: No erythema.  Neurological:     Mental Status: She is alert and oriented to person, place, and time.  Psychiatric:        Mood and Affect: Mood normal.        Behavior: Behavior normal.        Assessment/Plan: 1. Phlebitis of right upper extremity Improving, continue ABX as prescribed and continue to monitor  2. Hearing abnormally acute, left Followed by ENT with plan for CT and possible surgery due to perforation  3. 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 I have discussed any further diagnostic evaluation that may be needed or ordered today. We also reviewed her medications today. she has been encouraged to call the office with any questions or concerns that  should arise related to todays visit.    No orders of the defined types were placed in this encounter.   No orders of the defined types were placed in this encounter.   This patient was seen by Drema Dallas, PA-C in collaboration with Dr. Clayborn Bigness as a part of collaborative care agreement.   Total time spent:30 Minutes Time spent includes review of chart, medications, test results, and follow up plan with the patient.      Dr Lavera Guise Internal medicine

## 2022-08-07 ENCOUNTER — Ambulatory Visit: Payer: Commercial Managed Care - PPO | Admitting: Dermatology

## 2022-08-07 VITALS — BP 124/84 | HR 94

## 2022-08-07 DIAGNOSIS — Z79899 Other long term (current) drug therapy: Secondary | ICD-10-CM

## 2022-08-07 DIAGNOSIS — L409 Psoriasis, unspecified: Secondary | ICD-10-CM | POA: Diagnosis not present

## 2022-08-07 MED ORDER — ZORYVE 0.3 % EX CREA: TOPICAL_CREAM | CUTANEOUS | 5 refills | Status: DC

## 2022-08-07 NOTE — Progress Notes (Signed)
   Follow-Up Visit   Subjective  Savannah [name] is a 37 y.o. female who presents for the following: Psoriasis - currently using Zoryve cream x 2 weeks (it took a long time for insurance to approve it) and has seen a significant improvement. Areas include the hands, feet, arms, and legs. She does have joint pain and is scheduled to see the rheumatologist in April.   The following portions of the chart were reviewed this encounter and updated as appropriate: medications, allergies, medical history  Review of Systems:  No other skin or systemic complaints except as noted in HPI or Assessment and Plan.  Objective  Well appearing patient in no apparent distress; mood and affect are within normal limits.  Areas Examined: The face, hands, feet, and legs  Relevant exam findings are noted in the Assessment and Plan.     Assessment & Plan   Psoriasis   PSORIASIS (With possible psoriatic arthritis?) Well-demarcated erythematous papules/plaques with silvery scale, guttate pink scaly papules. 15% BSA.  Chronic and persistent condition with duration or expected duration over one year. Condition is symptomatic/ bothersome to patient. Not currently at goal but significantly improved compared to previous photos.  Patient c/o joint pain, scheduled to see rheumatologist on 09/02/22  Treatment Plan: Continue Zoryve cream QHS. Discussed she should use for at least six weeks to see peak results.   Counseling on psoriasis and coordination of care  psoriasis is a chronic non-curable, but treatable genetic/hereditary disease that may have other systemic features affecting other organ systems such as joints (Psoriatic Arthritis). It is associated with an increased risk of inflammatory bowel disease, heart disease, non-alcoholic fatty liver disease, and depression.  Treatments include light and laser treatments; topical medications; and systemic medications including oral and injectables.  Return in  about 3 months (around 11/07/2022) for psoriasis follow up.  Luther Redo, CMA, am acting as scribe for Sarina Ser, MD .  Documentation: I have reviewed the above documentation for accuracy and completeness, and I agree with the above.  Sarina Ser, MD

## 2022-08-07 NOTE — Patient Instructions (Signed)
Due to recent changes in healthcare laws, you may see results of your pathology and/or laboratory studies on MyChart before the doctors have had a chance to review them. We understand that in some cases there may be results that are confusing or concerning to you. Please understand that not all results are received at the same time and often the doctors may need to interpret multiple results in order to provide you with the best plan of care or course of treatment. Therefore, we ask that you please give us 2 business days to thoroughly review all your results before contacting the office for clarification. Should we see a critical lab result, you will be contacted sooner.   If You Need Anything After Your Visit  If you have any questions or concerns for your doctor, please call our main line at [phone] and press option 4 to reach your doctor's medical assistant. If no one answers, please leave a voicemail as directed and we will return your call as soon as possible. Messages left after 4 pm will be answered the following business day.   You may also send us a message via MyChart. We typically respond to MyChart messages within 1-2 business days.  For prescription refills, please ask your pharmacy to contact our office. Our fax number is [phone].  If you have an urgent issue when the clinic is closed that cannot wait until the next business day, you can page your doctor at the number below.    Please note that while we do our best to be available for urgent issues outside of office hours, we are not available 24/7.   If you have an urgent issue and are unable to reach us, you may choose to seek medical care at your doctor's office, retail clinic, urgent care center, or emergency room.  If you have a medical emergency, please immediately call 911 or go to the emergency department.  Pager Numbers  - Dr. Kowalski: [phone]  - Dr. Moye: [phone]  - Dr. Stewart:  [phone]  In the event of inclement weather, please call our main line at [phone] for an update on the status of any delays or closures.  Dermatology Medication Tips: Please keep the boxes that topical medications come in in order to help keep track of the instructions about where and how to use these. Pharmacies typically print the medication instructions only on the boxes and not directly on the medication tubes.   If your medication is too expensive, please contact our office at [phone] option 4 or send us a message through MyChart.   We are unable to tell what your co-pay for medications will be in advance as this is different depending on your insurance coverage. However, we may be able to find a substitute medication at lower cost or fill out paperwork to get insurance to cover a needed medication.   If a prior authorization is required to get your medication covered by your insurance company, please allow us 1-2 business days to complete this process.  Drug prices often vary depending on where the prescription is filled and some pharmacies may offer cheaper prices.  The website www.goodrx.com contains coupons for medications through different pharmacies. The prices here do not account for what the cost may be with help from insurance (it may be cheaper with your insurance), but the website can give you the price if you did not use any insurance.  - You can print the associated coupon and take it with   your prescription to the pharmacy.  - You may also stop by our office during regular business hours and pick up a GoodRx coupon card.  - If you need your prescription sent electronically to a different pharmacy, notify our office through [facility] MyChart or by phone at [phone] option 4.     Si Usted Necesita Algo Despus de Su Visita  Tambin puede enviarnos un mensaje a travs de MyChart. Por lo general respondemos a los mensajes de MyChart en el transcurso de 1 a 2  das hbiles.  Para renovar recetas, por favor pida a su farmacia que se ponga en contacto con nuestra oficina. Nuestro nmero de fax es el [phone].  Si tiene un asunto urgente cuando la clnica est cerrada y que no puede esperar hasta el siguiente da hbil, puede llamar/localizar a su doctor(a) al nmero que aparece a continuacin.   Por favor, tenga en cuenta que aunque hacemos todo lo posible para estar disponibles para asuntos urgentes fuera del horario de oficina, no estamos disponibles las 24 horas del da, los 7 das de la semana.   Si tiene un problema urgente y no puede comunicarse con nosotros, puede optar por buscar atencin mdica  en el consultorio de su doctor(a), en una clnica privada, en un centro de atencin urgente o en una sala de emergencias.  Si tiene una emergencia mdica, por favor llame inmediatamente al 911 o vaya a la sala de emergencias.  Nmeros de bper  - Dr. Kowalski: [phone]  - Dra. Moye: [phone]  - Dra. Stewart: [phone]  En caso de inclemencias del tiempo, por favor llame a nuestra lnea principal al [phone] para una actualizacin sobre el estado de cualquier retraso o cierre.  Consejos para la medicacin en dermatologa: Por favor, guarde las cajas en las que vienen los medicamentos de uso tpico para ayudarle a seguir las instrucciones sobre dnde y cmo usarlos. Las farmacias generalmente imprimen las instrucciones del medicamento slo en las cajas y no directamente en los tubos del medicamento.   Si su medicamento es muy caro, por favor, pngase en contacto con nuestra oficina llamando al [phone] y presione la opcin 4 o envenos un mensaje a travs de MyChart.   No podemos decirle cul ser su copago por los medicamentos por adelantado ya que esto es diferente dependiendo de la cobertura de su seguro. Sin embargo, es posible que podamos encontrar un medicamento sustituto a menor costo o llenar un formulario para que el  seguro cubra el medicamento que se considera necesario.   Si se requiere una autorizacin previa para que su compaa de seguros cubra su medicamento, por favor permtanos de 1 a 2 das hbiles para completar este proceso.  Los precios de los medicamentos varan con frecuencia dependiendo del lugar de dnde se surte la receta y alguna farmacias pueden ofrecer precios ms baratos.  El sitio web www.goodrx.com tiene cupones para medicamentos de diferentes farmacias. Los precios aqu no tienen en cuenta lo que podra costar con la ayuda del seguro (puede ser ms barato con su seguro), pero el sitio web puede darle el precio si no utiliz ningn seguro.  - Puede imprimir el cupn correspondiente y llevarlo con su receta a la farmacia.  - Tambin puede pasar por nuestra oficina durante el horario de atencin regular y recoger una tarjeta de cupones de GoodRx.  - Si necesita que su receta se enve electrnicamente a una farmacia diferente, informe a nuestra oficina a travs de MyChart de [place]   o por telfono llamando al [phone] y presione la opcin 4.  

## 2022-08-08 ENCOUNTER — Ambulatory Visit
Admission: RE | Admit: 2022-08-08 | Discharge: 2022-08-08 | Disposition: A | Discharge status: Home / Self Care | Payer: Commercial Managed Care - PPO | Source: Ambulatory Visit | Attending: Otolaryngology | Admitting: Otolaryngology

## 2022-08-08 ENCOUNTER — Ambulatory Visit: Payer: Commercial Managed Care - PPO | Admitting: Physician Assistant

## 2022-08-08 DIAGNOSIS — H9012 Conductive hearing loss, unilateral, left ear, with unrestricted hearing on the contralateral side: Secondary | ICD-10-CM

## 2022-08-08 DIAGNOSIS — H7292 Unspecified perforation of tympanic membrane, left ear: Secondary | ICD-10-CM

## 2022-08-10 ENCOUNTER — Other Ambulatory Visit: Payer: Self-pay | Admitting: Physician Assistant

## 2022-08-10 DIAGNOSIS — K219 Gastro-esophageal reflux disease without esophagitis: Secondary | ICD-10-CM

## 2022-08-12 ENCOUNTER — Encounter: Payer: Self-pay | Admitting: Dermatology

## 2022-08-21 DIAGNOSIS — H6692 Otitis media, unspecified, left ear: Secondary | ICD-10-CM | POA: Insufficient documentation

## 2022-09-02 DIAGNOSIS — L4050 Arthropathic psoriasis, unspecified: Secondary | ICD-10-CM | POA: Insufficient documentation

## 2022-09-02 DIAGNOSIS — Z7960 Long term (current) use of unspecified immunomodulators and immunosuppressants: Secondary | ICD-10-CM | POA: Insufficient documentation

## 2022-09-03 ENCOUNTER — Other Ambulatory Visit: Payer: Self-pay | Admitting: Psychiatry

## 2022-09-03 DIAGNOSIS — F411 Generalized anxiety disorder: Secondary | ICD-10-CM

## 2022-09-03 DIAGNOSIS — F4010 Social phobia, unspecified: Secondary | ICD-10-CM

## 2022-09-03 DIAGNOSIS — F439 Reaction to severe stress, unspecified: Secondary | ICD-10-CM

## 2022-09-24 ENCOUNTER — Ambulatory Visit (INDEPENDENT_AMBULATORY_CARE_PROVIDER_SITE_OTHER): Payer: Commercial Managed Care - PPO | Admitting: Psychiatry

## 2022-09-24 ENCOUNTER — Encounter: Payer: Self-pay | Admitting: Psychiatry

## 2022-09-24 VITALS — BP 126/87 | HR 102 | Temp 98.2°F | Ht 63.0 in | Wt 183.8 lb

## 2022-09-24 DIAGNOSIS — F17200 Nicotine dependence, unspecified, uncomplicated: Secondary | ICD-10-CM | POA: Diagnosis not present

## 2022-09-24 DIAGNOSIS — F411 Generalized anxiety disorder: Secondary | ICD-10-CM

## 2022-09-24 DIAGNOSIS — F4010 Social phobia, unspecified: Secondary | ICD-10-CM | POA: Diagnosis not present

## 2022-09-24 DIAGNOSIS — G4701 Insomnia due to medical condition: Secondary | ICD-10-CM

## 2022-09-24 MED ORDER — VENLAFAXINE HCL ER 75 MG PO CP24: 75.0000 mg | ORAL_CAPSULE | Freq: Every day | ORAL | 0 refills | Status: DC

## 2022-09-24 NOTE — Progress Notes (Unsigned)
BH MD OP Progress Note  09/24/2022 10:09 AM Savannah [name]  MRN:  [id number]  Chief Complaint:  Chief Complaint  Patient presents with   Follow-up   Anxiety   Depression   Medication Refill   HPI: Savannah [name] is a 37 year old Caucasian female history of GAD, tobacco use disorder, vitamin D deficiency ( recently diagnosed) hyperlipidemia, recent tympanoplasty was evaluated in office today.  Patient today reports she recently had to undergo tympanoplasty, this was at [facility], 09/08/2022.  Patient currently recovering from the surgery, left ear.  She continues to have hearing loss , vertigo on and off.  She took time off 2 weeks from her work and is  currently back at work since the past 3 days.  She reports she does have a good support system at work that has helped.  She however reports in spite of that she has been having trouble at work especially with certain colleagues that she does not get along with as well as the fact that she cannot hear from the left ear, and it does contributes to her anxiety.    Patient also struggles with sleep since she had to take time off from work for 2 weeks to recover from surgery and was staying awake during the day at that time.  Now that she is back on night shift at work it has been hard for her to switch back to sleep during the day.  She is not interested in trial of sleep medication or would like to try over the  counter medications like melatonin.  Patient denies any suicidal or perceptual disturbances.  Patient has been noncompliant with psychotherapy, however agreeable to reach out to her therapist.  Currently compliant on venlafaxine, denies side effects.  Patient denies any other concerns today.  Visit Diagnosis:    ICD-10-CM   1. GAD (generalized anxiety disorder)  F41.1 venlafaxine XR (EFFEXOR XR) 75 MG 24 hr capsule    2. Social anxiety disorder  F40.10 venlafaxine XR (EFFEXOR XR) 75 MG 24 hr capsule    3. Insomnia due to  medical condition  G47.01    mood, sleep hygiene    4. Tobacco use disorder  F17.200       Past Psychiatric History: I have reviewed past psychiatric history from progress note on 06/04/2021.  Past trials of sertraline-headaches, lorazepam-side effects, Klonopin-side effect, melatonin-did not work, Paramedic  Past Medical History:  Past Medical History:  Diagnosis Date   Anxiety    Depression    Eardrum rupture, left    Headache    Psoriatic arthritis (HCC)     Past Surgical History:  Procedure Laterality Date   CARPAL TUNNEL RELEASE Right 09/29/2014   Procedure: CARPAL TUNNEL RELEASE;  Surgeon: Myra Rude, MD;  Location: [facility] ORS;  Service: Orthopedics;  Laterality: Right;   CESAREAN SECTION     IUD placement     MYRINGOTOMY WITH TUBE PLACEMENT      Family Psychiatric History: I have reviewed family psychiatric history from progress note on 06/04/2021.  Family History:  Family History  Problem Relation Age of Onset   Healthy Mother    Bone cancer Maternal Grandfather    Mental illness Neg Hx     Social History: I have reviewed social history from progress note on 06/04/2021. Social History   Socioeconomic History   Marital status: Married    Spouse name: katherine   Number of children: 3   Years of education: Not on file  Highest education level: High school graduate  Occupational History   Not on file  Tobacco Use   Smoking status: Some Days    Packs/day: .25    Types: Cigarettes    Last attempt to quit: 06/14/2018    Years since quitting: 4.2    Passive exposure: Current   Smokeless tobacco: Never   Tobacco comments:    5-6 cigarettes daily  Vaping Use   Vaping Use: Never used  Substance and Sexual Activity   Alcohol use: Not Currently   Drug use: No   Sexual activity: Yes    Birth control/protection: I.U.D.  Other Topics Concern   Not on file  Social History Narrative   Not on file   Social Determinants of Health   Financial  Resource Strain: Not on file  Food Insecurity: Not on file  Transportation Needs: Not on file  Physical Activity: Inactive (03/26/2017)   Exercise Vital Sign    Days of Exercise per Week: 0 days    Minutes of Exercise per Session: 0 min  Stress: Stress Concern Present (03/26/2017)   [employer] of Occupational Health - Occupational Stress Questionnaire    Feeling of Stress : To some extent  Social Connections: Somewhat Isolated (03/26/2017)   Social Connection and Isolation Panel [NHANES]    Frequency of Communication with Friends and Family: Three times a week    Frequency of Social Gatherings with Friends and Family: Once a week    Attends Religious Services: Never    Database administrator or Organizations: No    Attends Banker Meetings: Never    Marital Status: Living with partner    Allergies:  Allergies  Allergen Reactions   Ibuprofen Other (See Comments)    Burns stomach    Metabolic Disorder Labs: No results found for: "HGBA1C", "MPG" No results found for: "PROLACTIN" Lab Results  Component Value Date   CHOL 200 (H) 02/21/2022   TRIG 263 (H) 02/21/2022   HDL 29 (L) 02/21/2022   LDLCALC 124 (H) 02/21/2022   LDLCALC 145 (H) 01/25/2021   Lab Results  Component Value Date   TSH 2.670 02/21/2022   TSH 2.120 01/25/2021    Therapeutic Level Labs: No results found for: "LITHIUM" No results found for: "VALPROATE" No results found for: "CBMZ"  Current Medications: Current Outpatient Medications  Medication Sig Dispense Refill   levonorgestrel (MIRENA) 20 MCG/24HR IUD 1 each once by Intrauterine route.     modafinil (PROVIGIL) 200 MG tablet TAKE 1 TABLET BY MOUTH EVERY DAY 30 tablet 2   omeprazole (PRILOSEC) 40 MG capsule TAKE 1 CAPSULE (40 MG TOTAL) BY MOUTH DAILY. 30 capsule 1   Roflumilast (ZORYVE) 0.3 % CREA Apply to aa's psoriasis QHS. 60 g 5   rosuvastatin (CRESTOR) 5 MG tablet TAKE 1 TABLET (5 MG TOTAL) BY MOUTH DAILY. 30 tablet 3    triamcinolone (KENALOG) 0.025 % cream Apply 1 Application topically 2 (two) times daily.     venlafaxine XR (EFFEXOR XR) 75 MG 24 hr capsule Take 1 capsule (75 mg total) by mouth daily with breakfast. 90 capsule 0   Vitamin D, Ergocalciferol, (DRISDOL) 1.25 MG (50000 UNIT) CAPS capsule TAKE 1 CAPSULE BY MOUTH ONE TIME PER WEEK 4 capsule 3   No current facility-administered medications for this visit.     Musculoskeletal: Strength & Muscle Tone: within normal limits Gait & Station: normal Patient leans: N/A  Psychiatric Specialty Exam: Review of Systems  Eyes:  Ear fullness , hearing loss , S/P surgery , left side  Psychiatric/Behavioral:  Positive for sleep disturbance. The patient is nervous/anxious.   All other systems reviewed and are negative.   Blood pressure 126/87, pulse (!) 102, temperature 98.2 F (36.8 C), temperature source Oral, height 5\' 3"  (1.6 m), weight 183 lb 12.8 oz (83.4 kg).Body mass index is 32.56 kg/m.  General Appearance: Casual  Eye Contact:  Fair  Speech:  Congruent  Volume:  Normal  Mood:  Anxious  Affect:  Congruent  Thought Process:  Goal Directed and Descriptions of Associations: Intact  Orientation:  Full (Time, Place, and Person)  Thought Content: Logical   Suicidal Thoughts:  No  Homicidal Thoughts:  No  Memory:  Immediate;   Fair Recent;   Fair Remote;   Fair  Judgement:  Fair  Insight:  Fair  Psychomotor Activity:  Normal  Concentration:  Concentration: Fair and Attention Span: Fair  Recall:  [employer] of Knowledge: Fair  Language: Fair  Akathisia:  No  Handed:  Right  AIMS (if indicated): not done  Assets:  Communication Skills Desire for Improvement Housing Social Support  ADL's:  Intact  Cognition: WNL  Sleep:  Poor   Screenings: Geneticist, molecular Office Visit from 12/23/2021 in [facility] Office Visit from 10/18/2021 in [facility]  Office Visit from 09/05/2021 in [facility]  AIMS Total Score 0 0 0      GAD-7    Flowsheet Row Office Visit from 09/24/2022 in [facility] Office Visit from 06/23/2022 in [facility] Office Visit from 03/24/2022 in [facility] Office Visit from 12/23/2021 in [facility] Office Visit from 10/18/2021 in [facility]  Total GAD-7 Score 11 5 3 12 4       PHQ2-9    Flowsheet Row Office Visit from 09/24/2022 in [facility] Office Visit from 06/23/2022 in [facility] Office Visit from 03/24/2022 in [facility] Office Visit from 12/23/2021 in [facility] Office Visit from 10/18/2021 in [facility]  PHQ-2 Total Score 3 2 1 2 2   PHQ-9 Total Score 12 8 -- 6 6      Flowsheet Row Office Visit from 09/24/2022 in [facility] Office Visit from 06/23/2022 in [facility] Office Visit from 03/24/2022 in [facility]  C-SSRS RISK CATEGORY No Risk No Risk No Risk        Assessment and Plan: Savannah [name] is a 37 year old Caucasian female, married, lives in [place], has a history of GAD, social anxiety disorder, insomnia, hyperlipidemia, vitamin D deficiency, recent tympanoplasty of left ear-09/08/2022, presented with worsening anxiety, sleep problems, will benefit from the following plan.  Plan GAD-unstable Increase venlafaxine extended release to 75 mg p.o. daily with breakfast Patient advised to follow-up with Ms. Christina Hussami for CBT, encouraged  to schedule an appointment.  Social anxiety disorder-improving Continue CBT  Insomnia-unstable Patient reports she is currently struggling with sleep due to recent changes in her sleep schedule since she was out of work.  She slept through the day and now she is back at work and is having difficulty changing her sleep schedule again.  Patient to work on sleep hygiene techniques. Continue modafinil as prescribed by her provider. Multiple sleep studies in the past-CPAP was not recommended. Discussed starting a sleep medication, patient not interested.  She would like to try melatonin over-the-counter.  Tobacco use disorder - Improving Monitor closely.  Follow-up in clinic in 8 weeks or sooner if needed.     Collaboration of Care: Collaboration of Care: Referral or follow-up with counselor/therapist AEB patient encouraged to follow up with therapist.  I have reviewed notes per Dr.Dedmon -patient is status post tympanoplasty dated 09/08/2022.  Patient/Guardian was advised Release of Information must be obtained prior to any record release in order to collaborate their care with an outside provider. Patient/Guardian was advised if they have not already done so to contact the registration department to sign all necessary forms in order for [place] to release information regarding their care.   Consent: Patient/Guardian gives verbal consent for treatment and assignment of benefits for services provided during this visit. Patient/Guardian expressed understanding and agreed to proceed.   This note was generated in part or whole with voice recognition software. Voice recognition is usually quite accurate but there are transcription errors that can and very often do occur. I apologize for any typographical errors that were not detected and corrected.    Jomarie Longs, MD 09/25/2022, 8:49 AM

## 2022-10-04 ENCOUNTER — Other Ambulatory Visit: Payer: Self-pay | Admitting: Physician Assistant

## 2022-10-04 DIAGNOSIS — K219 Gastro-esophageal reflux disease without esophagitis: Secondary | ICD-10-CM

## 2022-10-07 ENCOUNTER — Telehealth: Payer: Self-pay | Admitting: Nurse Practitioner

## 2022-10-07 NOTE — Telephone Encounter (Signed)
Lvm to move 10/23/22 appointment. 

## 2022-10-08 ENCOUNTER — Telehealth: Payer: Self-pay | Admitting: Nurse Practitioner

## 2022-10-08 NOTE — Telephone Encounter (Signed)
Left another vm and sent message to move 10/23/22 appointment-Toni 

## 2022-10-16 ENCOUNTER — Other Ambulatory Visit: Payer: Self-pay | Admitting: Physician Assistant

## 2022-10-22 ENCOUNTER — Encounter: Payer: Self-pay | Admitting: Nurse Practitioner

## 2022-10-22 ENCOUNTER — Ambulatory Visit (INDEPENDENT_AMBULATORY_CARE_PROVIDER_SITE_OTHER): Payer: Commercial Managed Care - PPO | Admitting: Nurse Practitioner

## 2022-10-22 VITALS — BP 120/78 | HR 99 | Temp 98.5°F | Resp 16 | Ht 63.0 in | Wt 186.2 lb

## 2022-10-22 DIAGNOSIS — G4726 Circadian rhythm sleep disorder, shift work type: Secondary | ICD-10-CM

## 2022-10-22 MED ORDER — MODAFINIL 200 MG PO TABS
200.0000 mg | ORAL_TABLET | Freq: Every day | ORAL | 2 refills | Status: DC
Start: 2022-10-22 — End: 2023-04-30

## 2022-10-22 NOTE — Progress Notes (Signed)
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 She has been taking provigil at work and this does seem to help. Sometimes does not feel it lasts as long. She does note there is a difference whe she does not take the provigil. she is less foggy and more alert with the provigil.  Working day shift is still not an option for her.    Current Medication: Outpatient Encounter Medications as of 10/22/2022  Medication Sig   levonorgestrel (MIRENA) 20 MCG/24HR IUD 1 each once by Intrauterine route.   omeprazole (PRILOSEC) 40 MG capsule TAKE 1 CAPSULE (40 MG TOTAL) BY MOUTH DAILY.   Roflumilast (ZORYVE) 0.3 % CREA Apply to aa's psoriasis QHS.   rosuvastatin (CRESTOR) 5 MG tablet TAKE 1 TABLET (5 MG TOTAL) BY MOUTH DAILY.   triamcinolone (KENALOG) 0.025 % cream Apply 1 Application topically 2 (two) times daily.   venlafaxine XR (EFFEXOR XR) 75 MG 24 hr capsule Take 1 capsule (75 mg total) by mouth daily with breakfast.   Vitamin D, Ergocalciferol, (DRISDOL) 1.25 MG (50000 UNIT) CAPS capsule TAKE 1 CAPSULE BY MOUTH ONE TIME PER WEEK   [DISCONTINUED] modafinil (PROVIGIL) 200 MG tablet TAKE 1 TABLET BY MOUTH EVERY DAY   modafinil (PROVIGIL) 200 MG tablet Take 1 tablet (200 mg total) by mouth daily.   No facility-administered encounter medications on file as of 10/22/2022.    Surgical History: Past Surgical History:  Procedure Laterality Date   CARPAL TUNNEL RELEASE Right 09/29/2014   Procedure: CARPAL TUNNEL RELEASE;  Surgeon: Myra Rude, MD;  Location: [facility] ORS;  Service: Orthopedics;  Laterality: Right;   CESAREAN SECTION     IUD placement     MYRINGOTOMY WITH TUBE PLACEMENT      Medical History: Past Medical History:  Diagnosis Date   Anxiety     Depression    Eardrum rupture, left    Headache    Psoriatic arthritis (HCC)     Family History: Family History  Problem Relation Age of Onset   Healthy Mother    Bone cancer Maternal Grandfather    Mental illness Neg Hx     Social History   Socioeconomic History   Marital status: Married    Spouse name: katherine   Number of children: 3   Years of education: Not on file   Highest education level: High school graduate  Occupational History   Not on file  Tobacco Use   Smoking status: Some Days    Packs/day: .25    Types: Cigarettes    Last attempt to quit: 06/14/2018    Years since quitting: 4.3    Passive exposure: Current   Smokeless tobacco: Never   Tobacco comments:    5-6 cigarettes daily  Vaping Use   Vaping Use: Never used  Substance and Sexual Activity   Alcohol use: Not Currently   Drug use: No   Sexual activity: Yes    Birth control/protection: I.U.D.  Other Topics Concern   Not on file  Social History Narrative   Not on file   Social Determinants of Health   Financial Resource Strain: Not on file  Food Insecurity: Not on file  Transportation Needs: Not on file  Physical Activity: Inactive (03/26/2017)   Exercise Vital  Sign    Days of Exercise per Week: 0 days    Minutes of Exercise per Session: 0 min  Stress: Stress Concern Present (03/26/2017)   [employer] of Occupational Health - Occupational Stress Questionnaire    Feeling of Stress : To some extent  Social Connections: Somewhat Isolated (03/26/2017)   Social Connection and Isolation Panel [NHANES]    Frequency of Communication with Friends and Family: Three times a week    Frequency of Social Gatherings with Friends and Family: Once a week    Attends Religious Services: Never    Database administrator or Organizations: No    Attends Banker Meetings: Never    Marital Status: Living with partner  Intimate Partner Violence: Not At Risk (03/26/2017)   Humiliation,  Afraid, Rape, and Kick questionnaire    Fear of Current or Ex-Partner: No    Emotionally Abused: No    Physically Abused: No    Sexually Abused: No      Review of Systems  Constitutional:  Positive for fatigue. Negative for appetite change and unexpected weight change.  HENT: Negative.    Respiratory: Negative.  Negative for cough, chest tightness, shortness of breath and wheezing.   Cardiovascular: Negative.  Negative for chest pain and palpitations.  Gastrointestinal: Negative.   Genitourinary: Negative.   Musculoskeletal: Negative.   Psychiatric/Behavioral:  Positive for sleep disturbance. Negative for self-injury and suicidal ideas. The patient is nervous/anxious.     Vital Signs: BP 120/78   Pulse 99   Temp 98.5 F (36.9 C)   Resp 16   Ht 5\' 3"  (1.6 m)   Wt 186 lb 3.2 oz (84.5 kg)   SpO2 95%   BMI 32.98 kg/m    Physical Exam Vitals reviewed.  Constitutional:      General: She is not in acute distress.    Appearance: Normal appearance. She is obese. She is not ill-appearing.  HENT:     Head: Normocephalic and atraumatic.  Eyes:     Pupils: Pupils are equal, round, and reactive to light.  Cardiovascular:     Rate and Rhythm: Normal rate and regular rhythm.  Pulmonary:     Effort: Pulmonary effort is normal. No respiratory distress.  Neurological:     Mental Status: She is alert and oriented to person, place, and time.  Psychiatric:        Mood and Affect: Mood normal.        Behavior: Behavior normal.        Assessment/Plan: 1. Circadian rhythm sleep disorder, shift work type Continue modafinil as prescribed. continue with working on her sleep hygiene. Primary issue is the impaired circadian rhythm from work shift work. She does feel better but is not 100%. Getting a job during the daytime is not an option  - modafinil (PROVIGIL) 200 MG tablet; Take 1 tablet (200 mg total) by mouth daily.  Dispense: 30 tablet; Refill: 2  2. Shift work sleep  disorder Continue modafinil as prescribed. Work on Physiological scientist.    General Counseling: ramani senn understanding of the findings of todays visit and agrees with plan of treatment. I have discussed any further diagnostic evaluation that may be needed or ordered today. We also reviewed her medications today. she has been encouraged to call the office with any questions or concerns that should arise related to todays visit.    No orders of the defined types were placed in this encounter.   Meds ordered this encounter  Medications   modafinil (PROVIGIL) 200 MG tablet    Sig: Take 1 tablet (200 mg total) by mouth daily.    Dispense:  30 tablet    Refill:  2    Not to exceed 3 additional fills before 08/06/2022    Return in about 1 year (around 10/22/2023) for F/U, pulmonary/sleep, Nadra Hritz or DSK.   Total time spent:20 Minutes Time spent includes review of chart, medications, test results, and follow up plan with the patient.   [place] Controlled Substance Database was reviewed by me.  This patient was seen by Sallyanne Kuster, FNP-C in collaboration with Dr. Beverely Risen as a part of collaborative care agreement.   Brodi Nery R. Tedd Sias, MSN, FNP-C Internal medicine

## 2022-10-23 ENCOUNTER — Ambulatory Visit: Payer: Commercial Managed Care - PPO | Admitting: Nurse Practitioner

## 2022-10-29 ENCOUNTER — Other Ambulatory Visit: Payer: Self-pay | Admitting: Physician Assistant

## 2022-11-06 ENCOUNTER — Ambulatory Visit: Payer: Commercial Managed Care - PPO | Admitting: Dermatology

## 2022-11-20 ENCOUNTER — Ambulatory Visit (INDEPENDENT_AMBULATORY_CARE_PROVIDER_SITE_OTHER): Payer: Commercial Managed Care - PPO | Admitting: Psychiatry

## 2022-11-20 ENCOUNTER — Encounter: Payer: Self-pay | Admitting: Physician Assistant

## 2022-11-20 ENCOUNTER — Encounter: Payer: Self-pay | Admitting: Psychiatry

## 2022-11-20 VITALS — BP 136/95 | HR 89 | Temp 98.2°F | Ht 63.0 in | Wt 177.6 lb

## 2022-11-20 DIAGNOSIS — F17200 Nicotine dependence, unspecified, uncomplicated: Secondary | ICD-10-CM | POA: Diagnosis not present

## 2022-11-20 DIAGNOSIS — F411 Generalized anxiety disorder: Secondary | ICD-10-CM

## 2022-11-20 DIAGNOSIS — F4010 Social phobia, unspecified: Secondary | ICD-10-CM | POA: Diagnosis not present

## 2022-11-20 DIAGNOSIS — G4701 Insomnia due to medical condition: Secondary | ICD-10-CM | POA: Diagnosis not present

## 2022-11-20 MED ORDER — ZOLPIDEM TARTRATE 5 MG PO TABS
2.5000 mg | ORAL_TABLET | Freq: Every evening | ORAL | 0 refills | Status: DC | PRN
Start: 2022-11-20 — End: 2023-02-20

## 2022-11-20 NOTE — Progress Notes (Signed)
BH MD OP Progress Note  11/20/2022 9:29 AM Savannah [name]  MRN:  [id number]  Chief Complaint:  Chief Complaint  Patient presents with   Follow-up   Depression   Anxiety   HPI: Savannah [name] is a 37 year old Caucasian female, who has a history of GAD, tobacco use disorder, vitamin D deficiency, hyperlipidemia, recent tympanoplasty, psoriatic arthritis, was evaluated in office today.  Patient reports she is currently struggling with myalgia, joint pain from her psoriasis.  She was provided medication by rheumatology-Dr. Ottis Stain, reports she is waiting for the medication to take effect.  She had to take half a day off from work yesterday.  It has been very difficult for her to function due to her pain.  She is motivated to follow-up with her rheumatologist on a regular basis.  Patient today reports she is anxious about her health.  She also has episodes of feeling irritable or sad.  However since being on the higher dosage of venlafaxine she feels she is coping [name].  She does struggle with sleep mostly because of her pain.  Agreeable to trial of Ambien.  Patient reports she does not remember the last time she had an appointment with Trula Ore her therapist.  Agreeable to follow-up.  Patient denies any suicidality, homicidality or perceptual disturbances.  Patient reports good support system from her family.  Reports she is interested in smoking cessation, receptive to counseling.  Patient denies any other concerns today.  Visit Diagnosis:    ICD-10-CM   1. GAD (generalized anxiety disorder)  F41.1     2. Social anxiety disorder  F40.10     3. Insomnia due to medical condition  G47.01 zolpidem (AMBIEN) 5 MG tablet   pain    4. Tobacco use disorder  F17.200       Past Psychiatric History: I have reviewed past psychiatric history from progress note on 06/04/2021.  Past trials of sertraline-headaches, lorazepam-side effect, Klonopin-side effect, melatonin-did not work,  Cymbalta-side effect.  Past Medical History:  Past Medical History:  Diagnosis Date   Anxiety    Depression    Eardrum rupture, left    Headache    Psoriatic arthritis (HCC)     Past Surgical History:  Procedure Laterality Date   CARPAL TUNNEL RELEASE Right 09/29/2014   Procedure: CARPAL TUNNEL RELEASE;  Surgeon: Myra Rude, MD;  Location: [facility] ORS;  Service: Orthopedics;  Laterality: Right;   CESAREAN SECTION     IUD placement     MYRINGOTOMY WITH TUBE PLACEMENT      Family Psychiatric History: I have reviewed family psychiatric history from progress note on 06/04/2021.  Family History:  Family History  Problem Relation Age of Onset   Healthy Mother    Bone cancer Maternal Grandfather    Mental illness Neg Hx     Social History: I have reviewed social history from progress note on 06/04/2021. Social History   Socioeconomic History   Marital status: Married    Spouse name: katherine   Number of children: 3   Years of education: Not on file   Highest education level: High school graduate  Occupational History   Not on file  Tobacco Use   Smoking status: Some Days    Current packs/day: 0.00    Types: Cigarettes    Last attempt to quit: 06/14/2018    Years since quitting: 4.4    Passive exposure: Current   Smokeless tobacco: Never   Tobacco comments:    5-6 cigarettes daily  Vaping Use  Vaping status: Never Used  Substance and Sexual Activity   Alcohol use: Not Currently   Drug use: No   Sexual activity: Yes    Birth control/protection: I.U.D.  Other Topics Concern   Not on file  Social History Narrative   Not on file   Social Determinants of Health   Financial Resource Strain: Not on file  Food Insecurity: Not on file  Transportation Needs: Not on file  Physical Activity: Inactive (03/26/2017)   Exercise Vital Sign    Days of Exercise per Week: 0 days    Minutes of Exercise per Session: 0 min  Stress: Stress Concern Present (03/26/2017)    [employer] of Occupational Health - Occupational Stress Questionnaire    Feeling of Stress : To some extent  Social Connections: Somewhat Isolated (03/26/2017)   Social Connection and Isolation Panel [NHANES]    Frequency of Communication with Friends and Family: Three times a week    Frequency of Social Gatherings with Friends and Family: Once a week    Attends Religious Services: Never    Database administrator or Organizations: No    Attends Banker Meetings: Never    Marital Status: Living with partner    Allergies:  Allergies  Allergen Reactions   Ibuprofen Other (See Comments)    Burns stomach    Metabolic Disorder Labs: No results found for: "HGBA1C", "MPG" No results found for: "PROLACTIN" Lab Results  Component Value Date   CHOL 200 (H) 02/21/2022   TRIG 263 (H) 02/21/2022   HDL 29 (L) 02/21/2022   LDLCALC 124 (H) 02/21/2022   LDLCALC 145 (H) 01/25/2021   Lab Results  Component Value Date   TSH 2.670 02/21/2022   TSH 2.120 01/25/2021    Therapeutic Level Labs: No results found for: "LITHIUM" No results found for: "VALPROATE" No results found for: "CBMZ"  Current Medications: Current Outpatient Medications  Medication Sig Dispense Refill   guselkumab (TREMFYA) 100 MG/ML pen Inject 100 mLs into the skin once a week.     levonorgestrel (MIRENA) 20 MCG/24HR IUD 1 each once by Intrauterine route.     modafinil (PROVIGIL) 200 MG tablet Take 1 tablet (200 mg total) by mouth daily. 30 tablet 2   omeprazole (PRILOSEC) 40 MG capsule TAKE 1 CAPSULE (40 MG TOTAL) BY MOUTH DAILY. 30 capsule 1   Roflumilast (ZORYVE) 0.3 % CREA Apply to aa's psoriasis QHS. 60 g 5   rosuvastatin (CRESTOR) 5 MG tablet TAKE 1 TABLET (5 MG TOTAL) BY MOUTH DAILY. 30 tablet 3   triamcinolone (KENALOG) 0.025 % cream Apply 1 Application topically 2 (two) times daily.     venlafaxine XR (EFFEXOR XR) 75 MG 24 hr capsule Take 1 capsule (75 mg total) by mouth daily with breakfast.  90 capsule 0   Vitamin D, Ergocalciferol, (DRISDOL) 1.25 MG (50000 UNIT) CAPS capsule TAKE 1 CAPSULE BY MOUTH ONE TIME PER WEEK (Patient taking differently: Take 50,000 Units by mouth every 7 (seven) days.) 4 capsule 3   zolpidem (AMBIEN) 5 MG tablet Take 0.5-1 tablets (2.5-5 mg total) by mouth at bedtime as needed for sleep. 30 tablet 0   No current facility-administered medications for this visit.     Musculoskeletal: Strength & Muscle Tone: within normal limits Gait & Station: normal Patient leans: N/A  Psychiatric Specialty Exam: Review of Systems  Musculoskeletal:  Positive for myalgias.  Psychiatric/Behavioral:  Positive for sleep disturbance. The patient is nervous/anxious.     Blood pressure (!) 136/95,  pulse 89, temperature 98.2 F (36.8 C), temperature source Skin, height 5\' 3"  (1.6 m), weight 177 lb 9.6 oz (80.6 kg).Body mass index is 31.46 kg/m.  General Appearance: Casual  Eye Contact:  Fair  Speech:  Clear and Coherent  Volume:  Normal  Mood:  Anxious  Affect:  Congruent  Thought Process:  Goal Directed and Descriptions of Associations: Intact  Orientation:  Full (Time, Place, and Person)  Thought Content: Logical   Suicidal Thoughts:  No  Homicidal Thoughts:  No  Memory:  Immediate;   Fair Recent;   Fair Remote;   Fair  Judgement:  Fair  Insight:  Fair  Psychomotor Activity:  Normal  Concentration:  Concentration: Fair and Attention Span: Fair  Recall:  [employer] of Knowledge: Fair  Language: Fair  Akathisia:  No  Handed:  Right  AIMS (if indicated): not done  Assets:  Communication Skills Desire for Improvement Housing Intimacy Social Support Talents/Skills Vocational/Educational  ADL's:  Intact  Cognition: WNL  Sleep:  Poor   Screenings: Geneticist, molecular Office Visit from 12/23/2021 in [facility] Office Visit from 10/18/2021 in [facility] Office Visit from  09/05/2021 in [facility]  AIMS Total Score 0 0 0      GAD-7    Flowsheet Row Office Visit from 11/20/2022 in [facility] Office Visit from 09/24/2022 in [facility] Office Visit from 06/23/2022 in [facility] Office Visit from 03/24/2022 in [facility] Office Visit from 12/23/2021 in [facility]  Total GAD-7 Score 6 11 5 3 12       PHQ2-9    Flowsheet Row Office Visit from 11/20/2022 in [facility] Office Visit from 09/24/2022 in [facility] Office Visit from 06/23/2022 in [facility] Office Visit from 03/24/2022 in [facility] Office Visit from 12/23/2021 in [facility]  PHQ-2 Total Score 3 3 2 1 2   PHQ-9 Total Score 11 12 8  -- 6      Flowsheet Row Office Visit from 11/20/2022 in [facility] Office Visit from 09/24/2022 in [facility] Office Visit from 06/23/2022 in [facility]  C-SSRS RISK CATEGORY No Risk No Risk No Risk        Assessment and Plan: Amparo Donalson is a 37 year old Caucasian female, married, lives in [place], has a history of GAD, social anxiety disorder, insomnia, hyperlipidemia, vitamin D deficiency, recent tympanoplasty of left ear, psoriatic arthritis, was evaluated in office today.  Patient with anxiety, sleep problems mostly because of her pain, will benefit from continued follow-ups with her rheumatologist as well as discussed medication management as noted below.  Plan GAD-improving Venlafaxine extended  release 75 mg p.o. daily with breakfast Patient encouraged to follow up with her therapist Ms. Christina Hussami for CBT.  Social anxiety disorder-improving Continue CBT for  Insomnia-unstable Start zolpidem 2.5-5 mg p.o. nightly as needed Reviewed [place] PMP AWARxE Provided medication education including discussing complex behaviors. Continue modafinil as prescribed by her provider. Multiple sleep studies in the past-CPAP was not recommended.  Tobacco use disorder-improving Provided counseling for 1 minute.  Patient provided information for a sequined married program.  Follow-up in  clinic in 3 months or sooner if needed.   Collaboration of Care: Collaboration of Care: Referral or follow-up with counselor/therapist AEB encouraged to follow up with therapist as well as with rheumatology for pain management.  Patient/Guardian was advised Release of Information must be obtained prior to any record release in order to collaborate their care with an outside provider. Patient/Guardian was advised if they have not already done so to contact the registration department to sign all necessary forms in order for [place] to release information regarding their care.   Consent: Patient/Guardian gives verbal consent for treatment and assignment of benefits for services provided during this visit. Patient/Guardian expressed understanding and agreed to proceed.  This note was generated in part or whole with voice recognition software. Voice recognition is usually quite accurate but there are transcription errors that can and very often do occur. I apologize for any typographical errors that were not detected and corrected.     Jomarie Longs, MD 11/21/2022, 8:26 AM

## 2022-11-20 NOTE — Patient Instructions (Signed)
Zolpidem Tablets What is this medication? ZOLPIDEM (zole PI dem) treats insomnia. It helps you get to sleep faster and stay asleep throughout the night. It is often used for a short period of time. This medicine may be used for other purposes; ask your health care provider or pharmacist if you have questions. COMMON BRAND NAME(S): Ambien What should I tell my care team before I take this medication? They need to know if you have any of these conditions: Depression Frequently drink alcohol Liver disease Lung or breathing disease Myasthenia gravis Sleep apnea Substance use disorder Suicidal thoughts, plans, or attempt by you or a family member Unusual sleep behaviors or activities you do not remember An unusual or allergic reaction to zolpidem, other medications, foods, dyes, or preservatives Pregnant or trying to get pregnant Breastfeeding How should I use this medication? Take this medication by mouth with water. Take it as directed on the prescription label. It is better to take this medication on an empty stomach and only when you are ready for bed. Do not take your medication more often than directed. If you have been taking this medication for several weeks and suddenly stop taking it, you may get unpleasant withdrawal symptoms. Your care team may want to gradually reduce the dose. Do not stop taking this medication on your own. Always follow your care team's advice. A special MedGuide will be given to you by the pharmacist with each prescription and refill. Be sure to read this information carefully each time. Talk to your care team about the use of this medication in children. Special care may be needed. Overdosage: If you think you have taken too much of this medicine contact a poison control center or emergency room at once. NOTE: This medicine is only for you. Do not share this medicine with others. What if I miss a dose? This does not apply. This medication should only be taken  immediately before going to sleep. Do not take double or extra doses. What may interact with this medication? Alcohol Antihistamines for allergy, cough, and cold Certain medications for anxiety or sleep Certain medications for depression, such as amitriptyline, fluoxetine, sertraline Certain medications for fungal infections, such as ketoconazole and itraconazole Certain medications for seizures, such as phenobarbital, primidone Ciprofloxacin Dietary supplements for sleep, such as valerian or kava kava General anesthetics, such as halothane, isoflurane, methoxyflurane, propofol Local anesthetics, such as lidocaine, pramoxine, tetracaine Medications that relax muscles for surgery Opioid medications for pain Phenothiazines, such as chlorpromazine, mesoridazine, prochlorperazine, thioridazine Rifampin This list may not describe all possible interactions. Give your health care provider a list of all the medicines, herbs, non-prescription drugs, or dietary supplements you use. Also tell them if you smoke, drink alcohol, or use illegal drugs. Some items may interact with your medicine. What should I watch for while using this medication? Visit your care team for regular checks on your progress. Keep a regular sleep schedule by going to bed at about the same time each night. Avoid caffeine-containing drinks in the evening hours. When sleep medications are used every night for more than a few weeks, they may stop working. Talk to your care team if you still have trouble sleeping. You may do unusual sleep behaviors or activities you do not remember the day after taking this medication. Activities include driving, making or eating food, talking on the phone, sexual activity, or sleep walking. Stop taking this medication and call your care team right away if you find out you have done activities   like this. Plan to go to bed and stay in bed for a full night (7 to 8 hours) after you take this medication. You  may still be drowsy the morning after taking this medication. This medication may affect your coordination, reaction time, or judgment. Do not drive or operate machinery until you know how this medication affects you. Sit up or stand slowly to reduce the risk of dizzy or fainting spells. If you or your family notice any changes in your behavior, such as new or worsening depression, thoughts of harming yourself, anxiety, other unusual or disturbing thoughts, or memory loss, call your care team right away. After you stop taking this medication, you may have trouble falling asleep. This is called rebound insomnia. This problem usually goes away on its own after 1 or 2 nights. What side effects may I notice from receiving this medication? Side effects that you should report to your care team as soon as possible: Allergic reactions--skin rash, itching, hives, swelling of the face, lips, tongue, or throat Change in vision such as blurry vision, seeing halos around lights, vision loss CNS depression--slow or shallow breathing, shortness of breath, feeling faint, dizziness, confusion, difficulty staying awake Mood and behavior changes--anxiety, nervousness, confusion, hallucinations, irritability, hostility, thoughts of suicide or self-harm, worsening mood, feelings of depression Unusual sleep behaviors or activities you do not remember such as driving, eating, or sexual activity Side effects that usually do not require medical attention (report to your care team if they continue or are bothersome): Diarrhea Dizziness Drowsiness the day after use Headache This list may not describe all possible side effects. Call your doctor for medical advice about side effects. You may report side effects to FDA at 1-800-FDA-1088. Where should I keep my medication? Keep out of the reach of children and pets. This medication can be abused. Keep your medication in a safe place to protect it from theft. Do not share this  medication with anyone. Selling or giving away this medication is dangerous and against the law. Store at room temperature between 20 and 25 degrees C (68 and 77 degrees F). This medication may cause accidental overdose and death if taken by other adults, children, or pets. Mix any unused medication with a substance like cat litter or coffee grounds. Then throw the medication away in a sealed container like a sealed bag or a coffee can with a lid. Do not use the medication after the expiration date. NOTE: This sheet is a summary. It may not cover all possible information. If you have questions about this medicine, talk to your doctor, pharmacist, or health care provider.  2024 Elsevier/Gold Standard (2022-04-27 00:00:00)  

## 2022-12-11 ENCOUNTER — Other Ambulatory Visit: Payer: Self-pay | Admitting: Nurse Practitioner

## 2022-12-11 DIAGNOSIS — K219 Gastro-esophageal reflux disease without esophagitis: Secondary | ICD-10-CM

## 2022-12-29 ENCOUNTER — Other Ambulatory Visit: Payer: Self-pay | Admitting: Psychiatry

## 2022-12-29 DIAGNOSIS — F4010 Social phobia, unspecified: Secondary | ICD-10-CM

## 2022-12-29 DIAGNOSIS — F411 Generalized anxiety disorder: Secondary | ICD-10-CM

## 2022-12-31 ENCOUNTER — Ambulatory Visit: Payer: Commercial Managed Care - PPO | Admitting: Dermatology

## 2022-12-31 DIAGNOSIS — L4050 Arthropathic psoriasis, unspecified: Secondary | ICD-10-CM

## 2022-12-31 DIAGNOSIS — Z79899 Other long term (current) drug therapy: Secondary | ICD-10-CM

## 2022-12-31 DIAGNOSIS — L409 Psoriasis, unspecified: Secondary | ICD-10-CM

## 2022-12-31 DIAGNOSIS — Z7189 Other specified counseling: Secondary | ICD-10-CM

## 2022-12-31 MED ORDER — ZORYVE 0.3 % EX CREA: TOPICAL_CREAM | CUTANEOUS | 5 refills | Status: DC

## 2022-12-31 NOTE — Progress Notes (Signed)
   Follow-Up Visit   Subjective  Savannah [name] is a 37 y.o. female who presents for the following: Psoriasis 3 month psoriasis follow up. History of flares at  the hands, feet, arms, and legs. History of joint pain and has been seen by rheumatologist. Prescribed Tremfya 2 months ago and has been given 2 injections.  Has zoryve cream to use topically to active flares.   Reports some active scale today on right foot.    The following portions of the chart were reviewed this encounter and updated as appropriate: medications, allergies, medical history  Review of Systems:  No other skin or systemic complaints except as noted in HPI or Assessment and Plan.  Objective  Well appearing patient in no apparent distress; mood and affect are within normal limits.  Areas Examined: Face, hands, feet, and legs  Relevant exam findings are noted in the Assessment and Plan.    Assessment & Plan   Psoriasis  Related Medications Roflumilast (ZORYVE) 0.3 % CREA Apply to aa's psoriasis QHS.   PSORIASIS with PSORIATIC ARTHRITIS   hyperkeratotic plaque foot /heel - improved. mild scale elbows . 15 % BSA to 4 % BSA on Tremfya. Still has pain in joints but has improved.   Hx of flares at elbows, hands, left and right heel, left heel worst area.   Chronic and persistent condition with duration or expected duration over one year. Condition is symptomatic/ bothersome to patient. Not currently at goal. Much improved   Reviewed last visit with Dr. Allena Katz on 09/02/22 Plaque Psoriasis with Psoriatic Arthritis: Active -- Followed by [facility] -- Start Tremfya  -- Check Labs and Xray  Treatment Plan:  Continue Tremfya as prescribed by Dr.Patel  Continue Zoryve cream as needed for flares   May consider adding Vtama in future   Will recheck in 6 months for follow up  Counseling on psoriasis and coordination of care  psoriasis is a chronic non-curable, but treatable genetic/hereditary disease  that may have other systemic features affecting other organ systems such as joints (Psoriatic Arthritis). It is associated with an increased risk of inflammatory bowel disease, heart disease, non-alcoholic fatty liver disease, and depression.  Treatments include light and laser treatments; topical medications; and systemic medications including oral and injectables.  Reviewed risks of biologics including immunosuppression, infections, injection site reaction, and failure to improve condition. Goal is control of skin condition, not cure.  Some older biologics such as Humira and Enbrel may slightly increase risk of malignancy and may worsen congestive heart failure.  Taltz and Cosentyx may cause inflammatory bowel disease to flare. The use of biologics requires long term medication management, including periodic office visits and monitoring of blood work.   Return in about 6 months (around 07/03/2023) for psoriasis.  IAsher Muir, CMA, am acting as scribe for Armida Sans, MD.   Documentation: I have reviewed the above documentation for accuracy and completeness, and I agree with the above.  Armida Sans, MD

## 2022-12-31 NOTE — Patient Instructions (Addendum)
Your prescription was sent to [facility] in [place]. A representative from [facility] will contact you within 3 business hours to verify your address and insurance information to schedule a free delivery. If for any reason you do not receive a phone call from them, please reach out to them. Their phone number is [phone] and their hours are Monday-Friday 9:00 am-5:00 pm.      Due to recent changes in healthcare laws, you may see results of your pathology and/or laboratory studies on MyChart before the doctors have had a chance to review them. We understand that in some cases there may be results that are confusing or concerning to you. Please understand that not all results are received at the same time and often the doctors may need to interpret multiple results in order to provide you with the best plan of care or course of treatment. Therefore, we ask that you please give [place] 2 business days to thoroughly review all your results before contacting the office for clarification. Should we see a critical lab result, you will be contacted sooner.   If You Need Anything After Your Visit  If you have any questions or concerns for your doctor, please call our main line at [phone] and press option 4 to reach your doctor's medical assistant. If no one answers, please leave a voicemail as directed and we will return your call as soon as possible. Messages left after 4 pm will be answered the following business day.   You may also send [place] a message via MyChart. We typically respond to MyChart messages within 1-2 business days.  For prescription refills, please ask your pharmacy to contact our office. Our fax number is [phone].  If you have an urgent issue when the clinic is closed that cannot wait until the next business day, you can page your doctor at the number below.    Please note that while we do our best to be available for urgent issues outside of office hours, we are not  available 24/7.   If you have an urgent issue and are unable to reach [place], you may choose to seek medical care at your doctor's office, retail clinic, urgent care center, or emergency room.  If you have a medical emergency, please immediately call 911 or go to the emergency department.  Pager Numbers  - Dr. Gwen Pounds: [phone]  - Dr. Roseanne Reno: [phone]  - Dr. Katrinka Blazing: [phone]   In the event of inclement weather, please call our main line at [phone] for an update on the status of any delays or closures.  Dermatology Medication Tips: Please keep the boxes that topical medications come in in order to help keep track of the instructions about where and how to use these. Pharmacies typically print the medication instructions only on the boxes and not directly on the medication tubes.   If your medication is too expensive, please contact our office at [phone] option 4 or send [place] a message through MyChart.   We are unable to tell what your co-pay for medications will be in advance as this is different depending on your insurance coverage. However, we may be able to find a substitute medication at lower cost or fill out paperwork to get insurance to cover a needed medication.   If a prior authorization is required to get your medication covered by your insurance company, please allow [place] 1-2 business days to complete this process.  Drug prices often vary depending on where the prescription  is filled and some pharmacies may offer cheaper prices.  The website www.goodrx.com contains coupons for medications through different pharmacies. The prices here do not account for what the cost may be with help from insurance (it may be cheaper with your insurance), but the website can give you the price if you did not use any insurance.  - You can print the associated coupon and take it with your prescription to the pharmacy.  - You may also stop by our office during regular business hours  and pick up a GoodRx coupon card.  - If you need your prescription sent electronically to a different pharmacy, notify our office through [facility] or by phone at [phone] option 4.     Si Usted Necesita Algo Despus de Su Visita  Tambin puede enviarnos un mensaje a travs de Clinical cytogeneticist. Por lo general respondemos a los mensajes de MyChart en el transcurso de 1 a 2 das hbiles.  Para renovar recetas, por favor pida a su farmacia que se ponga en contacto con nuestra oficina. Annie Sable de fax es [place] [phone].  Si tiene un asunto urgente cuando la clnica est cerrada y que no puede esperar hasta el siguiente da hbil, puede llamar/localizar a su doctor(a) al nmero que aparece a continuacin.   Por favor, tenga en cuenta que aunque hacemos todo lo posible para estar disponibles para asuntos urgentes fuera del horario de [place], no estamos disponibles las 24 horas del da, los 7 [address]  [address] de la [place].   Si tiene un problema urgente y no puede comunicarse con nosotros, puede optar por buscar atencin mdica  en el consultorio de su doctor(a), en una clnica privada, en un centro de atencin urgente o en una sala de emergencias.  Si tiene Engineer, drilling, por favor llame inmediatamente al 911 o vaya a la sala de emergencias.  Nmeros de bper  - Dr. Gwen Pounds: [phone]  - Dra. Roseanne Reno: [phone]  - Dr. Katrinka Blazing: [phone]   En caso de inclemencias del tiempo, por favor llame a Lacy Duverney principal al [phone] para una actualizacin sobre el [place] de cualquier retraso o cierre.  Consejos para la medicacin en dermatologa: Por favor, guarde las cajas en las que vienen los medicamentos de uso tpico para ayudarle a seguir las instrucciones sobre dnde y cmo usarlos. Las farmacias generalmente imprimen las instrucciones del medicamento slo en las cajas y no directamente en los tubos del [place].   Si su medicamento es muy caro, por favor, pngase  en contacto con Rolm Gala llamando al [phone] y presione la opcin 4 o envenos un mensaje a travs de Clinical cytogeneticist.   No podemos decirle cul ser su copago por los medicamentos por adelantado ya que esto es diferente dependiendo de la cobertura de su seguro. Sin embargo, es posible que podamos encontrar un medicamento sustituto a Audiological scientist un formulario para que el seguro cubra el medicamento que se considera necesario.   Si se requiere una autorizacin previa para que su compaa de seguros [place] su medicamento, por favor permtanos de 1 a 2 das hbiles para completar [address]  Los precios de los medicamentos varan con frecuencia dependiendo del Environmental consultant de dnde se surte la receta y alguna farmacias pueden ofrecer precios ms baratos.  El sitio web www.goodrx.com tiene cupones para medicamentos de Health and safety inspector. Los precios aqu no tienen en cuenta lo que podra costar con la ayuda del seguro (puede ser ms barato con su seguro), pero el sitio web  puede darle el precio si no utiliz [employer].  - Puede imprimir el cupn correspondiente y llevarlo con su receta a la farmacia.  - Tambin puede pasar por nuestra oficina durante el horario de atencin regular y Education officer, museum una tarjeta de cupones de GoodRx.  - Si necesita que su receta se enve electrnicamente a una farmacia diferente, informe a nuestra oficina a travs de MyChart de [place] o por telfono llamando al [phone] y presione la opcin 4.

## 2023-01-08 ENCOUNTER — Encounter: Payer: Self-pay | Admitting: Dermatology

## 2023-02-06 ENCOUNTER — Telehealth: Payer: Self-pay | Admitting: Physician Assistant

## 2023-02-06 NOTE — Telephone Encounter (Signed)
Lvm & sent message to confirm 02/13/23 appointment-Toni

## 2023-02-08 ENCOUNTER — Other Ambulatory Visit: Payer: Self-pay | Admitting: Nurse Practitioner

## 2023-02-08 DIAGNOSIS — K219 Gastro-esophageal reflux disease without esophagitis: Secondary | ICD-10-CM

## 2023-02-13 ENCOUNTER — Ambulatory Visit: Payer: Commercial Managed Care - PPO | Admitting: Physician Assistant

## 2023-02-13 ENCOUNTER — Encounter: Payer: Self-pay | Admitting: Physician Assistant

## 2023-02-13 VITALS — BP 110/70 | HR 97 | Temp 98.0°F | Resp 16 | Ht 63.0 in | Wt 177.0 lb

## 2023-02-13 DIAGNOSIS — L409 Psoriasis, unspecified: Secondary | ICD-10-CM

## 2023-02-13 DIAGNOSIS — Z0001 Encounter for general adult medical examination with abnormal findings: Secondary | ICD-10-CM

## 2023-02-13 DIAGNOSIS — G4726 Circadian rhythm sleep disorder, shift work type: Secondary | ICD-10-CM

## 2023-02-13 NOTE — Progress Notes (Signed)
[facility] [address] [place], [place] [address]  Internal MEDICINE  Office Visit Note  Patient Name: Savannah [name]  [id number]  [id number]  Date of Service: 02/13/2023  Chief Complaint  Patient presents with   Annual Exam   Depression     HPI Pt is here for routine health maintenance examination -Doing well  -some random tighness in muscles and joint swelling. Seeing rheumatology -following with ENT soon for repeat scans to make sure surgery went well -psych next week, unable to take ambien  Current Medication: Outpatient Encounter Medications as of 02/13/2023  Medication Sig   guselkumab (TREMFYA) 100 MG/ML pen Inject 100 mLs into the skin once a week.   levonorgestrel (MIRENA) 20 MCG/24HR IUD 1 each once by Intrauterine route.   modafinil (PROVIGIL) 200 MG tablet Take 1 tablet (200 mg total) by mouth daily.   omeprazole (PRILOSEC) 40 MG capsule TAKE 1 CAPSULE (40 MG TOTAL) BY MOUTH DAILY.   Roflumilast (ZORYVE) 0.3 % CREA Apply to aa's psoriasis QHS.   rosuvastatin (CRESTOR) 5 MG tablet TAKE 1 TABLET (5 MG TOTAL) BY MOUTH DAILY.   triamcinolone (KENALOG) 0.025 % cream Apply 1 Application topically 2 (two) times daily.   venlafaxine XR (EFFEXOR-XR) 75 MG 24 hr capsule TAKE 1 CAPSULE BY MOUTH DAILY WITH BREAKFAST.   Vitamin D, Ergocalciferol, (DRISDOL) 1.25 MG (50000 UNIT) CAPS capsule TAKE 1 CAPSULE BY MOUTH ONE TIME PER WEEK (Patient taking differently: Take 50,000 Units by mouth every 7 (seven) days.)   zolpidem (AMBIEN) 5 MG tablet Take 0.5-1 tablets (2.5-5 mg total) by mouth at bedtime as needed for sleep.   No facility-administered encounter medications on file as of 02/13/2023.    Surgical History: Past Surgical History:  Procedure Laterality Date   CARPAL TUNNEL RELEASE Right 09/29/2014   Procedure: CARPAL TUNNEL RELEASE;  Surgeon: Myra Rude, MD;  Location: [facility] ORS;  Service: Orthopedics;  Laterality: Right;   CESAREAN SECTION     IUD  placement     MYRINGOTOMY WITH TUBE PLACEMENT      Medical History: Past Medical History:  Diagnosis Date   Anxiety    Depression    Eardrum rupture, left    Headache    Psoriatic arthritis (HCC)     Family History: Family History  Problem Relation Age of Onset   Healthy Mother    Bone cancer Maternal Grandfather    Mental illness Neg Hx       Review of Systems   Vital Signs: BP 110/70   Pulse 97   Temp 98 F (36.7 C)   Resp 16   Ht 5\' 3"  (1.6 m)   Wt 177 lb (80.3 kg)   SpO2 97%   BMI 31.35 kg/m    Physical Exam   LABS: No results found for this or any previous visit (from the past 2160 hour(s)).  Marland KitchenMAMM    Assessment/Plan:   General Counseling: Sherlonda verbalizes understanding of the findings of todays visit and agrees with plan of treatment. I have discussed any further diagnostic evaluation that may be needed or ordered today. We also reviewed her medications today. she has been encouraged to call the office with any questions or concerns that should arise related to todays visit.    Counseling:    No orders of the defined types were placed in this encounter.   No orders of the defined types were placed in this encounter.   This patient was seen by Lynn Ito, PA-C in collaboration  with Dr. Beverely Risen as a part of collaborative care agreement.  Total time spent:*** Minutes  Time spent includes review of chart, medications, test results, and follow up plan with the patient.     Lyndon Code, MD  Internal Medicine

## 2023-02-20 ENCOUNTER — Encounter: Payer: Self-pay | Admitting: Psychiatry

## 2023-02-20 ENCOUNTER — Telehealth: Payer: Commercial Managed Care - PPO | Admitting: Psychiatry

## 2023-02-20 DIAGNOSIS — F411 Generalized anxiety disorder: Secondary | ICD-10-CM | POA: Diagnosis not present

## 2023-02-20 DIAGNOSIS — F4010 Social phobia, unspecified: Secondary | ICD-10-CM | POA: Diagnosis not present

## 2023-02-20 DIAGNOSIS — F17210 Nicotine dependence, cigarettes, uncomplicated: Secondary | ICD-10-CM

## 2023-02-20 DIAGNOSIS — F17200 Nicotine dependence, unspecified, uncomplicated: Secondary | ICD-10-CM

## 2023-02-20 DIAGNOSIS — G4701 Insomnia due to medical condition: Secondary | ICD-10-CM

## 2023-02-20 NOTE — Progress Notes (Signed)
Virtual Visit via Video Note  I connected with Savannah [name] on 02/20/23 at 10:30 AM EDT by a video enabled telemedicine application and verified that I am speaking with the correct person using two identifiers.  Location Provider Location : [facility] Patient Location : Home  Participants: Patient , Wife, Provider    I discussed the limitations of evaluation and management by telemedicine and the availability of in person appointments. The patient expressed understanding and agreed to proceed.    I discussed the assessment and treatment plan with the patient. The patient was provided an opportunity to ask questions and all were answered. The patient agreed with the plan and demonstrated an understanding of the instructions.   The patient was advised to call back or seek an in-person evaluation if the symptoms worsen or if the condition fails to improve as anticipated.   BH MD OP Progress Note  02/20/2023 12:20 PM Savannah [name]  MRN:  [id number]  Chief Complaint:  Chief Complaint  Patient presents with   Follow-up   Anxiety   Depression   Medication Refill   HPI: Savannah [name] is a 37 year old Caucasian female who has a history of GAD, tobacco use disorder, social anxiety disorder, insomnia, vitamin D deficiency, hyperlipidemia, recent tympanoplasty, psoriatic arthritis was evaluated by telemedicine today.  Patient today appeared to be cheerful, pleasant in session.  She reports overall mood symptoms have improved.  She does continue to have myalgia, joint pain due to her psoriasis.  That does bother her on and off.  She reports otherwise she has been doing fairly well on the venlafaxine for her anxiety symptoms.  Denies side effects.  Sleep is restless when she has a lot of pain or myalgia.  She reports massaging has helped.  She tried the Ambien half tablet which was prescribed last visit.  However she slept the whole day after taking it.  She does not want to take it anymore  and is not interested in trial of another sleep aid at this time.  Patient denies any suicidality, homicidality or perceptual disturbances.  Reports wife continues to be supportive.  Denies any other concerns today.  Visit Diagnosis:    ICD-10-CM   1. GAD (generalized anxiety disorder)  F41.1     2. Social anxiety disorder  F40.10     3. Insomnia due to medical condition  G47.01    Pain    4. Tobacco use disorder  F17.200       Past Psychiatric History: I have reviewed past psychiatric history from progress note on 06/04/2021.  Past trials of sertraline-headaches, lorazepam-side effects, Klonopin-side effect, melatonin-did not work.  Cymbalta-side effect.  Ambien-excessive sleepiness.  Past Medical History:  Past Medical History:  Diagnosis Date   Anxiety    Depression    Eardrum rupture, left    Headache    Psoriatic arthritis (HCC)     Past Surgical History:  Procedure Laterality Date   CARPAL TUNNEL RELEASE Right 09/29/2014   Procedure: CARPAL TUNNEL RELEASE;  Surgeon: Myra Rude, MD;  Location: [facility] ORS;  Service: Orthopedics;  Laterality: Right;   CESAREAN SECTION     IUD placement     MYRINGOTOMY WITH TUBE PLACEMENT      Family Psychiatric History: I have reviewed family psychiatric history from progress note on 06/04/2021.  Family History:  Family History  Problem Relation Age of Onset   Healthy Mother    Bone cancer Maternal Grandfather    Mental illness Neg Hx  Social History: I have reviewed social history from progress note on 06/04/2021. Social History   Socioeconomic History   Marital status: Married    Spouse name: katherine   Number of children: 3   Years of education: Not on file   Highest education level: High school graduate  Occupational History   Not on file  Tobacco Use   Smoking status: Some Days    Current packs/day: 0.00    Types: Cigarettes    Last attempt to quit: 06/14/2018    Years since quitting: 4.6    Passive  exposure: Current   Smokeless tobacco: Never   Tobacco comments:    5-6 cigarettes daily  Vaping Use   Vaping status: Never Used  Substance and Sexual Activity   Alcohol use: Not Currently   Drug use: No   Sexual activity: Yes    Birth control/protection: I.U.D.  Other Topics Concern   Not on file  Social History Narrative   Not on file   Social Determinants of [name]   Financial Resource Strain: Not on file  Food Insecurity: Not on file  Transportation Needs: Not on file  Physical Activity: Inactive (03/26/2017)   Exercise Vital Sign    Days of Exercise per Week: 0 days    Minutes of Exercise per Session: 0 min  Stress: Stress Concern Present (03/26/2017)   Savannah [name] - Occupational Stress Questionnaire    Feeling of Stress : To some extent  Social Connections: Somewhat Isolated (03/26/2017)   Social Connection and Isolation Panel [NHANES]    Frequency of Communication with Friends and Family: Three times a week    Frequency of Social Gatherings with Friends and Family: Once a week    Attends Religious Services: Never    Database administrator or Organizations: No    Attends Banker Meetings: Never    Marital Status: Living with partner    Allergies:  Allergies  Allergen Reactions   Ibuprofen Other (See Comments)    Burns stomach    Metabolic Disorder Labs: No results found for: "HGBA1C", "MPG" No results found for: "PROLACTIN" Lab Results  Component Value Date   CHOL 200 (H) 02/21/2022   TRIG 263 (H) 02/21/2022   HDL 29 (L) 02/21/2022   LDLCALC 124 (H) 02/21/2022   LDLCALC 145 (H) 01/25/2021   Lab Results  Component Value Date   TSH 2.670 02/21/2022   TSH 2.120 01/25/2021    Therapeutic Level Labs: No results found for: "LITHIUM" No results found for: "VALPROATE" No results found for: "CBMZ"  Current Medications: Current Outpatient Medications  Medication Sig Dispense Refill   guselkumab (TREMFYA) 100  MG/ML pen Inject 100 mLs into the skin once a week.     levonorgestrel (MIRENA) 20 MCG/24HR IUD 1 each once by Intrauterine route.     modafinil (PROVIGIL) 200 MG tablet Take 1 tablet (200 mg total) by mouth daily. 30 tablet 2   omeprazole (PRILOSEC) 40 MG capsule TAKE 1 CAPSULE (40 MG TOTAL) BY MOUTH DAILY. 30 capsule 1   Roflumilast (ZORYVE) 0.3 % CREA Apply to aa's psoriasis QHS. 60 g 5   rosuvastatin (CRESTOR) 5 MG tablet TAKE 1 TABLET (5 MG TOTAL) BY MOUTH DAILY. 30 tablet 3   triamcinolone (KENALOG) 0.025 % cream Apply 1 Application topically 2 (two) times daily.     venlafaxine XR (EFFEXOR-XR) 75 MG 24 hr capsule TAKE 1 CAPSULE BY MOUTH DAILY WITH BREAKFAST. 90 capsule 0   Vitamin D,  Ergocalciferol, (DRISDOL) 1.25 MG (50000 UNIT) CAPS capsule TAKE 1 CAPSULE BY MOUTH ONE TIME PER WEEK (Patient taking differently: Take 50,000 Units by mouth every 7 (seven) days.) 4 capsule 3   No current facility-administered medications for this visit.     Musculoskeletal: Strength & Muscle Tone:  UTA Gait & Station: normal Patient leans: N/A  Psychiatric Specialty Exam: Review of Systems  Musculoskeletal:  Positive for myalgias.  Psychiatric/Behavioral:  Positive for sleep disturbance.     There were no vitals taken for this visit.There is no height or weight on file to calculate BMI.  General Appearance: Fairly Groomed  Eye Contact:  Fair  Speech:  Normal Rate  Volume:  Normal  Mood:  Euthymic  Affect:  Congruent  Thought Process:  Goal Directed and Descriptions of Associations: Intact  Orientation:  Full (Time, Place, and Person)  Thought Content: Logical   Suicidal Thoughts:  No  Homicidal Thoughts:  No  Memory:  Immediate;   Fair Recent;   Fair Remote;   Fair  Judgement:  Fair  Insight:  Fair  Psychomotor Activity:  Normal  Concentration:  Concentration: Fair and Attention Span: Fair  Recall:  [employer] of Knowledge: Fair  Language: Fair  Akathisia:  No  Handed:  Right   AIMS (if indicated): not done  Assets:  Communication Skills Desire for Improvement Housing Intimacy Social Support  ADL's:  Intact  Cognition: WNL  Sleep:   restless due to myalgia   Screenings: AIMS    Flowsheet Row Office Visit from 12/23/2021 in [name] Office Visit from 10/18/2021 in [facility] Office Visit from 09/05/2021 in [name]  AIMS Total Score 0 0 0      GAD-7    Flowsheet Row Video Visit from 02/20/2023 in [facility] Office Visit from 11/20/2022 in [facility] Office Visit from 09/24/2022 in [facility] Office Visit from 06/23/2022 in [name] Office Visit from 03/24/2022 in [facility]  Total GAD-7 Score 5 6 11 5 3       PHQ2-9    Flowsheet Row Video Visit from 02/20/2023 in [facility] Office Visit from 02/13/2023 in [facility] Office Visit from 11/20/2022 in [facility] Office Visit from 09/24/2022 in [facility] Office Visit from 06/23/2022 in [facility]  PHQ-2 Total Score 1 0 3 3 2   PHQ-9 Total Score -- -- 11 12 8       Flowsheet Row Video Visit from 02/20/2023 in [facility] Office Visit from 11/20/2022 in [facility] Office Visit from 09/24/2022 in [facility]  C-SSRS RISK CATEGORY No Risk No Risk No Risk        Assessment and Plan: Brystol Wasilewski is a 37 year old Caucasian female, married, lives in [place], has a history of GAD,  social anxiety disorder, insomnia, hyperlipidemia, vitamin D deficiency and multiple other medical problems currently with sleep issues mostly because of pain, with side effects to Ambien which was recently tried, discussed plan as noted below.  Plan GAD-stable Venlafaxine extended release 75 mg p.o. daily with breakfast Patient encouraged to establish care with a therapist  Social anxiety disorder-improving Patient to continue CBT as  needed  Insomnia-unstable due to pain Discontinue zolpidem for side effects Patient will need sufficient pain management Discussed trial of another sleep aid, patient is not interested at this time. Continue modafinil as prescribed Multiple sleep studies in the past-CPAP was not recommended.  Tobacco use disorder-improving Will monitor closely.  Follow-up in clinic in 3 months or sooner if needed.   Consent: Patient/Guardian gives verbal consent for treatment and assignment of benefits for services provided during this visit. Patient/Guardian expressed understanding and agreed to proceed.   This note was generated in part or whole with voice recognition software. Voice recognition is usually quite accurate but there are transcription errors that can and very often do occur. I apologize for any typographical errors that were not detected and corrected.    Jomarie Longs, MD 02/20/2023, 12:20 PM

## 2023-02-28 ENCOUNTER — Encounter: Payer: Self-pay | Admitting: Physician Assistant

## 2023-02-28 MED ORDER — AMOXICILLIN 875 MG PO TABS: 875.0000 mg | ORAL_TABLET | Freq: Two times a day (BID) | ORAL | 0 refills | Status: AC

## 2023-03-26 ENCOUNTER — Other Ambulatory Visit: Payer: Self-pay | Admitting: Physician Assistant

## 2023-03-30 ENCOUNTER — Telehealth: Payer: Self-pay

## 2023-03-30 DIAGNOSIS — F411 Generalized anxiety disorder: Secondary | ICD-10-CM

## 2023-03-30 DIAGNOSIS — F4010 Social phobia, unspecified: Secondary | ICD-10-CM

## 2023-03-30 MED ORDER — VENLAFAXINE HCL ER 75 MG PO CP24
75.0000 mg | ORAL_CAPSULE | Freq: Every day | ORAL | 0 refills | Status: DC
Start: 2023-03-30 — End: 2023-06-26

## 2023-03-30 NOTE — Telephone Encounter (Signed)
I have sent venlafaxine to pharmacy. 

## 2023-03-30 NOTE — Telephone Encounter (Signed)
left message notifying patient rx sent

## 2023-03-30 NOTE — Telephone Encounter (Signed)
received a fax requesting a refill on the vernlafaxine HCL ER 75mg  pt was last seen on 02-20-23 next 05-14-23

## 2023-04-19 ENCOUNTER — Other Ambulatory Visit: Payer: Self-pay | Admitting: Physician Assistant

## 2023-04-26 ENCOUNTER — Other Ambulatory Visit: Payer: Self-pay | Admitting: Nurse Practitioner

## 2023-04-26 DIAGNOSIS — K219 Gastro-esophageal reflux disease without esophagitis: Secondary | ICD-10-CM

## 2023-04-29 ENCOUNTER — Other Ambulatory Visit: Payer: Self-pay | Admitting: Nurse Practitioner

## 2023-04-29 DIAGNOSIS — G4726 Circadian rhythm sleep disorder, shift work type: Secondary | ICD-10-CM

## 2023-04-29 NOTE — Telephone Encounter (Signed)
Next6/25

## 2023-05-08 ENCOUNTER — Other Ambulatory Visit: Payer: Self-pay | Admitting: Physician Assistant

## 2023-05-10 LAB — SPECIMEN STATUS REPORT

## 2023-05-11 LAB — COMPREHENSIVE METABOLIC PANEL
ALT: 17 [IU]/L (ref 0–32)
AST: 14 [IU]/L (ref 0–40)
Albumin: 4.8 g/dL (ref 3.9–4.9)
Alkaline Phosphatase: 77 [IU]/L (ref 44–121)
BUN/Creatinine Ratio: 22 (ref 9–23)
BUN: 18 mg/dL (ref 6–20)
Bilirubin Total: 0.4 mg/dL (ref 0.0–1.2)
CO2: 25 mmol/L (ref 20–29)
Calcium: 9.6 mg/dL (ref 8.7–10.2)
Chloride: 100 mmol/L (ref 96–106)
Creatinine, Ser: 0.81 mg/dL (ref 0.57–1.00)
Globulin, Total: 2.5 g/dL (ref 1.5–4.5)
Glucose: 81 mg/dL (ref 70–99)
Potassium: 4.4 mmol/L (ref 3.5–5.2)
Sodium: 142 mmol/L (ref 134–144)
Total Protein: 7.3 g/dL (ref 6.0–8.5)
eGFR: 96 mL/min/{1.73_m2} (ref >59)

## 2023-05-11 LAB — UA/M W/RFLX CULTURE, ROUTINE
Bilirubin, UA: NEGATIVE
Glucose, UA: NEGATIVE
Ketones, UA: NEGATIVE
Leukocytes,UA: NEGATIVE
Nitrite, UA: NEGATIVE
Protein,UA: NEGATIVE
RBC, UA: NEGATIVE
Specific Gravity, UA: 1.029 (ref 1.005–1.030)
Urobilinogen, Ur: 0.2 mg/dL (ref 0.2–1.0)
pH, UA: 5.5 (ref 5.0–7.5)

## 2023-05-11 LAB — CBC WITH DIFFERENTIAL/PLATELET
Basophils Absolute: 0.1 10*3/uL (ref 0.0–0.2)
Basos: 1 %
EOS (ABSOLUTE): 0.2 10*3/uL (ref 0.0–0.4)
Eos: 2 %
Hematocrit: 45.8 % (ref 34.0–46.6)
Hemoglobin: 15.4 g/dL (ref 11.1–15.9)
Immature Grans (Abs): 0 10*3/uL (ref 0.0–0.1)
Immature Granulocytes: 0 %
Lymphocytes Absolute: 3.4 10*3/uL — ABNORMAL HIGH (ref 0.7–3.1)
Lymphs: 38 %
MCH: 31.4 pg (ref 26.6–33.0)
MCHC: 33.6 g/dL (ref 31.5–35.7)
MCV: 93 fL (ref 79–97)
Monocytes Absolute: 0.5 10*3/uL (ref 0.1–0.9)
Monocytes: 6 %
Neutrophils Absolute: 4.7 10*3/uL (ref 1.4–7.0)
Neutrophils: 53 %
Platelets: 299 10*3/uL (ref 150–450)
RBC: 4.91 x10E6/uL (ref 3.77–5.28)
RDW: 12.5 % (ref 11.7–15.4)
WBC: 9 10*3/uL (ref 3.4–10.8)

## 2023-05-11 LAB — LIPID PANEL WITH LDL/HDL RATIO
Cholesterol, Total: 187 mg/dL (ref 100–199)
HDL: 33 mg/dL — ABNORMAL LOW (ref >39)
LDL Chol Calc (NIH): 113 mg/dL — ABNORMAL HIGH (ref 0–99)
LDL/HDL Ratio: 3.4 {ratio} — ABNORMAL HIGH (ref 0.0–3.2)
Triglycerides: 237 mg/dL — ABNORMAL HIGH (ref 0–149)
VLDL Cholesterol Cal: 41 mg/dL — ABNORMAL HIGH (ref 5–40)

## 2023-05-11 LAB — VITAMIN D 25 HYDROXY (VIT D DEFICIENCY, FRACTURES): Vit D, 25-Hydroxy: 101 ng/mL — ABNORMAL HIGH (ref 30.0–100.0)

## 2023-05-11 LAB — B12 AND FOLATE PANEL
Folate: 11.9 ng/mL (ref >3.0)
Vitamin B-12: 497 pg/mL (ref 232–1245)

## 2023-05-11 LAB — MICROSCOPIC EXAMINATION
Casts: NONE SEEN /[LPF]
Epithelial Cells (non renal): >10 /[HPF] — AB (ref 0–10)

## 2023-05-11 LAB — URINE CULTURE, REFLEX

## 2023-05-11 LAB — IRON AND TIBC
Iron Saturation: 43 % (ref 15–55)
Iron: 172 ug/dL — ABNORMAL HIGH (ref 27–159)
Total Iron Binding Capacity: 396 ug/dL (ref 250–450)
UIBC: 224 ug/dL (ref 131–425)

## 2023-05-11 LAB — T4, FREE: Free T4: 0.74 ng/dL — ABNORMAL LOW (ref 0.82–1.77)

## 2023-05-11 LAB — TSH: TSH: 3.34 u[IU]/mL (ref 0.450–4.500)

## 2023-05-14 ENCOUNTER — Ambulatory Visit (INDEPENDENT_AMBULATORY_CARE_PROVIDER_SITE_OTHER): Payer: Commercial Managed Care - PPO | Admitting: Psychiatry

## 2023-05-14 ENCOUNTER — Encounter: Payer: Self-pay | Admitting: Psychiatry

## 2023-05-14 VITALS — BP 118/82 | HR 103 | Temp 98.7°F | Ht 63.0 in | Wt 173.2 lb

## 2023-05-14 DIAGNOSIS — F17200 Nicotine dependence, unspecified, uncomplicated: Secondary | ICD-10-CM

## 2023-05-14 DIAGNOSIS — G4701 Insomnia due to medical condition: Secondary | ICD-10-CM | POA: Diagnosis not present

## 2023-05-14 DIAGNOSIS — F411 Generalized anxiety disorder: Secondary | ICD-10-CM

## 2023-05-14 DIAGNOSIS — F4010 Social phobia, unspecified: Secondary | ICD-10-CM

## 2023-05-14 MED ORDER — VENLAFAXINE HCL ER 37.5 MG PO CP24: 37.5000 mg | ORAL_CAPSULE | Freq: Every day | ORAL | 0 refills | Status: DC

## 2023-05-14 NOTE — Progress Notes (Signed)
 BH MD OP Progress Note  05/14/2023 10:18 AM Savannah [name]  MRN:  [id number]  Chief Complaint:  Chief Complaint  Patient presents with   Follow-up   Depression   Anxiety   Medication Refill   HPI: Savannah [name] is a 38 year old Caucasian female who has a history of GAD, tobacco use disorder, social anxiety disorder, insomnia, vitamin D  deficiency, hyperlipidemia, recent tympanoplasty, psoriatic arthritis was evaluated in office today.  The patient reports an increase in random mood swings over the past month. She describes periods of feeling mellow and content, abruptly followed by feelings of carrying the weight of the world, with no identifiable triggers. These mood swings have been more frequent and random, occurring both at work and at home. The patient also reports a sense of heaviness in the room and a desire to withdraw from social interactions.  Sleep patterns remain inconsistent, with periods of adequate sleep (at least six hours) for several days, followed by a day of minimal sleep (around one hour). The patient denies any suicidal ideation but expresses a desire for a pause from the world.  The patient also reports a recent increase in smoking, particularly during a recent ten-day break from work. However, she has since returned to work and is attempting to cut back on smoking.  The patient has been on venlafaxine  75mg  for her anxiety, which she tolerates well. However, she feels that an increase in dosage may be beneficial due to the recent increase in mood swings. She also takes modafinil  on workdays and occasionally on non-workdays to prevent dizziness associated with abrupt discontinuation.  The patient recently had blood work done, which showed a low T4 thyroid  level. She is awaiting further consultation with her primary care doctor regarding this result. She also has a history of high cholesterol and triglycerides, which have shown some improvement over the past year.    The patient's work schedule involves night shifts, which may contribute to her irregular sleep patterns. She describes work as a significant stressor, along with everyday problems such as bills. The intensity of these stressors seems to fluctuate, with some days feeling more overwhelming than others.  The patient has not been in therapy but acknowledges that her coping mechanisms may not be effective. She expresses a willingness to try therapy.   Visit Diagnosis:    ICD-10-CM   1. GAD (generalized anxiety disorder)  F41.1 venlafaxine  XR (EFFEXOR -XR) 37.5 MG 24 hr capsule    2. Social anxiety disorder  F40.10 venlafaxine  XR (EFFEXOR -XR) 37.5 MG 24 hr capsule    3. Insomnia due to medical condition  G47.01    Lack of hygiene, shift at work    4. Tobacco use disorder  F17.200       Past Psychiatric History: I have reviewed past psychiatric history from progress note on 06/04/2021.  Past trials of sertraline -headaches, lorazepam-side effects, Klonopin-side effect, melatonin-did not work, Cymbalta -side effect, Ambien -excessive sleepiness.  Past Medical History:  Past Medical History:  Diagnosis Date   Anxiety    Depression    Eardrum rupture, left    Headache    Psoriatic arthritis (HCC)     Past Surgical History:  Procedure Laterality Date   CARPAL TUNNEL RELEASE Right 09/29/2014   Procedure: CARPAL TUNNEL RELEASE;  Surgeon: Lonni Sharps, MD;  Location: [facility] ORS;  Service: Orthopedics;  Laterality: Right;   CESAREAN SECTION     IUD placement     MYRINGOTOMY WITH TUBE PLACEMENT      Family Psychiatric History: I  have reviewed family psychiatric history from progress note on 06/04/2021.  Family History:  Family History  Problem Relation Age of Onset   Healthy Mother    Bone cancer Maternal Grandfather    Mental illness Neg Hx     Social History: I have reviewed social history from progress note on 06/04/2021. Social History   Socioeconomic History   Marital status:  Married    Spouse name: Savannah [name]   Number of children: 3   Years of education: Not on file   Highest education level: High school graduate  Occupational History   Not on file  Tobacco Use   Smoking status: Some Days    Current packs/day: 0.00    Types: Cigarettes    Last attempt to quit: 06/14/2018    Years since quitting: 4.9    Passive exposure: Current   Smokeless tobacco: Never   Tobacco comments:    5-6 cigarettes daily  Vaping Use   Vaping status: Never Used  Substance and Sexual Activity   Alcohol use: Not Currently   Drug use: No   Sexual activity: Yes    Birth control/protection: I.U.D.  Other Topics Concern   Not on file  Social History Narrative   Not on file   Social Drivers of Health   Financial Resource Strain: Not on file  Food Insecurity: Not on file  Transportation Needs: Not on file  Physical Activity: Inactive (03/26/2017)   Exercise Vital Sign    Days of Exercise per Week: 0 days    Minutes of Exercise per Session: 0 min  Stress: Stress Concern Present (03/26/2017)   [employer] of Occupational Health - Occupational Stress Questionnaire    Feeling of Stress : To some extent  Social Connections: Somewhat Isolated (03/26/2017)   Social Connection and Isolation Panel [NHANES]    Frequency of Communication with Friends and Family: Three times a week    Frequency of Social Gatherings with Friends and Family: Once a week    Attends Religious Services: Never    Database Administrator or Organizations: No    Attends Banker Meetings: Never    Marital Status: Living with partner    Allergies:  Allergies  Allergen Reactions   Ibuprofen Other (See Comments)    Burns stomach    Metabolic Disorder Labs: No results found for: HGBA1C, MPG No results found for: PROLACTIN Lab Results  Component Value Date   CHOL 187 05/08/2023   TRIG 237 (H) 05/08/2023   HDL 33 (L) 05/08/2023   LDLCALC 113 (H) 05/08/2023   LDLCALC 124 (H)  02/21/2022   Lab Results  Component Value Date   TSH 3.340 05/08/2023   TSH 2.670 02/21/2022    Therapeutic Level Labs: No results found for: LITHIUM No results found for: VALPROATE No results found for: CBMZ  Current Medications: Current Outpatient Medications  Medication Sig Dispense Refill   guselkumab (TREMFYA) 100 MG/ML pen Inject 100 mLs into the skin once a week.     levonorgestrel (MIRENA) 20 MCG/24HR IUD 1 each once by Intrauterine route.     modafinil  (PROVIGIL ) 200 MG tablet TAKE 1 TABLET BY MOUTH EVERY DAY 30 tablet 2   omeprazole  (PRILOSEC) 40 MG capsule TAKE 1 CAPSULE (40 MG TOTAL) BY MOUTH DAILY. 30 capsule 1   Roflumilast  (ZORYVE ) 0.3 % CREA Apply to aa's psoriasis QHS. 60 g 5   rosuvastatin  (CRESTOR ) 5 MG tablet TAKE 1 TABLET (5 MG TOTAL) BY MOUTH DAILY. 30 tablet 3  triamcinolone  (KENALOG ) 0.025 % cream Apply 1 Application topically 2 (two) times daily.     venlafaxine  XR (EFFEXOR -XR) 37.5 MG 24 hr capsule Take 1 capsule (37.5 mg total) by mouth daily with breakfast. Take along with 75 mg daily 90 capsule 0   venlafaxine  XR (EFFEXOR -XR) 75 MG 24 hr capsule Take 1 capsule (75 mg total) by mouth daily with breakfast. 90 capsule 0   Vitamin D , Ergocalciferol , (DRISDOL ) 1.25 MG (50000 UNIT) CAPS capsule TAKE 1 CAPSULE BY MOUTH ONE TIME PER WEEK 4 capsule 3   No current facility-administered medications for this visit.     Musculoskeletal: Strength & Muscle Tone: within normal limits Gait & Station: normal Patient leans: N/A  Psychiatric Specialty Exam: Review of Systems  Psychiatric/Behavioral:  Positive for sleep disturbance. The patient is nervous/anxious.     Blood pressure 118/82, pulse (!) 103, temperature 98.7 F (37.1 C), temperature source Temporal, height 5' 3 (1.6 m), weight 173 lb 3.2 oz (78.6 kg), SpO2 99%.Body mass index is 30.68 kg/m.  General Appearance: Fairly Groomed  Eye Contact:  Fair  Speech:  Clear and Coherent  Volume:  Normal   Mood:  Anxious  Affect:  Appropriate  Thought Process:  Goal Directed and Descriptions of Associations: Intact  Orientation:  Full (Time, Place, and Person)  Thought Content: Logical   Suicidal Thoughts:  No  Homicidal Thoughts:  No  Memory:  Immediate;   Fair Recent;   Fair Remote;   Fair  Judgement:  Fair  Insight:  Fair  Psychomotor Activity:  Normal  Concentration:  Concentration: Fair and Attention Span: Fair  Recall:  [employer] of Knowledge: Fair  Language: Fair  Akathisia:  No  Handed:  Right  AIMS (if indicated): not done  Assets:  Desire for Improvement Housing Social Support  ADL's:  Intact  Cognition: WNL  Sleep:  Poor   Screenings: Geneticist, Molecular Office Visit from 12/23/2021 in [facility] Office Visit from 10/18/2021 in [facility] Office Visit from 09/05/2021 in [facility]  AIMS Total Score 0 0 0      GAD-7    Flowsheet Row Office Visit from 05/14/2023 in [facility] Video Visit from 02/20/2023 in [facility] Office Visit from 11/20/2022 in [facility] Office Visit from 09/24/2022 in [facility] Office Visit from 06/23/2022 in [facility]  Total GAD-7 Score 8 5 6 11 5       PHQ2-9    Flowsheet Row Office Visit from 05/14/2023 in [facility] Video Visit from 02/20/2023 in [facility] Office Visit from 02/13/2023 in [facility] Office Visit from 11/20/2022 in [facility] Office Visit from 09/24/2022 in [facility]  PHQ-2 Total Score 4 1 0 3 3  PHQ-9 Total Score  10 -- -- 11 12      Flowsheet Row Office Visit from 05/14/2023 in [facility] Video Visit from 02/20/2023 in [facility] Office Visit from 11/20/2022 in [facility]  C-SSRS RISK CATEGORY No Risk No Risk No Risk        Assessment and Plan: Treasure Ochs is a 38 year old Caucasian female, married, lives in [place],  has a history of GAD, social anxiety disorder, insomnia, hyperlipidemia, vitamin D  deficiency and multiple other medical problems currently presents with worsening mood symptoms/anxiety discussed assessment and plan as noted below.   Generalized Anxiety Disorder-unstable Ongoing generalized anxiety with mood swings and feelings of heaviness, more frequent and severe since December. No suicidal ideation. Significant stressors include work and daily problems. No prior therapy or increased venlafaxine  dosage. - Increase venlafaxine  to 112.5 mg daily (75 mg + 37.5 mg) - Refer to in-house therapist for counseling - Follow up in 8 weeks  Social anxiety disorder-improving No issues reported today.  Referred to start therapy. - Patient to establish care with a therapist to start CBT  Sleep Disorder/insomnia-unstable Irregular sleep patterns with periods of good sleep followed by poor sleep. Ambien  was ineffective and caused prolonged sedation. Work schedule contributes to disturbances. Discussed melatonin as an alternative. - Recommend over-the-counter melatonin for sleep - On modafinil  prescribed by primary care provider uses it as needed. - Multiple sleep studies completed in the past on CPAP was not recommended.  Nicotine Dependence/tobacco use disorder-unstable Fluctuating smoking habits with increased smoking during holidays and recent attempts to cut back. Nicotine withdrawal may contribute to mood swings and irritability. Discussed benefits of smoking  cessation, including improved mood stability, cardiovascular health, and reduced respiratory risk. - Continue efforts to reduce smoking - Discuss benefits of smoking cessation, provided counseling for 1 minute.  Subclinical Hypothyroidism Low T4 with normal TSH on recent blood work. Concern about thyroid  function affecting mood and other symptoms. Advised follow-up with primary care physician for further evaluation and potential repeat testing. Explained that treatment may not be necessary if TSH remains normal, but monitoring is important. - Advise to contact primary care physician regarding thyroid  function test results  Follow-up - Schedule follow-up appointment in 8 weeks - Schedule appointment with in-house therapist.  Collaboration of Care: Collaboration of Care: Primary Care Provider AEB patient to follow up with primary care provider for thyroid  abnormalities and Referral or follow-up with counselor/therapist AEB follow-up with in-house therapist, communicated with staff to start CBT  Patient/Guardian was advised Release of Information must be obtained prior to any record release in order to collaborate their care with an outside provider. Patient/Guardian was advised if they have not already done so to contact the registration department to sign all necessary forms in order for us  to release information regarding their care.   Consent: Patient/Guardian gives verbal consent for treatment and assignment of benefits for services provided during this visit. Patient/Guardian expressed understanding and agreed to proceed.   This note was generated in part or whole with voice recognition software. Voice recognition is usually quite accurate but there are transcription errors that can and very often do occur. I apologize for any typographical errors that were not detected and corrected.    Infant Zink, MD 05/14/2023, 10:18 AM

## 2023-05-19 ENCOUNTER — Telehealth: Payer: Self-pay

## 2023-05-19 ENCOUNTER — Other Ambulatory Visit: Payer: Self-pay | Admitting: Physician Assistant

## 2023-05-19 DIAGNOSIS — R7989 Other specified abnormal findings of blood chemistry: Secondary | ICD-10-CM

## 2023-05-19 NOTE — Telephone Encounter (Signed)
-----   Message from Jacques Mattock sent at 05/19/2023  1:03 PM EST ----- Please let her know that her Vit D is now high and should decrease supplement--stop drisdol  and can switch to lower dose OTC, cholesterol elevated but improving and should continue to work on this, iron a little elevated and can decrease any supplement, thyroid  levels a little low and will plan to recheck in a few months--labs ordered

## 2023-05-19 NOTE — Telephone Encounter (Signed)
LVM for patient regarding labs 

## 2023-05-21 ENCOUNTER — Encounter: Payer: Self-pay | Admitting: Physician Assistant

## 2023-05-21 ENCOUNTER — Other Ambulatory Visit: Payer: Self-pay

## 2023-05-21 MED ORDER — AMOXICILLIN 875 MG PO TABS: 875.0000 mg | ORAL_TABLET | Freq: Two times a day (BID) | ORAL | 0 refills | Status: DC

## 2023-05-27 ENCOUNTER — Telehealth: Payer: Self-pay

## 2023-05-27 NOTE — Telephone Encounter (Signed)
 Have tried to reach patient regarding lab results, no answer.

## 2023-05-27 NOTE — Telephone Encounter (Signed)
-----   Message from Jacques Mattock sent at 05/19/2023  1:03 PM EST ----- Please let her know that her Vit D is now high and should decrease supplement--stop drisdol  and can switch to lower dose OTC, cholesterol elevated but improving and should continue to work on this, iron a little elevated and can decrease any supplement, thyroid  levels a little low and will plan to recheck in a few months--labs ordered

## 2023-06-05 ENCOUNTER — Ambulatory Visit (INDEPENDENT_AMBULATORY_CARE_PROVIDER_SITE_OTHER): Payer: Commercial Managed Care - PPO | Admitting: Professional Counselor

## 2023-06-05 DIAGNOSIS — F411 Generalized anxiety disorder: Secondary | ICD-10-CM
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
## 2023-06-16 ENCOUNTER — Telehealth: Payer: Self-pay | Admitting: Professional Counselor

## 2023-06-18 ENCOUNTER — Ambulatory Visit (INDEPENDENT_AMBULATORY_CARE_PROVIDER_SITE_OTHER): Payer: Commercial Managed Care - PPO | Admitting: Professional Counselor

## 2023-06-18 ENCOUNTER — Ambulatory Visit: Payer: Commercial Managed Care - PPO | Admitting: Dermatology

## 2023-06-18 DIAGNOSIS — F331 Major depressive disorder, recurrent, moderate: Secondary | ICD-10-CM | POA: Diagnosis not present

## 2023-06-18 DIAGNOSIS — F411 Generalized anxiety disorder: Secondary | ICD-10-CM | POA: Diagnosis not present

## 2023-06-18 NOTE — Progress Notes (Signed)
   THERAPIST PROGRESS NOTE  Session Time: 9:02 AM - 9:58 AM   Participation Level: Active  Behavioral Response: CasualAlertEuthymic  Type of Therapy: Individual Therapy  Treatment Goals addressed: Active OP Depression  LTG: I would say to process and share emotions in a healthy manner because I know I do not.     Start:  06/18/23    Expected End:  06/16/24     STG: Continuous self-confidence. To improve concept of self AEB utilizing daily affirmations, restructuring core beliefs about self, and a self-report on improvements over the next 90 days.    STG: I guess to be better at expressing how I feel. To improve emotion identification and communication AEB psychoeducation on emotions and utilizing role-playing and communication skills to share feelings 3 out of 7 days a week.   ProgressTowards Goals: Initial  Interventions: Motivational Interviewing and Solution Focused  Summary: Savannah [name] is a 38 y.o. female who presents with a history of depression and anxiety. She appeared alert and oriented x5. She was casually dressed and groomed. She reported she has returned to work but has to engage in a program for two years. She provided a ROI to send records to her EAP for confirmation. Chantelle reported she's been journaling to help process her thoughts and identify things to talk about in session. She engaged in developing her treatment plan. She was able to identify a few coping skills and took copies of handouts so she can continue to practice those skills. [name] discussed parenting and struggles with her step-son. She agreed to additional session next month.   Therapist Response: Conducted session with Rosina. Began session with check-in/update since previous session. Utilized empathetic and reflective listening. Attempted to contact [facility] about EAP/required treatment by employer. Left message. Developed treatment plan with input from [place] on current strengths, needs, and  progress towards goals. Reviewed Crystalynn's knowledge of coping mechanisms and provided handouts on breathing exercises, 5-4-3-2-1, and TIP skill. Used open-ended questions to engage in discussion and explored parenting techniques. Scheduled additional follow-up appointment and concluded session.   Suicidal/Homicidal: No  Plan: Return again in 2 weeks.  Diagnosis: Major depressive disorder, recurrent episode, moderate (HCC)  GAD (generalized anxiety disorder)  Collaboration of Care: Medication Management AEB chart review  Patient/Guardian was advised Release of Information must be obtained prior to any record release in order to collaborate their care with an outside provider. Patient/Guardian was advised if they have not already done so to contact the registration department to sign all necessary forms in order for us  to release information regarding their care.   Consent: Patient/Guardian gives verbal consent for treatment and assignment of benefits for services provided during this visit. Patient/Guardian expressed understanding and agreed to proceed.   [name], [facility] 06/18/2023

## 2023-06-25 ENCOUNTER — Other Ambulatory Visit: Payer: Self-pay | Admitting: Psychiatry

## 2023-06-25 DIAGNOSIS — F4010 Social phobia, unspecified: Secondary | ICD-10-CM

## 2023-06-25 DIAGNOSIS — F411 Generalized anxiety disorder: Secondary | ICD-10-CM

## 2023-07-02 ENCOUNTER — Ambulatory Visit (INDEPENDENT_AMBULATORY_CARE_PROVIDER_SITE_OTHER): Payer: Commercial Managed Care - PPO | Admitting: Professional Counselor

## 2023-07-02 DIAGNOSIS — Z91199 Patient's noncompliance with other medical treatment and regimen due to unspecified reason: Secondary | ICD-10-CM

## 2023-07-02 NOTE — Progress Notes (Signed)
Patient no-showed today's appointment; appointment was for 07/02/23 at 10 AM for outpatient therapy. Patient was called and sent a text notifying of switch to virtual appointment. Patient did not check in at office or online for session.

## 2023-07-03 ENCOUNTER — Other Ambulatory Visit: Payer: Self-pay | Admitting: Nurse Practitioner

## 2023-07-03 DIAGNOSIS — K219 Gastro-esophageal reflux disease without esophagitis: Secondary | ICD-10-CM

## 2023-07-06 ENCOUNTER — Encounter: Payer: Self-pay | Admitting: Psychiatry

## 2023-07-06 ENCOUNTER — Ambulatory Visit (INDEPENDENT_AMBULATORY_CARE_PROVIDER_SITE_OTHER): Payer: Commercial Managed Care - PPO | Admitting: Psychiatry

## 2023-07-06 VITALS — BP 110/76 | HR 104 | Temp 98.7°F | Ht 63.0 in | Wt 172.0 lb

## 2023-07-06 DIAGNOSIS — G4701 Insomnia due to medical condition: Secondary | ICD-10-CM

## 2023-07-06 DIAGNOSIS — F17200 Nicotine dependence, unspecified, uncomplicated: Secondary | ICD-10-CM

## 2023-07-06 DIAGNOSIS — F4010 Social phobia, unspecified: Secondary | ICD-10-CM | POA: Diagnosis not present

## 2023-07-06 DIAGNOSIS — F411 Generalized anxiety disorder: Secondary | ICD-10-CM | POA: Diagnosis not present

## 2023-07-06 NOTE — Progress Notes (Unsigned)
 BH MD OP Progress Note  07/06/2023 11:18 AM [name]  MRN:  [id number]  Chief Complaint:  Chief Complaint  Patient presents with   Follow-up   Anxiety   Depression   Medication Refill   HPI: Savannah [name] is a 38 year old Caucasian female who has a history of GAD, tobacco use disorder, social anxiety disorder, insomnia, vitamin D deficiency, hyperlipidemia, recent tympanoplasty, psoriatic arthritis was evaluated in office today.  Patient reports since increasing the venlafaxine dosage her mood symptoms have improved.  Her mood swings and anxiety symptoms have become less frequent since dosage increase of venlafaxine early January.  She is currently tolerating the higher dosage of venlafaxine.  She feels she sleeps excessively, attributing this to her medication. She sleeps at least six hours a night, with more sleep on weekends. A history of sleep studies revealed an isolated incident of increased heart rate, but no other significant findings. She is currently taking modafinil but can still fall asleep after taking it. She attributes some sleep issues to her shift work schedule, which requires sleeping during the day and working at night.  She denies any suicidality, homicidality or perceptual disturbances.  Patient denies any other concerns today.  Visit Diagnosis:    ICD-10-CM   1. GAD (generalized anxiety disorder)  F41.1     2. Social anxiety disorder  F40.10     3. Insomnia due to medical condition  G47.01    Lack of hygiene, shift work sleep problems    4. Tobacco use disorder  F17.200       Past Psychiatric History: I have reviewed past psychiatric history from progress note on 06/04/2021.  Past trials of sertraline-headaches, lorazepam-side effects, Klonopin-side effect, melatonin-did not work, Cymbalta-side effect, Ambien-excessive sleepiness  Past Medical History:  Past Medical History:  Diagnosis Date   Anxiety    Depression    Eardrum rupture, left     Headache    Psoriatic arthritis (HCC)     Past Surgical History:  Procedure Laterality Date   CARPAL TUNNEL RELEASE Right 09/29/2014   Procedure: CARPAL TUNNEL RELEASE;  Surgeon: Myra Rude, MD;  Location: [facility] ORS;  Service: Orthopedics;  Laterality: Right;   CESAREAN SECTION     IUD placement     MYRINGOTOMY WITH TUBE PLACEMENT      Family Psychiatric History: I have reviewed past psychiatric history from progress note on 06/04/2021.  Family History:  Family History  Problem Relation Age of Onset   Healthy Mother    Bone cancer Maternal Grandfather    Mental illness Neg Hx     Social History: I have reviewed social history from progress note on 06/04/2021. Social History   Socioeconomic History   Marital status: Married    Spouse name: katherine   Number of children: 3   Years of education: Not on file   Highest education level: High school graduate  Occupational History   Not on file  Tobacco Use   Smoking status: Some Days    Current packs/day: 0.00    Types: Cigarettes    Last attempt to quit: 06/14/2018    Years since quitting: 5.0    Passive exposure: Current   Smokeless tobacco: Never   Tobacco comments:    5-6 cigarettes daily  Vaping Use   Vaping status: Never Used  Substance and Sexual Activity   Alcohol use: Not Currently   Drug use: No   Sexual activity: Yes    Birth control/protection: I.U.D.  Other Topics Concern  Not on file  Social History Narrative   Not on file   Social Drivers of Health   Financial Resource Strain: Medium Risk (06/05/2023)   Overall Financial Resource Strain (CARDIA)    Difficulty of Paying Living Expenses: Somewhat hard  Food Insecurity: No Food Insecurity (06/05/2023)   Hunger Vital Sign    Worried About Running Out of Food in the Last Year: Never true    Ran Out of Food in the Last Year: Never true  Transportation Needs: No Transportation Needs (06/05/2023)   PRAPARE - Administrator, Civil Service  (Medical): No    Lack of Transportation (Non-Medical): No  Physical Activity: Inactive (06/05/2023)   Exercise Vital Sign    Days of Exercise per Week: 0 days    Minutes of Exercise per Session: 0 min  Stress: Stress Concern Present (06/05/2023)   [employer] of Occupational Health - Occupational Stress Questionnaire    Feeling of Stress : Rather much  Social Connections: Socially Isolated (06/05/2023)   Social Connection and Isolation Panel [NHANES]    Frequency of Communication with Friends and Family: Never    Frequency of Social Gatherings with Friends and Family: Never    Attends Religious Services: Never    Database administrator or Organizations: No    Attends Banker Meetings: Never    Marital Status: Married    Allergies:  Allergies  Allergen Reactions   Ibuprofen Other (See Comments)    Burns stomach    Metabolic Disorder Labs: No results found for: "HGBA1C", "MPG" No results found for: "PROLACTIN" Lab Results  Component Value Date   CHOL 187 05/08/2023   TRIG 237 (H) 05/08/2023   HDL 33 (L) 05/08/2023   LDLCALC 113 (H) 05/08/2023   LDLCALC 124 (H) 02/21/2022   Lab Results  Component Value Date   TSH 3.340 05/08/2023   TSH 2.670 02/21/2022    Therapeutic Level Labs: No results found for: "LITHIUM" No results found for: "VALPROATE" No results found for: "CBMZ"  Current Medications: Current Outpatient Medications  Medication Sig Dispense Refill   folic acid (FOLVITE) 1 MG tablet Take by mouth.     guselkumab (TREMFYA) 100 MG/ML pen Inject 100 mLs into the skin once a week.     levonorgestrel (MIRENA) 20 MCG/24HR IUD 1 each once by Intrauterine route.     methotrexate (RHEUMATREX) 2.5 MG tablet SMARTSIG:5 Tablet(s) By Mouth Once a Week     modafinil (PROVIGIL) 200 MG tablet TAKE 1 TABLET BY MOUTH EVERY DAY 30 tablet 2   omeprazole (PRILOSEC) 40 MG capsule TAKE 1 CAPSULE (40 MG TOTAL) BY MOUTH DAILY. 30 capsule 1   Roflumilast (ZORYVE)  0.3 % CREA Apply to aa's psoriasis QHS. 60 g 5   rosuvastatin (CRESTOR) 5 MG tablet TAKE 1 TABLET (5 MG TOTAL) BY MOUTH DAILY. 30 tablet 3   triamcinolone (KENALOG) 0.025 % cream Apply 1 Application topically 2 (two) times daily.     venlafaxine XR (EFFEXOR-XR) 37.5 MG 24 hr capsule Take 1 capsule (37.5 mg total) by mouth daily with breakfast. Take along with 75 mg daily 90 capsule 0   venlafaxine XR (EFFEXOR-XR) 75 MG 24 hr capsule TAKE 1 CAPSULE BY MOUTH DAILY WITH BREAKFAST. 90 capsule 0   Vitamin D, Ergocalciferol, (DRISDOL) 1.25 MG (50000 UNIT) CAPS capsule Take 50,000 Units by mouth once a week.     No current facility-administered medications for this visit.     Musculoskeletal: Strength & Muscle Tone:  within normal limits Gait & Station: normal Patient leans: N/A  Psychiatric Specialty Exam: Review of Systems  Psychiatric/Behavioral:  Positive for sleep disturbance. The patient is nervous/anxious.     Blood pressure 110/76, pulse (!) 104, temperature 98.7 F (37.1 C), temperature source Temporal, height 5\' 3"  (1.6 m), weight 172 lb (78 kg), SpO2 100%.Body mass index is 30.47 kg/m.  General Appearance: Casual  Eye Contact:  Fair  Speech:  Clear and Coherent  Volume:  Normal  Mood:  Anxious  Affect:  Congruent  Thought Process:  Goal Directed and Descriptions of Associations: Intact  Orientation:  Full (Time, Place, and Person)  Thought Content: Logical   Suicidal Thoughts:  No  Homicidal Thoughts:  No  Memory:  Immediate;   Fair Recent;   Fair Remote;   Fair  Judgement:  Fair  Insight:  Fair  Psychomotor Activity:  Normal  Concentration:  Concentration: Fair and Attention Span: Fair  Recall:  [employer] of Knowledge: Fair  Language: Fair  Akathisia:  No  Handed:  Right  AIMS (if indicated): not done  Assets:  Desire for Improvement Social Support Transportation  ADL's:  Intact  Cognition: WNL  Sleep:   excessive    Screenings: Geneticist, molecular  Office Visit from 12/23/2021 in [facility] Office Visit from 10/18/2021 in [facility] Office Visit from 09/05/2021 in [facility]  AIMS Total Score 0 0 0      GAD-7    Flowsheet Row Counselor from 06/05/2023 in [facility] Office Visit from 05/14/2023 in [facility] Video Visit from 02/20/2023 in [facility] Office Visit from 11/20/2022 in [facility] Office Visit from 09/24/2022 in [facility]  Total GAD-7 Score 7 8 5 6 11       PHQ2-9    Flowsheet Row Office Visit from 07/06/2023 in [facility] Counselor from 06/05/2023 in [facility] Office Visit from 05/14/2023 in [facility] Video Visit from 02/20/2023 in [facility] Office Visit from 02/13/2023 in [facility]  PHQ-2 Total Score 2 3 4 1  0  PHQ-9 Total Score 8 12 10  -- --      Flowsheet Row Office Visit from 07/06/2023 in [facility] Counselor from 06/05/2023 in [facility] Office Visit from 05/14/2023 in [facility]  C-SSRS RISK CATEGORY No Risk No Risk No Risk        Assessment and Plan: Savannah [name] is a 38 year old Caucasian female, married, lives in [place], has a history of GAD, social anxiety disorder, insomnia, hyperlipidemia, vitamin D deficiency and multiple other medical problems currently presents with improvement in her mood symptoms, discussed assessment and plan as noted below.  Generalized anxiety  disorder-improving Social anxiety disorder-improving Current dosage of venlafaxine helpful with anxiety and mood swings. - Continue Venlafaxine 112.5 mg daily - Patient referred to in-house therapist for counseling-pending  Sleep disorder/insomnia-unstable Patient with irregular sleep pattern with.  Some good sleep followed by poor sleep.  Patient does struggle with shift at work since she works night shift 5 days a week and then has 2 days off when she tries to switch to sleeping at night during that  time which does not work and she needs to take naps throughout the day. - Continue Modafinil prescribed by primary care provider. - Discussed sleep hygiene techniques as well as possibly switching to a dayshift job. - Patient had multiple sleep studies completed in the past, CPAP was not recommended.  Nicotine dependence/tobacco use disorder-unstable Patient with fluctuating smoking habits with increased smoking on and off. - Continue to cut back on smoking.  Follow-up - Schedule follow-up appointment for late April - Consider video visit for next appointment.  Collaboration of Care: Collaboration of Care: Referral or follow-up with counselor/therapist AEB patient to schedule an appointment with therapist if not already done so.  Patient/Guardian was advised Release of Information must be obtained prior to any record release in order to collaborate their care with an outside provider. Patient/Guardian was advised if they have not already done so to contact the registration department to sign all necessary forms in order for [place] to release information regarding their care.   Consent: Patient/Guardian gives verbal consent for treatment and assignment of benefits for services provided during this visit. Patient/Guardian expressed understanding and agreed to proceed.   This note was generated in part or whole with voice recognition software. Voice recognition is usually quite accurate but there are  transcription errors that can and very often do occur. I apologize for any typographical errors that were not detected and corrected.    Jomarie Longs, MD 07/07/2023, 5:10 PM

## 2023-07-09 ENCOUNTER — Ambulatory Visit: Payer: Commercial Managed Care - PPO | Admitting: Dermatology

## 2023-07-16 ENCOUNTER — Ambulatory Visit: Payer: Commercial Managed Care - PPO | Admitting: Professional Counselor

## 2023-07-16 DIAGNOSIS — F411 Generalized anxiety disorder: Secondary | ICD-10-CM

## 2023-07-16 NOTE — Progress Notes (Signed)
 THERAPIST PROGRESS NOTE  Virtual Visit via Video Note  I connected with Randel Books on 07/16/23 at 10:00 AM EST by a video enabled telemedicine application and verified that I am speaking with the correct person using two identifiers.  Location: Patient: Home Provider: Office   I discussed the limitations of evaluation and management by telemedicine and the availability of in person appointments. The patient expressed understanding and agreed to proceed.  I discussed the assessment and treatment plan with the patient. The patient was provided an opportunity to ask questions and all were answered. The patient agreed with the plan and demonstrated an understanding of the instructions.   The patient was advised to call back or seek an in-person evaluation if the symptoms worsen or if the condition fails to improve as anticipated.  I provided 39 minutes of non-face-to-face time during this encounter. Edmonia Lynch, [facility]  Session Time: 10:04 AM - 10:43 AM   Participation Level: Active  Behavioral Response: Casual, Alert, Euthymic  Type of Therapy: Individual Therapy  Treatment Goals addressed: Active OP Depression  LTG: "I would say to process and share emotions in a healthy manner because I know I do not."     Start:  06/18/23    Expected End:  06/16/24     STG: "Continuous self-confidence." To improve concept of self AEB utilizing daily affirmations, restructuring core beliefs about self, and a self-report on improvements over the next 90 days.    STG: "I guess to be better at expressing how I feel." To improve emotion identification and communication AEB psychoeducation on emotions and utilizing role-playing and communication skills to share feelings 3 out of 7 days a week.   STG: Maintain abstinence from substance use AEB utilizing positive replacement behaviors to manage pain over the next 90 days    ProgressTowards Goals: Progressing  Interventions: CBT,  Motivational Interviewing, and Meditation: Chronic pain  Summary: Channah Godeaux is a 38 y.o. female who presents with a history of anxiety, depression, and trauma.  She appeared alert and oriented x 5.  She reported she slept through her alarm and that is why we switched to virtual today.  Reannon noted "not a lot" is going on.  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  She noted this was overwhelming but has been helpful to do.  Amy stated her work has switched to a rotation where she is doing different positions.  It has been an adjustment but she is managing well.  Lamoyne reported she has not used marijuana but is still struggling with pain.  She confirmed changed to treatment plan and provided verbal consent to sign consent form again. Geonna actively engaged in meditation.  She expressed it was hard to remain focused but it could be helpful. She appeared receptive to trying on her own.  Therapist Response: Conducted session with Morrie Sheldon. Began session with check-in/update since previous session. Utilized empathetic and reflective listening.  Revised to treatment plan to include goal around marijuana use.  Obtained verbal consent to sign consent form again.  Explained benefits of meditations for chronic pain.  Engaged Lianny in a guided meditation for pain.  Encouraged her to engage in these practices on her own between now and next session.  Scheduled additional appointment and concluded session.   Suicidal/Homicidal: No  Plan: Return again in 2 weeks.  Diagnosis: GAD (generalized anxiety disorder)  Collaboration of Care: Medication Management AEB char review  Patient/Guardian was advised Release of Information  must be obtained prior to any record release in order to collaborate their care with an outside provider. Patient/Guardian was advised if they have not already done so to contact the registration department to sign all necessary forms in order for [place] to release  information regarding their care.   Consent: Patient/Guardian gives verbal consent for treatment and assignment of benefits for services provided during this visit. Patient/Guardian expressed understanding and agreed to proceed.   Edmonia Lynch, [facility] 07/16/2023

## 2023-07-31 ENCOUNTER — Ambulatory Visit: Admitting: Professional Counselor

## 2023-07-31 DIAGNOSIS — F411 Generalized anxiety disorder: Secondary | ICD-10-CM | POA: Diagnosis not present

## 2023-07-31 NOTE — Progress Notes (Signed)
 THERAPIST PROGRESS NOTE  Virtual Visit via Video Note  I connected with Savannah [name] on 07/31/23 at  9:00 AM EDT by a video enabled telemedicine application and verified that I am speaking with the correct person using two identifiers.  Location: Patient: Home Provider: Home Office   I discussed the limitations of evaluation and management by telemedicine and the availability of in person appointments. The patient expressed understanding and agreed to proceed.   I discussed the assessment and treatment plan with the patient. The patient was provided an opportunity to ask questions and all were answered. The patient agreed with the plan and demonstrated an understanding of the instructions.   The patient was advised to call back or seek an in-person evaluation if the symptoms worsen or if the condition fails to improve as anticipated.  I provided 37 minutes of non-face-to-face time during this encounter. Edmonia Lynch, [facility]  Session Time: 9:09 AM - 9:46 AM   Participation Level: Active  Behavioral Response: Casual, Alert, Euthymic  Type of Therapy: Individual Therapy  Treatment Goals addressed: Active OP Depression  LTG: "I would say to process and share emotions in a healthy manner because I know I do not."                 Start:  06/18/23    Expected End:  06/16/24      STG: "Continuous self-confidence." To improve concept of self AEB utilizing daily affirmations, restructuring core beliefs about self, and a self-report on improvements over the next 90 days.     STG: "I guess to be better at expressing how I feel." To improve emotion identification and communication AEB psychoeducation on emotions and utilizing role-playing and communication skills to share feelings 3 out of 7 days a week.    STG: Maintain abstinence from substance use AEB utilizing positive replacement behaviors to manage pain over the next 90 days    ProgressTowards Goals:  Progressing  Interventions: CBT and Motivational Interviewing  Summary: Savannah [name] is a 38 y.o. female who presents with a history of anxiety and depression. She appeared alert and oriented x5. She stated she's, "good, just working." She reported they finished moving storage units and have been busy moving her mother-in-law. Savannah [name] scored minimal on anxiety and depression screenings today. She noted ongoing struggle with communication and confrontation. She reported she tends to shut down in those situations. Savannah [name] engaged in writing exercise and was able to identify things within and outside of her control. She was in agreement to discuss some communication skills next session.   Therapist Response: Conducted session with Savannah [name]. Began session with check-in/update since previous session. Utilized empathetic and reflective listening. Administered GAD7 and PHQ9 screening. Reviewed treatment plan to gain ideas for session discussion. Explored Savannah [name]'s thoughts/feelings about communication struggles. Engaged Veyda in writing exercise - donut/circles of control. Assisted with identifying things within and outside of her control. Reminded her to stay focused on those things within her control. Scheduled additional appointment and concluded session.  PHQ/GAD review tx plan, communication, donut exercise  Suicidal/Homicidal: No  Plan: Return again in 2 weeks.  Diagnosis: GAD (generalized anxiety disorder)  Collaboration of Care: Medication Management AEB chart review  Patient/Guardian was advised Release of Information must be obtained prior to any record release in order to collaborate their care with an outside provider. Patient/Guardian was advised if they have not already done so to contact the registration department to sign all necessary forms in order for [place] to release  information regarding their care.   Consent: Patient/Guardian gives verbal consent for treatment and assignment of benefits  for services provided during this visit. Patient/Guardian expressed understanding and agreed to proceed.   Edmonia Lynch, [facility] 07/31/2023

## 2023-08-01 ENCOUNTER — Other Ambulatory Visit: Payer: Self-pay | Admitting: Physician Assistant

## 2023-08-08 ENCOUNTER — Other Ambulatory Visit: Payer: Self-pay | Admitting: Nurse Practitioner

## 2023-08-08 DIAGNOSIS — G4726 Circadian rhythm sleep disorder, shift work type: Secondary | ICD-10-CM

## 2023-08-14 ENCOUNTER — Ambulatory Visit: Admitting: Professional Counselor

## 2023-08-14 DIAGNOSIS — F411 Generalized anxiety disorder: Secondary | ICD-10-CM | POA: Diagnosis not present

## 2023-08-14 NOTE — Progress Notes (Signed)
 THERAPIST PROGRESS NOTE  Virtual Visit via Video Note  I connected with Savannah [name] on 08/14/23 at  9:00 AM EDT by a video enabled telemedicine application and verified that I am speaking with the correct person using two identifiers.  Location: Patient: Home Provider: Home office   I discussed the limitations of evaluation and management by telemedicine and the availability of in person appointments. The patient expressed understanding and agreed to proceed.   I discussed the assessment and treatment plan with the patient. The patient was provided an opportunity to ask questions and all were answered. The patient agreed with the plan and demonstrated an understanding of the instructions.   The patient was advised to call back or seek an in-person evaluation if the symptoms worsen or if the condition fails to improve as anticipated.  I provided 43 minutes of non-face-to-face time during this encounter. Savannah [name], [facility]  Session Time: 9:00 AM - 9:43 AM  Participation Level: Active  Behavioral Response: Casual, Alert, Anxious  Type of Therapy: Individual Therapy  Treatment Goals addressed: Active OP Depression  LTG: "I would say to process and share emotions in a healthy manner because I know I do not."                 Start:  06/18/23    Expected End:  06/16/24      STG: "Continuous self-confidence." To improve concept of self AEB utilizing daily affirmations, restructuring core beliefs about self, and a self-report on improvements over the next 90 days.     STG: "I guess to be better at expressing how I feel." To improve emotion identification and communication AEB psychoeducation on emotions and utilizing role-playing and communication skills to share feelings 3 out of 7 days a week.    STG: Maintain abstinence from substance use AEB utilizing positive replacement behaviors to manage pain over the next 90 days    ProgressTowards Goals:  Progressing  Interventions: CBT and Supportive  Summary: Savannah [name] is a 38 y.o. female who presents with  a history of anxiety and depression. She appeared alert and oriented x5. She stated things are going well. Savannah [name] took notes on Savannah [name]. She noted it will be helpful but wasn't sure on a situation to practice with during session. She noted an upcoming medical procedure and expressed her concerns about it. She was receptive to focusing on what's in her control. She also agreed on grounding skills, checking the facts, and communicating with the provider.    Therapist Response: Conducted session with Savannah [name]. Began session with check-in/update since previous session. Utilized empathetic and reflective listening. Explained DEAR MAN skill and modeled ways to use skill. Processed Savannah [name]'s thoughts/feelings about upcoming medical procedure. Reminded her of donut exercise - what's in her control. Noted how to check the facts for emotion regulation and used DEAR MAN to communicate concerns/needs with provider. Scheduled additional appointment and concluded session.   Suicidal/Homicidal: No  Plan: Return again in 4 weeks.  Diagnosis: GAD (generalized anxiety disorder)  Collaboration of Care: Medication Management AEB chart review  Patient/Guardian was advised Release of Information must be obtained prior to any record release in order to collaborate their care with an outside provider. Patient/Guardian was advised if they have not already done so to contact the registration department to sign all necessary forms in order for [place] to release information regarding their care.   Consent: Patient/Guardian gives verbal consent for treatment and assignment of benefits for services provided during  this visit. Patient/Guardian expressed understanding and agreed to proceed.   Savannah [name], [facility] 08/14/2023

## 2023-08-16 ENCOUNTER — Other Ambulatory Visit: Payer: Self-pay | Admitting: Psychiatry

## 2023-08-16 DIAGNOSIS — F411 Generalized anxiety disorder: Secondary | ICD-10-CM

## 2023-08-16 DIAGNOSIS — F4010 Social phobia, unspecified: Secondary | ICD-10-CM

## 2023-08-20 ENCOUNTER — Ambulatory Visit: Payer: Commercial Managed Care - PPO | Admitting: Physician Assistant

## 2023-08-20 ENCOUNTER — Encounter: Payer: Self-pay | Admitting: Physician Assistant

## 2023-08-20 VITALS — BP 130/70 | HR 100 | Temp 98.2°F | Resp 16 | Ht 63.0 in | Wt 175.0 lb

## 2023-08-20 DIAGNOSIS — R5383 Other fatigue: Secondary | ICD-10-CM

## 2023-08-20 DIAGNOSIS — F411 Generalized anxiety disorder: Secondary | ICD-10-CM

## 2023-08-20 DIAGNOSIS — R7989 Other specified abnormal findings of blood chemistry: Secondary | ICD-10-CM | POA: Diagnosis not present

## 2023-08-20 DIAGNOSIS — L4050 Arthropathic psoriasis, unspecified: Secondary | ICD-10-CM | POA: Diagnosis not present

## 2023-08-20 NOTE — Progress Notes (Signed)
 [facility] [address] [place], [place] [address]  Internal MEDICINE  Office Visit Note  Patient Name: Savannah [name]  [id number]  [id number]  Date of Service: 08/20/2023  Chief Complaint  Patient presents with   Follow-up   Depression   Anxiety    HPI Pt is here for routine follow up -Following with psychiatry -dx with psoriatic arthritis, Rheumatology changed medication. No longer on methotrexate -Will having a biopsy/excision on the 22nd with GYN for vulvar lesion, had pap last week. States vulvar lesion started small but has grown and is uncomfortable. States lump beneath it now. States they will be excising. -Still taking modafinil, but still feels tired. Does flip sleep schedule. Works 3rd shift, but still feels like she could go to sleep halfway through shift. Has pulm/sleep follow up in June. -never had repeat thyroid labs done, will reorder now as this could also be impacting fatigue. Will also recheck iron and vit D. Not taking any supplement now.  Current Medication: Outpatient Encounter Medications as of 08/20/2023  Medication Sig   guselkumab (TREMFYA) 100 MG/ML pen Inject 100 mLs into the skin once a week.   leflunomide (ARAVA) 10 MG tablet Take 10 mg by mouth daily.   levonorgestrel (MIRENA) 20 MCG/24HR IUD 1 each once by Intrauterine route.   modafinil (PROVIGIL) 200 MG tablet TAKE 1 TABLET BY MOUTH EVERY DAY   omeprazole (PRILOSEC) 40 MG capsule TAKE 1 CAPSULE (40 MG TOTAL) BY MOUTH DAILY.   Roflumilast (ZORYVE) 0.3 % CREA Apply to aa's psoriasis QHS.   rosuvastatin (CRESTOR) 5 MG tablet TAKE 1 TABLET (5 MG TOTAL) BY MOUTH DAILY.   triamcinolone (KENALOG) 0.025 % cream Apply 1 Application topically 2 (two) times daily.   venlafaxine XR (EFFEXOR-XR) 37.5 MG 24 hr capsule TAKE 1 CAPSULE (37.5 MG TOTAL) BY MOUTH DAILY WITH BREAKFAST. TAKE ALONG WITH 75 MG DAILY   venlafaxine XR (EFFEXOR-XR) 75 MG 24 hr capsule TAKE 1 CAPSULE BY MOUTH DAILY WITH  BREAKFAST.   Vitamin D, Ergocalciferol, (DRISDOL) 1.25 MG (50000 UNIT) CAPS capsule Take 50,000 Units by mouth once a week.   [DISCONTINUED] folic acid (FOLVITE) 1 MG tablet Take by mouth.   [DISCONTINUED] methotrexate (RHEUMATREX) 2.5 MG tablet SMARTSIG:5 Tablet(s) By Mouth Once a Week   No facility-administered encounter medications on file as of 08/20/2023.    Surgical History: Past Surgical History:  Procedure Laterality Date   CARPAL TUNNEL RELEASE Right 09/29/2014   Procedure: CARPAL TUNNEL RELEASE;  Surgeon: Myra Rude, MD;  Location: [facility] ORS;  Service: Orthopedics;  Laterality: Right;   CESAREAN SECTION     IUD placement     MYRINGOTOMY WITH TUBE PLACEMENT      Medical History: Past Medical History:  Diagnosis Date   Anxiety    Depression    Eardrum rupture, left    Headache    Psoriatic arthritis (HCC)     Family History: Family History  Problem Relation Age of Onset   Healthy Mother    Bone cancer Maternal Grandfather    Mental illness Neg Hx     Social History   Socioeconomic History   Marital status: Married    Spouse name: katherine   Number of children: 3   Years of education: Not on file   Highest education level: High school graduate  Occupational History   Not on file  Tobacco Use   Smoking status: Some Days    Current packs/day: 0.00    Types: Cigarettes  Last attempt to quit: 06/14/2018    Years since quitting: 5.1    Passive exposure: Current   Smokeless tobacco: Never   Tobacco comments:    5-6 cigarettes daily  Vaping Use   Vaping status: Never Used  Substance and Sexual Activity   Alcohol use: Not Currently   Drug use: No   Sexual activity: Yes    Birth control/protection: I.U.D.  Other Topics Concern   Not on file  Social History Narrative   Not on file   Social Drivers of Health   Financial Resource Strain: Medium Risk (06/05/2023)   Overall Financial Resource Strain (CARDIA)    Difficulty of Paying Living Expenses:  Somewhat hard  Food Insecurity: No Food Insecurity (06/05/2023)   Hunger Vital Sign    Worried About Running Out of Food in the Last Year: Never true    Ran Out of Food in the Last Year: Never true  Transportation Needs: No Transportation Needs (06/05/2023)   PRAPARE - Administrator, Civil Service (Medical): No    Lack of Transportation (Non-Medical): No  Physical Activity: Inactive (06/05/2023)   Exercise Vital Sign    Days of Exercise per Week: 0 days    Minutes of Exercise per Session: 0 min  Stress: Stress Concern Present (06/05/2023)   [employer] of Occupational Health - Occupational Stress Questionnaire    Feeling of Stress : Rather much  Social Connections: Socially Isolated (06/05/2023)   Social Connection and Isolation Panel [NHANES]    Frequency of Communication with Friends and Family: Never    Frequency of Social Gatherings with Friends and Family: Never    Attends Religious Services: Never    Database administrator or Organizations: No    Attends Banker Meetings: Never    Marital Status: Married  Catering manager Violence: Not At Risk (06/05/2023)   Humiliation, Afraid, Rape, and Kick questionnaire    Fear of Current or Ex-Partner: No    Emotionally Abused: No    Physically Abused: No    Sexually Abused: No      Review of Systems  Constitutional:  Positive for fatigue. Negative for chills and unexpected weight change.  HENT:  Negative for congestion, rhinorrhea, sneezing and sore throat.   Eyes:  Negative for redness.  Respiratory:  Negative for cough, chest tightness and shortness of breath.   Cardiovascular:  Negative for chest pain and palpitations.  Gastrointestinal:  Negative for abdominal pain, constipation, diarrhea, nausea and vomiting.  Genitourinary:  Negative for dysuria and frequency.  Musculoskeletal:  Positive for arthralgias. Negative for back pain, joint swelling and neck pain.  Skin:  Negative for rash.   Neurological:  Negative for tremors.  Hematological:  Negative for adenopathy. Does not bruise/bleed easily.  Psychiatric/Behavioral:  Positive for sleep disturbance. Negative for behavioral problems (Depression) and suicidal ideas. The patient is nervous/anxious.     Vital Signs: BP 130/70   Pulse 100 Comment: 106  Temp 98.2 F (36.8 C)   Resp 16   Ht 5\' 3"  (1.6 m)   Wt 175 lb (79.4 kg)   SpO2 98%   BMI 31.00 kg/m    Physical Exam Vitals and nursing note reviewed.  Constitutional:      General: She is not in acute distress.    Appearance: Normal appearance. She is not ill-appearing.  HENT:     Head: Normocephalic and atraumatic.  Cardiovascular:     Rate and Rhythm: Normal rate and regular rhythm.  Pulmonary:  Effort: Pulmonary effort is normal. No respiratory distress.  Skin:    General: Skin is warm and dry.  Neurological:     Mental Status: She is alert and oriented to person, place, and time.  Psychiatric:        Mood and Affect: Mood normal.        Behavior: Behavior normal.        Assessment/Plan: 1. Low serum T4 level (Primary) Will recheck labs and treat accordingly - TSH+T4F+T3Free+ThyAbs+TPO+VD25  2. GAD (generalized anxiety disorder) Followed by psychiatry  3. Psoriatic arthritis [facility]) Seeing rheumatology, recently started new medication and will follow back up with them soon  4. High serum vitamin D Will recheck labs off supplement now - TSH+T4F+T3Free+ThyAbs+TPO+VD25  5. Other fatigue - TSH+T4F+T3Free+ThyAbs+TPO+VD25 - Fe+TIBC+Fer   General Counseling: Ricky verbalizes understanding of the findings of todays visit and agrees with plan of treatment. I have discussed any further diagnostic evaluation that may be needed or ordered today. We also reviewed her medications today. she has been encouraged to call the office with any questions or concerns that should arise related to todays visit.    Orders Placed This Encounter   Procedures   TSH+T4F+T3Free+ThyAbs+TPO+VD25   Fe+TIBC+Fer    No orders of the defined types were placed in this encounter.   This patient was seen by Lynn Ito, PA-C in collaboration with Dr. Beverely Risen as a part of collaborative care agreement.   Total time spent:30 Minutes Time spent includes review of chart, medications, test results, and follow up plan with the patient.      Dr Lyndon Code Internal medicine

## 2023-09-07 ENCOUNTER — Other Ambulatory Visit: Payer: Self-pay

## 2023-09-07 ENCOUNTER — Encounter: Payer: Self-pay | Admitting: Psychiatry

## 2023-09-07 ENCOUNTER — Ambulatory Visit (INDEPENDENT_AMBULATORY_CARE_PROVIDER_SITE_OTHER): Payer: Commercial Managed Care - PPO | Admitting: Psychiatry

## 2023-09-07 VITALS — BP 125/86 | HR 99 | Temp 98.0°F | Ht 63.0 in | Wt 171.8 lb

## 2023-09-07 DIAGNOSIS — F4010 Social phobia, unspecified: Secondary | ICD-10-CM | POA: Diagnosis not present

## 2023-09-07 DIAGNOSIS — F17200 Nicotine dependence, unspecified, uncomplicated: Secondary | ICD-10-CM | POA: Diagnosis not present

## 2023-09-07 DIAGNOSIS — G4701 Insomnia due to medical condition: Secondary | ICD-10-CM

## 2023-09-07 DIAGNOSIS — F411 Generalized anxiety disorder: Secondary | ICD-10-CM

## 2023-09-07 MED ORDER — VENLAFAXINE HCL ER 75 MG PO CP24: 75.0000 mg | ORAL_CAPSULE | Freq: Every day | ORAL | 0 refills | Status: DC

## 2023-09-07 NOTE — Progress Notes (Signed)
 [facility] MD OP Progress Note  09/07/2023 10:59 AM Savannah [name]  MRN:  [id number]  Chief Complaint:  Chief Complaint  Patient presents with   Follow-up   Anxiety   Depression   Medication Refill   Discussed the use of AI scribe software for clinical note transcription with the patient, who gave verbal consent to proceed.  History of Present Illness Savannah [name] is a 38 year old Caucasian female married, employed, lives in [place] has a history of generalized anxiety, social anxiety, insomnia, vitamin D  deficiency, hyperlipidemia, recent tympanoplasty, psoriatic arthritis who presents for follow-up.  She has a history of generalized anxiety and social anxiety, managed with venlafaxine  112.5 mg daily. Therapy sessions have been beneficial, but recent scheduling conflicts have led to longer intervals between sessions. She experiences ongoing anxiety related to situational stressors, including work-related changes and financial concerns.  Her insomnia is characterized by irregular sleep patterns. On days off, she averages about eight hours of sleep, but on workdays, her sleep is fragmented. She takes modafinil  on workdays as prescribed by her pulmonologist.  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  That triggered her anxiety although she is currently healing and anxiety is manageable.  She continues to smoke, averaging three cigarettes per day off work and up to five at work. She is taking over-the-counter vitamin D , which was previously prescribed. Her last thyroid  test in December showed a low free T4, and she is due for retesting.  Denies thoughts of self-harm or harm to others.     Visit Diagnosis:    ICD-10-CM   1. GAD (generalized anxiety disorder)  F41.1 venlafaxine  XR (EFFEXOR -XR) 75 MG 24 hr capsule    2. Social anxiety disorder  F40.10 venlafaxine  XR (EFFEXOR -XR) 75 MG 24 hr capsule    3. Insomnia due to medical condition  G47.01    Lack of  sleep hygiene, shift work problem    4. Tobacco use disorder  F17.200       Past Psychiatric History: I have reviewed past psychiatric history from progress note on 06/04/2021.  Past trials of sertraline -headaches, lorazepam-side effect, Klonopin-side effect, melatonin-did not work, Cymbalta -side effect, Ambien -excessive sleepiness  Past Medical History:  Past Medical History:  Diagnosis Date   Anxiety    Depression    Eardrum rupture, left    Headache    Psoriatic arthritis (HCC)     Past Surgical History:  Procedure Laterality Date   CARPAL TUNNEL RELEASE Right 09/29/2014   Procedure: CARPAL TUNNEL RELEASE;  Surgeon: Daris Edman, MD;  Location: [facility] ORS;  Service: Orthopedics;  Laterality: Right;   CESAREAN SECTION     IUD placement     MYRINGOTOMY WITH TUBE PLACEMENT      Family Psychiatric History: I have reviewed family psychiatric history from progress note on 06/04/2021.  Family History:  Family History  Problem Relation Age of Onset   Healthy Mother    Bone cancer Maternal Grandfather    Mental illness Neg Hx     Social History: I have reviewed social history from progress note on 06/04/2021. Social History   Socioeconomic History   Marital status: Married    Spouse name: Savannah [name]   Number of children: 3   Years of education: Not on file   Highest education level: High school graduate  Occupational History   Not on file  Tobacco Use   Smoking status: Some Days    Current packs/day: 0.00    Types: Cigarettes  Last attempt to quit: 06/14/2018    Years since quitting: 5.2    Passive exposure: Current   Smokeless tobacco: Never   Tobacco comments:    5-6 cigarettes daily  Vaping Use   Vaping status: Never Used  Substance and Sexual Activity   Alcohol use: Not Currently   Drug use: No   Sexual activity: Yes    Birth control/protection: I.U.D.  Other Topics Concern   Not on file  Social History Narrative   Not on file   Social Drivers of  Health   Financial Resource Strain: Medium Risk (06/05/2023)   Overall Financial Resource Strain (CARDIA)    Difficulty of Paying Living Expenses: Somewhat hard  Food Insecurity: No Food Insecurity (06/05/2023)   Hunger Vital Sign    Worried About Running Out of Food in the Last Year: Never true    Ran Out of Food in the Last Year: Never true  Transportation Needs: No Transportation Needs (06/05/2023)   PRAPARE - Administrator, Civil Service (Medical): No    Lack of Transportation (Non-Medical): No  Physical Activity: Inactive (06/05/2023)   Exercise Vital Sign    Days of Exercise per Week: 0 days    Minutes of Exercise per Session: 0 min  Stress: Stress Concern Present (06/05/2023)   [employer] of Occupational Health - Occupational Stress Questionnaire    Feeling of Stress : Rather much  Social Connections: Socially Isolated (06/05/2023)   Social Connection and Isolation Panel [NHANES]    Frequency of Communication with Friends and Family: Never    Frequency of Social Gatherings with Friends and Family: Never    Attends Religious Services: Never    Database administrator or Organizations: No    Attends Banker Meetings: Never    Marital Status: Married    Allergies:  Allergies  Allergen Reactions   Ibuprofen Other (See Comments)    Burns stomach    Metabolic Disorder Labs: No results found for: "HGBA1C", "MPG" No results found for: "PROLACTIN" Lab Results  Component Value Date   CHOL 187 05/08/2023   TRIG 237 (H) 05/08/2023   HDL 33 (L) 05/08/2023   LDLCALC 113 (H) 05/08/2023   LDLCALC 124 (H) 02/21/2022   Lab Results  Component Value Date   TSH 3.340 05/08/2023   TSH 2.670 02/21/2022    Therapeutic Level Labs: No results found for: "LITHIUM" No results found for: "VALPROATE" No results found for: "CBMZ"  Current Medications: Current Outpatient Medications  Medication Sig Dispense Refill   guselkumab (TREMFYA) 100 MG/ML pen  Inject 100 mLs into the skin once a week.     leflunomide (ARAVA) 10 MG tablet Take 10 mg by mouth daily.     levonorgestrel (MIRENA) 20 MCG/24HR IUD 1 each once by Intrauterine route.     modafinil  (PROVIGIL ) 200 MG tablet TAKE 1 TABLET BY MOUTH EVERY DAY 9 tablet 0   omeprazole  (PRILOSEC) 40 MG capsule TAKE 1 CAPSULE (40 MG TOTAL) BY MOUTH DAILY. 30 capsule 1   Roflumilast  (ZORYVE ) 0.3 % CREA Apply to aa's psoriasis QHS. 60 g 5   rosuvastatin  (CRESTOR ) 5 MG tablet TAKE 1 TABLET (5 MG TOTAL) BY MOUTH DAILY. 30 tablet 3   triamcinolone  (KENALOG ) 0.025 % cream Apply 1 Application topically 2 (two) times daily.     venlafaxine  XR (EFFEXOR -XR) 37.5 MG 24 hr capsule TAKE 1 CAPSULE (37.5 MG TOTAL) BY MOUTH DAILY WITH BREAKFAST. TAKE ALONG WITH 75 MG DAILY 90 capsule 0  Vitamin D , Ergocalciferol , (DRISDOL ) 1.25 MG (50000 UNIT) CAPS capsule Take 50,000 Units by mouth once a week.     venlafaxine  XR (EFFEXOR -XR) 75 MG 24 hr capsule Take 1 capsule (75 mg total) by mouth daily with breakfast. Take along with 37.5 mg daily 90 capsule 0   No current facility-administered medications for this visit.     Musculoskeletal: Strength & Muscle Tone: within normal limits Gait & Station: normal Patient leans: N/A  Psychiatric Specialty Exam: Review of Systems  Psychiatric/Behavioral:  Positive for sleep disturbance. The patient is nervous/anxious.     Blood pressure 125/86, pulse 99, temperature 98 F (36.7 C), temperature source Temporal, height 5\' 3"  (1.6 m), weight 171 lb 12.8 oz (77.9 kg).Body mass index is 30.43 kg/m.  General Appearance: Casual  Eye Contact:  Fair  Speech:  Clear and Coherent  Volume:  Normal  Mood:  Anxious manageable  Affect:  Appropriate  Thought Process:  Goal Directed and Descriptions of Associations: Intact  Orientation:  Full (Time, Place, and Person)  Thought Content: Logical   Suicidal Thoughts:  No  Homicidal Thoughts:  No  Memory:  Immediate;   Fair Recent;    Fair Remote;   Fair  Judgement:  Fair  Insight:  Fair  Psychomotor Activity:  Normal  Concentration:  Concentration: Fair and Attention Span: Fair  Recall:  [employer] of Knowledge: Fair  Language: Fair  Akathisia:  No  Handed:  Right  AIMS (if indicated): not done  Assets:  Desire for Improvement Housing Intimacy Talents/Skills Transportation  ADL's:  Intact  Cognition: WNL  Sleep:   restless   Screenings: Geneticist, molecular Office Visit from 12/23/2021 in [facility] Office Visit from 10/18/2021 in [facility] Office Visit from 09/05/2021 in [facility]  AIMS Total Score 0 0 0      GAD-7    Flowsheet Row Counselor from 07/31/2023 in [facility] Counselor from 06/05/2023 in [facility] Office Visit from 05/14/2023 in [facility] Video Visit from 02/20/2023 in [facility] Office Visit from 11/20/2022 in [facility]  Total GAD-7 Score 3 7 8 5 6       PHQ2-9    Flowsheet Row Counselor from 07/31/2023 in [facility] Office Visit from 07/06/2023 in [facility] Counselor from 06/05/2023 in [facility] Office Visit from 05/14/2023 in [facility] Video Visit from 02/20/2023 in [facility]  PHQ-2 Total Score 0 2 3 4 1   PHQ-9 Total Score 3 8 12 10  --      Flowsheet Row Office Visit from 09/07/2023 in [facility] Office Visit from 07/06/2023 in [facility] Counselor from 06/05/2023 in [facility]  C-SSRS RISK CATEGORY No Risk No Risk No Risk        Assessment and Plan:Tonisha Khosravi is a 38 year old Caucasian female, lives in [place], currently with generalized anxiety disorder, social anxiety disorder, tobacco use disorder  Assessment & Plan Generalized anxiety disorder-improving Generalized anxiety disorder with ongoing situational stressors, including work-related changes and financial concerns. Engaged in therapy sessions approximately every two weeks, finding her helpful for gaining perspective.   - Continue  Venlafaxine  112.5 mg daily - Continue therapy sessions with Ms. Deetta Farrow - Encouraged focus on daily challenges and family time.    Social anxiety disorder-stable Social anxiety disorder managed with venlafaxine  and therapy.   - Continue Venlafaxine  112.5 mg.daily - Continue therapy sessions.    Insomnia-unstable Insomnia with irregular sleep patterns, improved with sleep hygiene. Reports sleeping more, averaging eight hours when possible. Continues modafinil  on workdays as prescribed by pulmonologist.   - Continue Modafinil  as prescribed by primary provider.   - Encouraged continued practice of sleep hygiene.    Tobacco use disorder-unstable Continues to smoke approximately 3-5 cigarettes per day depending on work schedule. Acknowledged as a habit rather than a necessity. Discussed long-term health risks and secondhand smoke effects.   - Encouraged to work with therapist on smoking cessation.   - Provided resources and encouraged research on smoking cessation. - Provided counseling for 1 minute.  Follow-up Follow-up in clinic in 3 months or sooner if needed.   Collaboration of Care: Collaboration of Care: Referral or follow-up with counselor/therapist AEB encouraged to continue CBT  Patient/Guardian was advised Release of Information must be obtained prior to any record release in order to collaborate their care  with an outside provider. Patient/Guardian was advised if they have not already done so to contact the registration department to sign all necessary forms in order for us  to release information regarding their care.   Consent: Patient/Guardian gives verbal consent for treatment and assignment of benefits for services provided during this visit. Patient/Guardian expressed understanding and agreed to proceed.   This note was generated in part or whole with voice recognition software. Voice recognition is usually quite accurate but there are transcription errors that can and very often do occur. I apologize for any typographical errors that were not detected and corrected.    Conny Moening, MD 09/07/2023, 10:59 AM

## 2023-09-07 NOTE — Patient Instructions (Signed)
Tobacco Use Disorder Tobacco use disorder (TUD) occurs when a person craves, seeks, and uses tobacco, regardless of the consequences. This disorder can cause problems with mental and physical health. It can affect your ability to have healthy relationships. It can also keep you from meeting your responsibilities at work, home, or school. Tobacco products contain a dangerous chemical called nicotine. Nicotine triggers hormones that make the body feel stimulated and works on areas of the brain that make a person feel good. These effects can make the person depend on nicotine, which makes it hard to quit tobacco. Tobacco may be: Smoked as a cigarette or cigar. Inhaled using vaping devices, such as e-cigarettes. Smoked in a pipe or hookah. Chewed as smokeless tobacco. Inhaled into the nostrils as snuff. What are the causes? This condition is caused by using nicotine. Nicotine causes changes in your brain that make you want more and more. This is called addiction. This can make it hard to stop using tobacco once you start. What are the signs or symptoms? Symptoms of TUD may include: Being unable to stop your tobacco use even after planned quit attempts. Spending an abnormal amount of time getting or using tobacco. Craving tobacco. Tobacco use that: Interferes with your work, school, or home life. Interferes with your personal and social relationships. Makes you give up activities that you once enjoyed or found important. Using tobacco even though you know that it is: Dangerous or bad for your health or someone else's health. Causing problems in your life. Needing more and more of the substance to get the same effect (developing tolerance). Using the substance to avoid unpleasant symptoms if you do not use the substance (withdrawal). How is this diagnosed? This condition may be diagnosed based on: Your current and past tobacco use. Your health care provider may ask questions about how your  tobacco use affects your life. A physical exam. You may be diagnosed with TUD if you have at least two symptoms within a 12-month period. How is this treated? This condition is treated by stopping tobacco use. Many people are unable to quit on their own and need help. Treatment may include: Nicotine replacement therapy (NRT). NRT provides nicotine without the other harmful chemicals in tobacco. NRT gradually lowers the dosage of nicotine in the body and reduces withdrawal symptoms. NRT is available as: Over-the-counter gums, lozenges, and skin patches. Prescription mouth inhalers and nasal sprays. Medicine that acts on the brain to reduce cravings and withdrawal symptoms. A type of talk therapy that examines your triggers for tobacco use, how to avoid them, and how to cope with cravings (behavioral therapy). Hypnosis. This may help with withdrawal symptoms. Joining a support group for people coping with TUD. The best treatment for TUD is usually a combination of medicine, talk therapy, and support groups. Recovery can be a long process. Many people start using tobacco again after stopping (relapse). If you relapse, it does not mean that treatment will not work. Follow these instructions at home:  Lifestyle Do not use any products that contain nicotine or tobacco. These products include cigarettes, chewing tobacco, and vaping devices, such as e-cigarettes. If you need help quitting, ask your health care provider. Avoid things that trigger tobacco use as much as you can. Triggers include people and situations that usually cause you to use tobacco. Avoid drinks that contain caffeine, including coffee. These may worsen some withdrawal symptoms. Find ways to manage stress. Wanting to smoke may cause stress, and stress can make you want   to smoke. Relaxation techniques such as deep breathing, meditation, and yoga may help. Attend support groups as needed. These groups are an important part of long-term  recovery for many people. General instructions Take over-the-counter and prescription medicines only as told by your health care provider. Check with your health care provider before taking any new prescription or over-the-counter medicines. Decide on a friend, family member, or smoking quit-line (such as 1-800-QUIT-NOW in the [place] that you can call or text when you feel the urge to smoke or when you need help coping with cravings. Keep all follow-up visits. This is important. Contact a health care provider if: You are not able to take your medicines as told by your health care provider. Your symptoms get worse, even with treatment. Summary Tobacco use disorder (TUD) occurs when a person craves, seeks, and uses tobacco regardless of the consequences. This condition may be diagnosed based on your current and past tobacco use and a physical exam. Many people are unable to quit on their own and need help. Recovery can be a long process. The most effective treatment for TUD is usually a combination of medicine, talk therapy, and support groups. This information is not intended to replace advice given to you by your health care provider. Make sure you discuss any questions you have with your health care provider. Document Revised: 04/23/2021 Document Reviewed: 04/23/2021 Elsevier Patient Education  2024 Elsevier Inc.  

## 2023-09-12 ENCOUNTER — Other Ambulatory Visit: Payer: Self-pay | Admitting: Nurse Practitioner

## 2023-09-12 DIAGNOSIS — K219 Gastro-esophageal reflux disease without esophagitis: Secondary | ICD-10-CM

## 2023-09-12 LAB — IRON,TIBC AND FERRITIN PANEL
Ferritin: 119 ng/mL (ref 15–150)
Iron Saturation: 40 % (ref 15–55)
Iron: 135 ug/dL (ref 27–159)
Total Iron Binding Capacity: 338 ug/dL (ref 250–450)
UIBC: 203 ug/dL (ref 131–425)

## 2023-09-12 LAB — TSH+T4F+T3FREE+THYABS+TPO+VD25
Free T4: 0.69 ng/dL — ABNORMAL LOW (ref 0.82–1.77)
T3, Free: 3.3 pg/mL (ref 2.0–4.4)
TSH: 1.97 u[IU]/mL (ref 0.450–4.500)
Thyroglobulin Antibody: <1 [IU]/mL (ref 0.0–0.9)
Thyroperoxidase Ab SerPl-aCnc: 15 [IU]/mL (ref 0–34)
Vit D, 25-Hydroxy: 63 ng/mL (ref 30.0–100.0)

## 2023-09-15 ENCOUNTER — Other Ambulatory Visit: Payer: Self-pay | Admitting: Physician Assistant

## 2023-09-15 ENCOUNTER — Telehealth: Payer: Self-pay

## 2023-09-15 DIAGNOSIS — E039 Hypothyroidism, unspecified: Secondary | ICD-10-CM

## 2023-09-15 MED ORDER — LEVOTHYROXINE SODIUM 50 MCG PO TABS: 50.0000 ug | ORAL_TABLET | Freq: Every day | ORAL | 3 refills | Status: AC

## 2023-09-15 NOTE — Telephone Encounter (Signed)
 Sent mychart message to patient

## 2023-09-18 ENCOUNTER — Ambulatory Visit (INDEPENDENT_AMBULATORY_CARE_PROVIDER_SITE_OTHER): Admitting: Professional Counselor

## 2023-09-18 DIAGNOSIS — F411 Generalized anxiety disorder: Secondary | ICD-10-CM | POA: Diagnosis not present

## 2023-09-18 NOTE — Progress Notes (Signed)
  THERAPIST PROGRESS NOTE  Virtual Visit via Video Note  I connected with Wilhelmenia Harada on 09/18/23 at 11:00 AM EDT by a video enabled telemedicine application and verified that I am speaking with the correct person using two identifiers.  Location: Patient: Community Provider: Home office   I discussed the limitations of evaluation and management by telemedicine and the availability of in person appointments. The patient expressed understanding and agreed to proceed.   I discussed the assessment and treatment plan with the patient. The patient was provided an opportunity to ask questions and all were answered. The patient agreed with the plan and demonstrated an understanding of the instructions.   The patient was advised to call back or seek an in-person evaluation if the symptoms worsen or if the condition fails to improve as anticipated.  I provided 54 minutes of non-face-to-face time during this encounter. Len Quale, [facility]  Session Time: 11:00 AM - 11:54 AM  Participation Level: Active  Behavioral Response: Casual, Alert, Euthymic  Type of Therapy: Individual Therapy  Treatment Goals addressed: Active OP Depression  LTG: "I would say to process and share emotions in a healthy manner because I know I do not."                 Start:  06/18/23    Expected End:  06/16/24      STG: "Continuous self-confidence." To improve concept of self AEB utilizing daily affirmations, restructuring core beliefs about self, and a self-report on improvements over the next 90 days.     STG: "I guess to be better at expressing how I feel." To improve emotion identification and communication AEB psychoeducation on emotions and utilizing role-playing and communication skills to share feelings 3 out of 7 days a week.    STG: Maintain abstinence from substance use AEB utilizing positive replacement behaviors to manage pain over the next 90 days    ProgressTowards Goals:  Progressing  Interventions: Motivational Interviewing and Supportive  Summary: Izella Choyce is a 38 y.o. female who presents with a history of anxiety and depression. She appeared alert and oriented x5. She provided updates on health issues, work, and family issues. Kenlie engaged in discussion about managing an additional family member in the house and setting boundaries/expectations.   Therapist Response: Conducted session with Odilia Bennett. Began session with check-in/update since previous session. Utilized empathetic and reflective listening. Used open-ended questions to facilitate discussion and summarized Gracin's thoughts/feelings. Praised Rai about setting boundaries and expectations with family. Used gentle confrontation about consent with birthday tradition. Scheduled additional appointment and concluded session.   Suicidal/Homicidal: No  Plan: Return again in 2 weeks.  Diagnosis: GAD (generalized anxiety disorder)  Collaboration of Care: Medication Management AEB chart review  Patient/Guardian was advised Release of Information must be obtained prior to any record release in order to collaborate their care with an outside provider. Patient/Guardian was advised if they have not already done so to contact the registration department to sign all necessary forms in order for us  to release information regarding their care.   Consent: Patient/Guardian gives verbal consent for treatment and assignment of benefits for services provided during this visit. Patient/Guardian expressed understanding and agreed to proceed.   Len Quale, [facility] 09/18/2023

## 2023-10-02 ENCOUNTER — Ambulatory Visit (INDEPENDENT_AMBULATORY_CARE_PROVIDER_SITE_OTHER): Admitting: Professional Counselor

## 2023-10-02 DIAGNOSIS — F411 Generalized anxiety disorder: Secondary | ICD-10-CM

## 2023-10-02 NOTE — Progress Notes (Signed)
  THERAPIST PROGRESS NOTE  Virtual Visit via Video Note  I connected with Wilhelmenia Harada on 10/02/23 at  9:00 AM EDT by a video enabled telemedicine application and verified that I am speaking with the correct person using two identifiers.  Location: Patient: Home Provider: Office   I discussed the limitations of evaluation and management by telemedicine and the availability of in person appointments. The patient expressed understanding and agreed to proceed.   I discussed the assessment and treatment plan with the patient. The patient was provided an opportunity to ask questions and all were answered. The patient agreed with the plan and demonstrated an understanding of the instructions.   The patient was advised to call back or seek an in-person evaluation if the symptoms worsen or if the condition fails to improve as anticipated.  I provided 26 minutes of non-face-to-face time during this encounter. Len Quale, [facility]  Session Time: 9:01 AM - 9:27 AM   Participation Level: Active  Behavioral Response: Casual, Alert, Euthymic  Type of Therapy: Individual Therapy  Treatment Goals addressed: Active OP Depression  LTG: "I would say to process and share emotions in a healthy manner because I know I do not."                 Start:  06/18/23    Expected End:  06/16/24      STG: "Continuous self-confidence." To improve concept of self AEB utilizing daily affirmations, restructuring core beliefs about self, and a self-report on improvements over the next 90 days.     STG: "I guess to be better at expressing how I feel." To improve emotion identification and communication AEB psychoeducation on emotions and utilizing role-playing and communication skills to share feelings 3 out of 7 days a week.    STG: Maintain abstinence from substance use AEB utilizing positive replacement behaviors to manage pain over the next 90 days      ProgressTowards Goals:  Progressing  Interventions: CBT, Motivational Interviewing, and Supportive  Summary: Jerrianne Hartin is a 38 y.o. female who presents with a history of anxiety and depression. She appeared alert and oriented x5. She stated things are going well. She has been getting bored at work but is unsure if she wants to switch jobs or not. She engaged in reviewing her treatment plan and noted progress on goals and areas for continued improvement. She was receptive to feelings wheel to help identify specific emotions.   Therapist Response: Conducted session with Odilia Bennett. Began session with check-in/update since previous session. Utilized empathetic and reflective listening. Reviewed treatment plan with input from [place] on current strengths, needs, and progress towards goals. Explained feelings wheel to help identify emotions and provided psychoeducation on spectrum on emotions and natural vs manufactured emotions. Scheduled additional appointment and concluded session.   Suicidal/Homicidal: No  Plan: Return again in 4 weeks.  Diagnosis: GAD (generalized anxiety disorder)  Collaboration of Care: Medication Management AEB chart review  Patient/Guardian was advised Release of Information must be obtained prior to any record release in order to collaborate their care with an outside provider. Patient/Guardian was advised if they have not already done so to contact the registration department to sign all necessary forms in order for us  to release information regarding their care.   Consent: Patient/Guardian gives verbal consent for treatment and assignment of benefits for services provided during this visit. Patient/Guardian expressed understanding and agreed to proceed.   Len Quale, [facility] 10/02/2023

## 2023-10-22 ENCOUNTER — Encounter: Payer: Self-pay | Admitting: Nurse Practitioner

## 2023-10-22 ENCOUNTER — Ambulatory Visit: Payer: Commercial Managed Care - PPO | Admitting: Nurse Practitioner

## 2023-10-22 VITALS — BP 130/88 | HR 99 | Temp 98.4°F | Resp 16 | Ht 63.0 in | Wt 173.0 lb

## 2023-10-22 DIAGNOSIS — G4726 Circadian rhythm sleep disorder, shift work type: Secondary | ICD-10-CM

## 2023-10-22 DIAGNOSIS — R5382 Chronic fatigue, unspecified: Secondary | ICD-10-CM | POA: Diagnosis not present

## 2023-10-22 DIAGNOSIS — L4050 Arthropathic psoriasis, unspecified: Secondary | ICD-10-CM

## 2023-10-22 MED ORDER — BELSOMRA 10 MG PO TABS: 10.0000 mg | ORAL_TABLET | Freq: Every evening | ORAL | 2 refills | Status: AC | PRN

## 2023-10-22 MED ORDER — MODAFINIL 200 MG PO TABS: 200.0000 mg | ORAL_TABLET | Freq: Every day | ORAL | 2 refills | Status: DC

## 2023-10-22 NOTE — Progress Notes (Signed)
 [facility] [address] [place], [place] [address]  Internal MEDICINE  Office Visit Note  Patient Name: Savannah [name]  [id number]  [id number]  Date of Service: 10/22/2023  Chief Complaint  Patient presents with   Follow-up    Hypersomnia and excessive daytime sleepiness.     HPI [name] presents for a follow-up visit for insomnia, hypersomnia and excessive daytime sleepiness.  Insomnia and shift work sleep disorder -- takes modafinil  but this is not helping very much.  Chronic fatigue related to her psoriatic arthritis.  Joint pains which are disturbing her sleep -- on tremfya injections for her psoriatic arthritis.     Current Medication: Outpatient Encounter Medications as of 10/22/2023  Medication Sig   Suvorexant (BELSOMRA) 10 MG TABS Take 1 tablet (10 mg total) by mouth at bedtime as needed.   guselkumab (TREMFYA) 100 MG/ML pen Inject 100 mLs into the skin once a week.   leflunomide (ARAVA) 10 MG tablet Take 10 mg by mouth daily.   levonorgestrel (MIRENA) 20 MCG/24HR IUD 1 each once by Intrauterine route.   levothyroxine  (SYNTHROID ) 50 MCG tablet Take 1 tablet (50 mcg total) by mouth daily.   modafinil  (PROVIGIL ) 200 MG tablet Take 1 tablet (200 mg total) by mouth daily.   omeprazole  (PRILOSEC) 40 MG capsule TAKE 1 CAPSULE (40 MG TOTAL) BY MOUTH DAILY.   Roflumilast  (ZORYVE ) 0.3 % CREA Apply to aa's psoriasis QHS.   rosuvastatin  (CRESTOR ) 5 MG tablet TAKE 1 TABLET (5 MG TOTAL) BY MOUTH DAILY.   triamcinolone  (KENALOG ) 0.025 % cream Apply 1 Application topically 2 (two) times daily.   venlafaxine  XR (EFFEXOR -XR) 37.5 MG 24 hr capsule TAKE 1 CAPSULE (37.5 MG TOTAL) BY MOUTH DAILY WITH BREAKFAST. TAKE ALONG WITH 75 MG DAILY   venlafaxine  XR (EFFEXOR -XR) 75 MG 24 hr capsule Take 1 capsule (75 mg total) by mouth daily with breakfast. Take along with 37.5 mg daily   Vitamin D , Ergocalciferol , (DRISDOL ) 1.25 MG (50000 UNIT) CAPS capsule Take 50,000 Units by mouth  once a week.   [DISCONTINUED] modafinil  (PROVIGIL ) 200 MG tablet TAKE 1 TABLET BY MOUTH EVERY DAY   No facility-administered encounter medications on file as of 10/22/2023.    Surgical History: Past Surgical History:  Procedure Laterality Date   CARPAL TUNNEL RELEASE Right 09/29/2014   Procedure: CARPAL TUNNEL RELEASE;  Surgeon: Daris Edman, MD;  Location: [facility] ORS;  Service: Orthopedics;  Laterality: Right;   CESAREAN SECTION     IUD placement     MYRINGOTOMY WITH TUBE PLACEMENT      Medical History: Past Medical History:  Diagnosis Date   Anxiety    Depression    Eardrum rupture, left    Headache    Psoriatic arthritis (HCC)     Family History: Family History  Problem Relation Age of Onset   Healthy Mother    Bone cancer Maternal Grandfather    Mental illness Neg Hx     Social History   Socioeconomic History   Marital status: Married    Spouse name: katherine   Number of children: 3   Years of education: Not on file   Highest education level: High school graduate  Occupational History   Not on file  Tobacco Use   Smoking status: Some Days    Current packs/day: 0.00    Types: Cigarettes    Last attempt to quit: 06/14/2018    Years since quitting: 5.3    Passive exposure: Current   Smokeless tobacco: Never  Tobacco comments:    5-6 cigarettes daily  Vaping Use   Vaping status: Never Used  Substance and Sexual Activity   Alcohol use: Not Currently   Drug use: No   Sexual activity: Yes    Birth control/protection: I.U.D.  Other Topics Concern   Not on file  Social History Narrative   Not on file   Social Drivers of Health   Financial Resource Strain: Medium Risk (06/05/2023)   Overall Financial Resource Strain (CARDIA)    Difficulty of Paying Living Expenses: Somewhat hard  Food Insecurity: No Food Insecurity (06/05/2023)   Hunger Vital Sign    Worried About Running Out of Food in the Last Year: Never true    Ran Out of Food in the Last Year:  Never true  Transportation Needs: No Transportation Needs (06/05/2023)   PRAPARE - Administrator, Civil Service (Medical): No    Lack of Transportation (Non-Medical): No  Physical Activity: Inactive (06/05/2023)   Exercise Vital Sign    Days of Exercise per Week: 0 days    Minutes of Exercise per Session: 0 min  Stress: Stress Concern Present (06/05/2023)   [employer] of Occupational Health - Occupational Stress Questionnaire    Feeling of Stress : Rather much  Social Connections: Socially Isolated (06/05/2023)   Social Connection and Isolation Panel    Frequency of Communication with Friends and Family: Never    Frequency of Social Gatherings with Friends and Family: Never    Attends Religious Services: Never    Database administrator or Organizations: No    Attends Banker Meetings: Never    Marital Status: Married  Catering manager Violence: Not At Risk (06/05/2023)   Humiliation, Afraid, Rape, and Kick questionnaire    Fear of Current or Ex-Partner: No    Emotionally Abused: No    Physically Abused: No    Sexually Abused: No      Review of Systems  Constitutional:  Positive for fatigue. Negative for appetite change and unexpected weight change.  HENT: Negative.    Respiratory: Negative.  Negative for cough, chest tightness, shortness of breath and wheezing.   Cardiovascular: Negative.  Negative for chest pain and palpitations.  Gastrointestinal: Negative.   Genitourinary: Negative.   Musculoskeletal: Negative.   Psychiatric/Behavioral:  Positive for sleep disturbance. Negative for self-injury and suicidal ideas. The patient is nervous/anxious.     Vital Signs: BP 130/88   Pulse 99   Temp 98.4 F (36.9 C)   Resp 16   Wt 173 lb (78.5 kg)   SpO2 95%   BMI 30.65 kg/m    Physical Exam Vitals reviewed.  Constitutional:      Appearance: Normal appearance. She is obese.  HENT:     Head: Normocephalic and atraumatic.   Eyes:      Pupils: Pupils are equal, round, and reactive to light.    Cardiovascular:     Rate and Rhythm: Normal rate and regular rhythm.  Pulmonary:     Effort: Pulmonary effort is normal. No respiratory distress.   Neurological:     Mental Status: She is alert and oriented to person, place, and time.   Psychiatric:        Mood and Affect: Mood normal.        Behavior: Behavior normal.        Assessment/Plan: 1. Circadian rhythm sleep disorder, shift work type (Primary) Continue modafinil  as prescribed. Will try belsomra at bedtime to help with sleep  induction and sleep maintenance.  - Suvorexant (BELSOMRA) 10 MG TABS; Take 1 tablet (10 mg total) by mouth at bedtime as needed.  Dispense: 30 tablet; Refill: 2 - modafinil  (PROVIGIL ) 200 MG tablet; Take 1 tablet (200 mg total) by mouth daily.  Dispense: 30 tablet; Refill: 2  2. Chronic fatigue Contributing to excessive daytime sleepiness and hypersomnia, continue treatment of psoriatic arthritis via rheumatology.   3. Psoriatic arthritis (HCC) Continue to follow up with rheumatology.    General Counseling: yuliza cara understanding of the findings of todays visit and agrees with plan of treatment. I have discussed any further diagnostic evaluation that may be needed or ordered today. We also reviewed her medications today. she has been encouraged to call the office with any questions or concerns that should arise related to todays visit.    No orders of the defined types were placed in this encounter.   Meds ordered this encounter  Medications   Suvorexant (BELSOMRA) 10 MG TABS    Sig: Take 1 tablet (10 mg total) by mouth at bedtime as needed.    Dispense:  30 tablet    Refill:  2    New medication, fill script today.   modafinil  (PROVIGIL ) 200 MG tablet    Sig: Take 1 tablet (200 mg total) by mouth daily.    Dispense:  30 tablet    Refill:  2    For future refills, This request is for a new prescription for a controlled  substance as required by Federal/State law.    Return in about 4 weeks (around 11/19/2023) for F/U, Doyl Bitting PCP, eval new med belsomra.   Total time spent:30 Minutes Time spent includes review of chart, medications, test results, and follow up plan with the patient.   [place] Controlled Substance Database was reviewed by me.  This patient was seen by Laurence Pons, FNP-C in collaboration with Dr. Verneta Gone as a part of collaborative care agreement.   Chanin Frumkin R. Bobbi Burow, MSN, FNP-C Internal medicine

## 2023-10-30 ENCOUNTER — Ambulatory Visit (INDEPENDENT_AMBULATORY_CARE_PROVIDER_SITE_OTHER): Admitting: Professional Counselor

## 2023-10-30 DIAGNOSIS — F411 Generalized anxiety disorder: Secondary | ICD-10-CM | POA: Diagnosis not present

## 2023-10-30 DIAGNOSIS — F331 Major depressive disorder, recurrent, moderate: Secondary | ICD-10-CM

## 2023-10-30 NOTE — Progress Notes (Unsigned)
  THERAPIST PROGRESS NOTE  Virtual Visit via Video Note  I connected with Savannah [name] on 11/02/23 at  9:00 AM EDT by a video enabled telemedicine application and verified that I am speaking with the correct person using two identifiers.  Location: Patient: Home Provider: Remote office   I discussed the limitations of evaluation and management by telemedicine and the availability of in person appointments. The patient expressed understanding and agreed to proceed.   I discussed the assessment and treatment plan with the patient. The patient was provided an opportunity to ask questions and all were answered. The patient agreed with the plan and demonstrated an understanding of the instructions.   The patient was advised to call back or seek an in-person evaluation if the symptoms worsen or if the condition fails to improve as anticipated.  I provided 47 minutes of non-face-to-face time during this encounter. [name], [facility]  Session Time: 9:00 AM - 9:47 AM   Participation Level: Active  Behavioral Response: Casual, Alert, Euthymic  Type of Therapy: Individual Therapy  Treatment Goals addressed: Active OP Depression  LTG: I would say to process and share emotions in a healthy manner because I know I do not.                 Start:  06/18/23    Expected End:  06/16/24      STG: Continuous self-confidence. To improve concept of self AEB utilizing daily affirmations, restructuring core beliefs about self, and a self-report on improvements over the next 90 days.     STG: I guess to be better at expressing how I feel. To improve emotion identification and communication AEB psychoeducation on emotions and utilizing role-playing and communication skills to share feelings 3 out of 7 days a week.    STG: Maintain abstinence from substance use AEB utilizing positive replacement behaviors to manage pain over the next 90 days    ProgressTowards Goals:  Progressing  Interventions: CBT, Motivational Interviewing, and Supportive  Summary: Savannah [name] is a 38 y.o. female who presents with a history of anxiety and depression. She appeared alert and oriented x5. She provided updates with family and work. She also discussed health issues and chronic pain. Savannah [name] was receptive to searching for a holistic doctor. She continues to work on her anxiety symptoms. She appeared ambivalent about guided meditations but is willing to try.   Therapist Response: Conducted session with Savannah. Began session with check-in/update since previous session. Utilized empathetic and reflective listening. Used open-ended questions to facilitate discussion and summarized Rasheen's thoughts/feelings. Explored ways to seek holistic care. Explaind guided meditations to assist with chronic pain relief and anxiety. Discussed potential for discharge due to progress in therapy. Confirmed next appointment and concluded session.   Suicidal/Homicidal: No  Plan: Return again in 3 weeks.  Diagnosis: GAD (generalized anxiety disorder)  Major depressive disorder, recurrent episode, moderate (HCC)  Collaboration of Care: Medication Management AEB chart review  Patient/Guardian was advised Release of Information must be obtained prior to any record release in order to collaborate their care with an outside provider. Patient/Guardian was advised if they have not already done so to contact the registration department to sign all necessary forms in order for us  to release information regarding their care.   Consent: Patient/Guardian gives verbal consent for treatment and assignment of benefits for services provided during this visit. Patient/Guardian expressed understanding and agreed to proceed.   [name], [facility] 11/02/2023

## 2023-11-04 ENCOUNTER — Encounter: Payer: Self-pay | Admitting: Psychiatry

## 2023-11-04 ENCOUNTER — Ambulatory Visit (INDEPENDENT_AMBULATORY_CARE_PROVIDER_SITE_OTHER): Admitting: Psychiatry

## 2023-11-04 ENCOUNTER — Other Ambulatory Visit: Payer: Self-pay

## 2023-11-04 VITALS — BP 125/89 | HR 93 | Temp 97.9°F | Ht 63.0 in | Wt 169.2 lb

## 2023-11-04 DIAGNOSIS — F411 Generalized anxiety disorder: Secondary | ICD-10-CM

## 2023-11-04 DIAGNOSIS — G4701 Insomnia due to medical condition: Secondary | ICD-10-CM

## 2023-11-04 DIAGNOSIS — F4010 Social phobia, unspecified: Secondary | ICD-10-CM | POA: Diagnosis not present

## 2023-11-04 DIAGNOSIS — F17200 Nicotine dependence, unspecified, uncomplicated: Secondary | ICD-10-CM | POA: Diagnosis not present

## 2023-11-04 MED ORDER — VENLAFAXINE HCL ER 37.5 MG PO CP24: 37.5000 mg | ORAL_CAPSULE | Freq: Every day | ORAL | 0 refills | Status: DC

## 2023-11-04 NOTE — Patient Instructions (Signed)
 Smoking and Musculoskeletal Health Smoking is bad for your health. Most people know that smoking causes lung disease, heart disease, and cancer. But people may not realize that it also affects their bones, muscles, and joints (musculoskeletal system). When you smoke, the effects on your lungs and heart result in less oxygen for your musculoskeletal system. This can lead to poor bone and joint health. How can smoking affect my musculoskeletal health? Smoking can: Increase your risk of having weak, thin bones (osteoporosis). Elderly smokers are at higher risk for bone fractures related to osteoporosis. Decrease the ability of bone-forming cells to make and replace bone (in addition to reducing oxygen and blood flow). Reduce your body's ability to absorb calcium  from your diet. Less calcium  means weaker bones. Interfere with the breakdown of the female hormone estrogen. Smoking lowers estrogen, which is a hormone that helps keep bones strong. Women who smoke may have earlier menopause. Menopause is a risk factor for osteoporosis. Weaken the tissues that attach bones to muscles (tendons). This can lead to shoulder, back, and other joint injuries. Increase your risk of rheumatoid arthritis or make the condition worse if you already have it. Slow down healing and increase your risk of infection and other complications if you have a bone fracture or surgery that involves your musculoskeletal system. What actions can I take to prevent musculoskeletal problems? Quit smoking     Do not use any products that contain nicotine or tobacco. These products include cigarettes, chewing tobacco, and vaping devices, such as e-cigarettes. These can delay bone healing. If you need help quitting, ask your health care provider. Even stopping later in life can improve musculoskeletal health. Do not replace cigarette smoking with e-cigarettes. The safety of e-cigarettes is not known, and some may contain harmful  chemicals. Make a plan to quit smoking and commit to it. Look for programs to help you, and ask your health care provider for recommendations and ideas. Talk with your health care provider about using nicotine replacement medicines to help you quit, such as gum, lozenges, patches, sprays, or pills. Make other lifestyle changes  Eat a healthy diet that includes calcium  and vitamin D . These nutrients are important for bone health. Get plenty of exercise. Weight-bearing and strength exercises are best for musculoskeletal health. Ask your health care provider what type of exercise is safe for you. If you drink alcohol: Limit how much you have to: 0-1 drink a day for women who are not pregnant. 0-2 drinks a day for men. Know how much alcohol is in your drink. In the [place] one drink equals one 12 oz bottle of beer (355 mL), one 5 oz glass of wine (148 mL), or one 1 oz glass of hard liquor (44 mL). Where to find more information You may find more information about the effects of smoking on musculoskeletal health, and quitting smoking from: American Academy of Orthopaedic Surgeons: orthoinfo.aaos.org [employer] of Health, Osteoporosis and Related Bone Diseases [employer]: bones.http://www.myers.net/ Contact a health care provider if: You need help to quit smoking. Summary When you smoke, the effects on your lungs and heart result in less oxygen for your musculoskeletal system. Even stopping smoking later in life can improve musculoskeletal health. Do not use any products that contain nicotine or tobacco, such as cigarettes and e-cigarettes. If you need help quitting, ask your health care provider. This information is not intended to replace advice given to you by your health care provider. Make sure you discuss any questions you have  with your health care provider. Document Revised: 01/24/2021 Document Reviewed: 01/24/2021 Elsevier Patient Education  2024 [employer].

## 2023-11-04 NOTE — Progress Notes (Signed)
 [facility] MD OP Progress Note  11/04/2023 12:12 PM Savannah [name]  MRN:  [id number]  Chief Complaint:  Chief Complaint  Patient presents with   Follow-up   Anxiety   Depression   Medication Refill   Discussed the use of AI scribe software for clinical note transcription with the patient, who gave verbal consent to proceed.  History of Present Illness Savannah [name] is a 38 year old Caucasian female, married, employed, lives in [place], has a history of generalized anxiety disorder, social anxiety, insomnia, vitamin D  deficiency, hyperlipidemia, recent tympanoplasty, psoriatic arthritis was evaluated in office today.  She experiences ongoing anxiety, managed with venlafaxine , and is satisfied with her current medication regimen and therapy sessions.   She has significant sleep disturbances primarily due to pain and shift changes at work. Hip swelling causes discomfort and disrupts her sleep. She takes Belsomra , which helps her fall asleep but does not alleviate the pain. Belsomra  can cause grogginess if she remains awake after taking it. Frequent nocturnal urination also interrupts her sleep. She takes medication for inflammation prescribed by her joint doctor, which has made a difference, although she still experiences significant hip pain.  She has a low thyroid  level, with a free T4 of 0.6, and is on a low dose of thyroid  medication.  She is attempting to cut back on smoking, particularly at home, but finds it challenging at work. She associates smoking with stress relief. She is also trying to increase her physical activity by walking more, using an app to track her steps.  She denies any suicidality, homicidality or perc reported eptual disturbances.   Visit Diagnosis:    ICD-10-CM   1. GAD (generalized anxiety disorder)  F41.1 venlafaxine  XR (EFFEXOR -XR) 37.5 MG 24 hr capsule    2. Social anxiety disorder  F40.10 venlafaxine  XR (EFFEXOR -XR) 37.5 MG 24 hr capsule    3. Insomnia  due to medical condition  G47.01    Lack of sleep hygiene, pain, shift work problem    4. Tobacco use disorder  F17.200       Past Psychiatric History: I have reviewed past psychiatric history from progress note on 06/04/2021.  Past trials of sertraline -headache, lorazepam-side effect, Klonopin-side effect, melatonin-did not work, Cymbalta -side effect, Ambien -excessive sleepiness.  Past Medical History:  Past Medical History:  Diagnosis Date   Anxiety    Depression    Eardrum rupture, left    Headache    Psoriatic arthritis (HCC)     Past Surgical History:  Procedure Laterality Date   CARPAL TUNNEL RELEASE Right 09/29/2014   Procedure: CARPAL TUNNEL RELEASE;  Surgeon: Lonni Sharps, MD;  Location: [facility] ORS;  Service: Orthopedics;  Laterality: Right;   CESAREAN SECTION     IUD placement     MYRINGOTOMY WITH TUBE PLACEMENT      Family Psychiatric History: I have reviewed family psychiatric history from progress note on 06/04/2021.  Family History:  Family History  Problem Relation Age of Onset   Healthy Mother    Bone cancer Maternal Grandfather    Mental illness Neg Hx     Social History: Reviewed social history from progress note on 06/04/2021. Social History   Socioeconomic History   Marital status: Married    Spouse name: katherine   Number of children: 3   Years of education: Not on file   Highest education level: High school graduate  Occupational History   Not on file  Tobacco Use   Smoking status: Some Days    Current packs/day:  0.00    Types: Cigarettes    Last attempt to quit: 06/14/2018    Years since quitting: 5.3    Passive exposure: Current   Smokeless tobacco: Never   Tobacco comments:    5-6 cigarettes daily  Vaping Use   Vaping status: Never Used  Substance and Sexual Activity   Alcohol use: Not Currently   Drug use: No   Sexual activity: Yes    Birth control/protection: I.U.D.  Other Topics Concern   Not on file  Social History  Narrative   Not on file   Social Drivers of Health   Financial Resource Strain: Medium Risk (06/05/2023)   Overall Financial Resource Strain (CARDIA)    Difficulty of Paying Living Expenses: Somewhat hard  Food Insecurity: No Food Insecurity (06/05/2023)   Hunger Vital Sign    Worried About Running Out of Food in the Last Year: Never true    Ran Out of Food in the Last Year: Never true  Transportation Needs: No Transportation Needs (06/05/2023)   PRAPARE - Administrator, Civil Service (Medical): No    Lack of Transportation (Non-Medical): No  Physical Activity: Inactive (06/05/2023)   Exercise Vital Sign    Days of Exercise per Week: 0 days    Minutes of Exercise per Session: 0 min  Stress: Stress Concern Present (06/05/2023)   [employer] of Occupational Health - Occupational Stress Questionnaire    Feeling of Stress : Rather much  Social Connections: Socially Isolated (06/05/2023)   Social Connection and Isolation Panel    Frequency of Communication with Friends and Family: Never    Frequency of Social Gatherings with Friends and Family: Never    Attends Religious Services: Never    Database administrator or Organizations: No    Attends Banker Meetings: Never    Marital Status: Married    Allergies:  Allergies  Allergen Reactions   Ibuprofen Other (See Comments)    Burns stomach    Metabolic Disorder Labs: No results found for: HGBA1C, MPG No results found for: PROLACTIN Lab Results  Component Value Date   CHOL 187 05/08/2023   TRIG 237 (H) 05/08/2023   HDL 33 (L) 05/08/2023   LDLCALC 113 (H) 05/08/2023   LDLCALC 124 (H) 02/21/2022   Lab Results  Component Value Date   TSH 1.970 09/11/2023   TSH 3.340 05/08/2023    Therapeutic Level Labs: No results found for: LITHIUM No results found for: VALPROATE No results found for: CBMZ  Current Medications: Current Outpatient Medications  Medication Sig Dispense Refill    guselkumab (TREMFYA) 100 MG/ML pen Inject 100 mLs into the skin once a week.     leflunomide (ARAVA) 10 MG tablet Take 10 mg by mouth daily.     levonorgestrel (MIRENA) 20 MCG/24HR IUD 1 each once by Intrauterine route.     levothyroxine  (SYNTHROID ) 50 MCG tablet Take 1 tablet (50 mcg total) by mouth daily. 90 tablet 3   modafinil  (PROVIGIL ) 200 MG tablet Take 1 tablet (200 mg total) by mouth daily. 30 tablet 2   omeprazole  (PRILOSEC) 40 MG capsule TAKE 1 CAPSULE (40 MG TOTAL) BY MOUTH DAILY. 30 capsule 1   Roflumilast  (ZORYVE ) 0.3 % CREA Apply to aa's psoriasis QHS. 60 g 5   rosuvastatin  (CRESTOR ) 5 MG tablet TAKE 1 TABLET (5 MG TOTAL) BY MOUTH DAILY. 30 tablet 3   Suvorexant  (BELSOMRA ) 10 MG TABS Take 1 tablet (10 mg total) by mouth at bedtime as  needed. 30 tablet 2   triamcinolone  (KENALOG ) 0.025 % cream Apply 1 Application topically 2 (two) times daily.     venlafaxine  XR (EFFEXOR -XR) 37.5 MG 24 hr capsule Take 1 capsule (37.5 mg total) by mouth daily with breakfast. Take along with 75 mg daily 90 capsule 0   venlafaxine  XR (EFFEXOR -XR) 75 MG 24 hr capsule Take 1 capsule (75 mg total) by mouth daily with breakfast. Take along with 37.5 mg daily 90 capsule 0   Vitamin D , Ergocalciferol , (DRISDOL ) 1.25 MG (50000 UNIT) CAPS capsule Take 50,000 Units by mouth once a week.     No current facility-administered medications for this visit.     Musculoskeletal: Strength & Muscle Tone: within normal limits Gait & Station: normal Patient leans: N/A  Psychiatric Specialty Exam: Review of Systems  Psychiatric/Behavioral:  Positive for sleep disturbance. The patient is nervous/anxious.     Blood pressure 125/89, pulse 93, temperature 97.9 F (36.6 C), temperature source Temporal, height 5' 3 (1.6 m), weight 169 lb 3.2 oz (76.7 kg).Body mass index is 29.97 kg/m.  General Appearance: Casual  Eye Contact:  Fair  Speech:  Clear and Coherent  Volume:  Normal  Mood:  Anxious  Affect:  Appropriate   Thought Process:  Goal Directed and Descriptions of Associations: Intact  Orientation:  Full (Time, Place, and Person)  Thought Content: Logical   Suicidal Thoughts:  No  Homicidal Thoughts:  No  Memory:  Immediate;   Fair Recent;   Fair Remote;   Fair  Judgement:  Fair  Insight:  Fair  Psychomotor Activity:  Normal  Concentration:  Concentration: Fair and Attention Span: Fair  Recall:  [employer] of Knowledge: Fair  Language: Fair  Akathisia:  No  Handed:  Right  AIMS (if indicated): not done  Assets:  Communication Skills Desire for Improvement Housing Social Support  ADL's:  Intact  Cognition: WNL  Sleep:  Poor due to pain   Screenings: AIMS    Flowsheet Row Office Visit from 12/23/2021 in [facility] Office Visit from 10/18/2021 in [facility] Office Visit from 09/05/2021 in [facility]  AIMS Total Score 0 0 0   GAD-7    Flowsheet Row Office Visit from 11/04/2023 in [facility] Counselor from 07/31/2023 in [facility] Counselor from 06/05/2023 in [facility] Office Visit from 05/14/2023 in [facility] Video Visit from 02/20/2023 in [facility]  Total GAD-7 Score 8 3 7 8 5    PHQ2-9    Flowsheet Row Office Visit from 11/04/2023 in [facility] Counselor from 07/31/2023 in [facility] Office Visit from 07/06/2023 in [facility] Counselor from 06/05/2023 in [facility] Office Visit from 05/14/2023 in [facility]  PHQ-2 Total Score 3 0 2 3 4   PHQ-9 Total Score 10 3 8  12 10    Flowsheet Row Office Visit from 11/04/2023 in [facility] Office Visit from 09/07/2023 in [facility] Office Visit from 07/06/2023 in [facility]  C-SSRS RISK CATEGORY No Risk No Risk No Risk     Assessment and Plan: Savannah [name] is a 38 year old Caucasian female, lives in [place], was evaluated in office  today.  Discussed assessment and plan as noted below.  Generalized anxiety disorder-improving Currently reports although has ongoing situational stresses and worries about her health she has been coping okay and is engaged in psychotherapy sessions which has been beneficial. Continue Venlafaxine  112.5 mg daily Continue psychotherapy sessions with Ms. Veva  Social anxiety disorder-stable Currently managed on the current medication regimen and therapy Continue Venlafaxine  112.5 mg daily Continue CBT  Insomnia-improving Recently started on Belsomra  by pulmonologist.  That has been beneficial.  She continues to struggle with the chief changes at work which does have an impact on her sleep as well as generalized pain which does affect her sleep. Continue Belsomra  10 mg at bedtime Continue sleep hygiene techniques. Continue Modafinil  as prescribed.  Tobacco use disorder-improving Currently working on cutting back on smoking Provided counseling for 1 minute.  Follow-up Follow-up in clinic in 3 months or sooner if needed.    Collaboration of Care: Collaboration of Care: Referral or follow-up with counselor/therapist AEB patient encouraged to continue CBT  Patient/Guardian was advised Release of Information must be obtained prior to any record release in order to collaborate their care with an outside provider. Patient/Guardian was advised if they have not already done so to contact the registration department to sign all necessary forms in order for us   to release information regarding their care.   Consent: Patient/Guardian gives verbal consent for treatment and assignment of benefits for services provided during this visit. Patient/Guardian expressed understanding and agreed to proceed.   This note was generated in part or whole with voice recognition software. Voice recognition is usually quite accurate but there are transcription errors that can and very often do occur. I apologize for any typographical errors that were not detected and corrected.    Kieryn Burtis, MD 11/04/2023, 12:12 PM

## 2023-11-09 ENCOUNTER — Other Ambulatory Visit: Payer: Self-pay | Admitting: Nurse Practitioner

## 2023-11-09 DIAGNOSIS — K219 Gastro-esophageal reflux disease without esophagitis: Secondary | ICD-10-CM

## 2023-11-19 ENCOUNTER — Ambulatory Visit: Admitting: Physician Assistant

## 2023-11-19 ENCOUNTER — Encounter: Payer: Self-pay | Admitting: Physician Assistant

## 2023-11-19 VITALS — BP 99/84 | HR 90 | Temp 98.6°F | Resp 16 | Ht 63.0 in | Wt 169.4 lb

## 2023-11-19 DIAGNOSIS — E039 Hypothyroidism, unspecified: Secondary | ICD-10-CM

## 2023-11-19 DIAGNOSIS — R202 Paresthesia of skin: Secondary | ICD-10-CM

## 2023-11-19 DIAGNOSIS — R200 Anesthesia of skin: Secondary | ICD-10-CM

## 2023-11-19 DIAGNOSIS — L4050 Arthropathic psoriasis, unspecified: Secondary | ICD-10-CM

## 2023-11-19 DIAGNOSIS — R251 Tremor, unspecified: Secondary | ICD-10-CM

## 2023-11-19 DIAGNOSIS — R42 Dizziness and giddiness: Secondary | ICD-10-CM

## 2023-11-19 LAB — TSH+FREE T4
Free T4: 1 ng/dL (ref 0.82–1.77)
TSH: 0.975 u[IU]/mL (ref 0.450–4.500)

## 2023-11-19 NOTE — Progress Notes (Signed)
 [facility] [address] [place], [place] [address]  Internal MEDICINE  Office Visit Note  Patient Name: Savannah [name]  [id number]  [id number]  Date of Service: 11/19/2023  Chief Complaint  Patient presents with   Follow-up   Depression    HPI Pt is here for routine follow up for hypothyroidism -Taking synthroid  first thing in AM when she gets home before eating, doing well with this -Thyroid  labs now normal and will continue current dose -taking sleep medication from pulmonary to help with getting to sleep when needed on altered sleep schedule. 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 Does have psoriatic arthritis and has chronic pain from this -if she stands and puts arms down will have numbness in first 2 fingers in both arms and feeling like pulling in arm, started on left side but now gets it on right as well. Hands also shaking a lot. Hx of carpal tunnel release on right. -can get vertigo periodically as well -Rheumatology did xray of neck and didn't see anything.  -discussed neuro eval -has lost some weight recently, normal appetite for the most part, does mention heat can impact this some. Down 6 lbs since visit in April. May be due to thyroid  hormone now being normalized, however will monitor and advised to call if continued unplanned wt loss  Current Medication: Outpatient Encounter Medications as of 11/19/2023  Medication Sig   guselkumab (TREMFYA) 100 MG/ML pen Inject 100 mLs into the skin once a week.   leflunomide (ARAVA) 10 MG tablet Take 10 mg by mouth daily.   levonorgestrel (MIRENA) 20 MCG/24HR IUD 1 each once by Intrauterine route.   levothyroxine  (SYNTHROID ) 50 MCG tablet Take 1 tablet (50 mcg total) by mouth daily.   modafinil  (PROVIGIL ) 200 MG tablet Take 1 tablet (200 mg total) by mouth daily.   omeprazole  (PRILOSEC) 40 MG capsule TAKE 1 CAPSULE (40 MG TOTAL) BY MOUTH DAILY.    Roflumilast  (ZORYVE ) 0.3 % CREA Apply to aa's psoriasis QHS.   rosuvastatin  (CRESTOR ) 5 MG tablet TAKE 1 TABLET (5 MG TOTAL) BY MOUTH DAILY.   Suvorexant  (BELSOMRA ) 10 MG TABS Take 1 tablet (10 mg total) by mouth at bedtime as needed.   triamcinolone  (KENALOG ) 0.025 % cream Apply 1 Application topically 2 (two) times daily.   venlafaxine  XR (EFFEXOR -XR) 37.5 MG 24 hr capsule Take 1 capsule (37.5 mg total) by mouth daily with breakfast. Take along with 75 mg daily   venlafaxine  XR (EFFEXOR -XR) 75 MG 24 hr capsule Take 1 capsule (75 mg total) by mouth daily with breakfast. Take along with 37.5 mg daily   Vitamin D , Ergocalciferol , (DRISDOL ) 1.25 MG (50000 UNIT) CAPS capsule Take 50,000 Units by mouth once a week.   No facility-administered encounter medications on file as of 11/19/2023.    Surgical History: Past Surgical History:  Procedure Laterality Date   CARPAL TUNNEL RELEASE Right 09/29/2014   Procedure: CARPAL TUNNEL RELEASE;  Surgeon: Lonni Sharps, MD;  Location: [facility] ORS;  Service: Orthopedics;  Laterality: Right;   CESAREAN SECTION     IUD placement     MYRINGOTOMY WITH TUBE PLACEMENT      Medical History: Past Medical History:  Diagnosis Date   Anxiety    Depression    Eardrum rupture, left    Headache    Psoriatic arthritis (HCC)     Family History: Family History  Problem Relation Age of Onset   Healthy Mother  Bone cancer Maternal Grandfather    Mental illness Neg Hx     Social History   Socioeconomic History   Marital status: Married    Spouse name: katherine   Number of children: 3   Years of education: Not on file   Highest education level: High school graduate  Occupational History   Not on file  Tobacco Use   Smoking status: Some Days    Current packs/day: 0.00    Types: Cigarettes    Last attempt to quit: 06/14/2018    Years since quitting: 5.4    Passive exposure: Current   Smokeless tobacco: Never   Tobacco comments:    5-6 cigarettes  daily  Vaping Use   Vaping status: Never Used  Substance and Sexual Activity   Alcohol use: Not Currently   Drug use: No   Sexual activity: Yes    Birth control/protection: I.U.D.  Other Topics Concern   Not on file  Social History Narrative   Not on file   Social Drivers of Health   Financial Resource Strain: Medium Risk (06/05/2023)   Overall Financial Resource Strain (CARDIA)    Difficulty of Paying Living Expenses: Somewhat hard  Food Insecurity: No Food Insecurity (06/05/2023)   Hunger Vital Sign    Worried About Running Out of Food in the Last Year: Never true    Ran Out of Food in the Last Year: Never true  Transportation Needs: No Transportation Needs (06/05/2023)   PRAPARE - Administrator, Civil Service (Medical): No    Lack of Transportation (Non-Medical): No  Physical Activity: Inactive (06/05/2023)   Exercise Vital Sign    Days of Exercise per Week: 0 days    Minutes of Exercise per Session: 0 min  Stress: Stress Concern Present (06/05/2023)   [employer] of Occupational Health - Occupational Stress Questionnaire    Feeling of Stress : Rather much  Social Connections: Socially Isolated (06/05/2023)   Social Connection and Isolation Panel    Frequency of Communication with Friends and Family: Never    Frequency of Social Gatherings with Friends and Family: Never    Attends Religious Services: Never    Database administrator or Organizations: No    Attends Banker Meetings: Never    Marital Status: Married  Catering manager Violence: Not At Risk (06/05/2023)   Humiliation, Afraid, Rape, and Kick questionnaire    Fear of Current or Ex-Partner: No    Emotionally Abused: No    Physically Abused: No    Sexually Abused: No      Review of Systems  Constitutional:  Positive for fatigue. Negative for chills and unexpected weight change.  HENT:  Negative for congestion, rhinorrhea, sneezing and sore throat.   Eyes:  Negative for redness.   Respiratory:  Negative for cough, chest tightness and shortness of breath.   Cardiovascular:  Negative for chest pain and palpitations.  Gastrointestinal:  Negative for abdominal pain, constipation, diarrhea, nausea and vomiting.  Genitourinary:  Negative for dysuria and frequency.  Musculoskeletal:  Positive for arthralgias. Negative for back pain, joint swelling and neck pain.  Skin:  Negative for rash.  Neurological:  Positive for tremors and numbness.  Hematological:  Negative for adenopathy. Does not bruise/bleed easily.  Psychiatric/Behavioral:  Positive for sleep disturbance. Negative for behavioral problems (Depression) and suicidal ideas. The patient is nervous/anxious.     Vital Signs: BP 99/84   Pulse 90 Comment: 108  Temp 98.6 F (37 C)  Resp 16   Ht 5' 3 (1.6 m)   Wt 169 lb 6.4 oz (76.8 kg)   SpO2 97%   BMI 30.01 kg/m    Physical Exam Vitals and nursing note reviewed.  Constitutional:      General: She is not in acute distress.    Appearance: Normal appearance. She is not ill-appearing.  HENT:     Head: Normocephalic and atraumatic.  Cardiovascular:     Rate and Rhythm: Normal rate and regular rhythm.  Pulmonary:     Effort: Pulmonary effort is normal. No respiratory distress.  Skin:    General: Skin is warm and dry.  Neurological:     Mental Status: She is alert and oriented to person, place, and time.  Psychiatric:        Mood and Affect: Mood normal.        Behavior: Behavior normal.        Assessment/Plan: 1. Hypothyroidism, unspecified type (Primary) Stable, continue synthroid  as before  2. Numbness and tingling in both hands Will refer to neurology for further evaluation - Ambulatory referral to Neurology  3. Tremor of both hands - Ambulatory referral to Neurology  4. Vertigo - Ambulatory referral to Neurology  5. Psoriatic arthritis (HCC) Followed by rheumatology   General Counseling: Rosina oakland understanding of the  findings of todays visit and agrees with plan of treatment. I have discussed any further diagnostic evaluation that may be needed or ordered today. We also reviewed her medications today. she has been encouraged to call the office with any questions or concerns that should arise related to todays visit.    Orders Placed This Encounter  Procedures   Ambulatory referral to Neurology    No orders of the defined types were placed in this encounter.   This patient was seen by Tinnie Pro, PA-C in collaboration with Dr. Sigrid Bathe as a part of collaborative care agreement.   Total time spent:30 Minutes Time spent includes review of chart, medications, test results, and follow up plan with the patient.      Dr Fozia M Khan Internal medicine

## 2023-11-20 ENCOUNTER — Ambulatory Visit (INDEPENDENT_AMBULATORY_CARE_PROVIDER_SITE_OTHER): Admitting: Professional Counselor

## 2023-11-20 DIAGNOSIS — F411 Generalized anxiety disorder: Secondary | ICD-10-CM | POA: Diagnosis not present

## 2023-11-20 DIAGNOSIS — F331 Major depressive disorder, recurrent, moderate: Secondary | ICD-10-CM | POA: Diagnosis not present

## 2023-11-20 NOTE — Progress Notes (Unsigned)
  THERAPIST PROGRESS NOTE  Virtual Visit via Video Note  I connected with Savannah [name] on 11/22/23 at 10:00 AM EDT by a video enabled telemedicine application and verified that I am speaking with the correct person using two identifiers.  Location: Patient: Home Provider: Remote office   I discussed the limitations of evaluation and management by telemedicine and the availability of in person appointments. The patient expressed understanding and agreed to proceed.   I discussed the assessment and treatment plan with the patient. The patient was provided an opportunity to ask questions and all were answered. The patient agreed with the plan and demonstrated an understanding of the instructions.   The patient was advised to call back or seek an in-person evaluation if the symptoms worsen or if the condition fails to improve as anticipated.  I provided 33 minutes of non-face-to-face time during this encounter. Savannah [name], [facility]  Session Time: 10:01 AM - 10:34 AM  Participation Level: Active  Behavioral Response: Casual, Alert, Euthymic  Type of Therapy: Individual Therapy  Treatment Goals addressed: Active OP Depression  LTG: I would say to process and share emotions in a healthy manner because I know I do not.                 Start:  06/18/23    Expected End:  06/16/24      STG: Continuous self-confidence. To improve concept of self AEB utilizing daily affirmations, restructuring core beliefs about self, and a self-report on improvements over the next 90 days.     STG: I guess to be better at expressing how I feel. To improve emotion identification and communication AEB psychoeducation on emotions and utilizing role-playing and communication skills to share feelings 3 out of 7 days a week.    STG: Maintain abstinence from substance use AEB utilizing positive replacement behaviors to manage pain over the next 90 days    ProgressTowards Goals:  Progressing  Interventions: Motivational Interviewing and Supportive  Summary: Savannah [name] is a 38 y.o. female who presents with a history of anxiety and depression. She appeared alert and oriented x5. She stated she is doing well overall. She has a referral to a neurologist to check her nerves due to her shaking. She reported work is still boring but she thinks it's safest to stick with it for now. She reported her family is doing well. Savannah [name] was in agreement with her discharge from OPT.   Therapist Response: Conducted session with Savannah. Began session with check-in/update since previous session. Utilized empathetic and reflective listening. Used open-ended questions to facilitate discussion and summarized thoughts/feelings. Discussed and agreed upon successful discharge from therapy. Savannah [name] of her ability to return to services in the future if symptoms return/worsen.  Suicidal/Homicidal: No  Plan: Successful discharge from OPT  Diagnosis: GAD (generalized anxiety disorder)  Major depressive disorder, recurrent episode, moderate (HCC)  Collaboration of Care: Medication Management AEB chart review  Patient/Guardian was advised Release of Information must be obtained prior to any record release in order to collaborate their care with an outside provider. Patient/Guardian was advised if they have not already done so to contact the registration department to sign all necessary forms in order for us  to release information regarding their care.   Consent: Patient/Guardian gives verbal consent for treatment and assignment of benefits for services provided during this visit. Patient/Guardian expressed understanding and agreed to proceed.   Savannah [name], [facility] 11/22/2023

## 2023-11-24 ENCOUNTER — Telehealth: Payer: Self-pay | Admitting: Physician Assistant

## 2023-11-24 NOTE — Telephone Encounter (Signed)
 Neurology referral sent via [facility] to [facility]. Notified patient. Gave telephone # [phone]

## 2023-12-09 ENCOUNTER — Ambulatory Visit: Admitting: Nurse Practitioner

## 2023-12-09 ENCOUNTER — Encounter: Payer: Self-pay | Admitting: Nurse Practitioner

## 2023-12-09 ENCOUNTER — Telehealth: Payer: Self-pay | Admitting: Nurse Practitioner

## 2023-12-09 ENCOUNTER — Telehealth: Payer: Self-pay | Admitting: Physician Assistant

## 2023-12-09 VITALS — BP 130/74 | HR 86 | Temp 96.1°F | Resp 16 | Ht 63.0 in | Wt 169.6 lb

## 2023-12-09 DIAGNOSIS — H1132 Conjunctival hemorrhage, left eye: Secondary | ICD-10-CM

## 2023-12-09 DIAGNOSIS — H579 Unspecified disorder of eye and adnexa: Secondary | ICD-10-CM

## 2023-12-09 NOTE — Telephone Encounter (Signed)
 Urgent Ophthalmology referral sent via [facility] to [facility]. Lvm notifyinf patient. Gave telephone # [phone]

## 2023-12-09 NOTE — Telephone Encounter (Signed)
 error

## 2023-12-09 NOTE — Progress Notes (Signed)
 [facility] [address] [place], [place] [address]  Internal MEDICINE  Office Visit Note  Patient Name: Savannah [name]  [id number]  [id number]  Date of Service: 12/09/2023  Chief Complaint  Patient presents with   Eye Problem    Blood in left eye     HPI Savannah [name] presents for an acute sick visit for blood in the left eye --noticed blood on the surface of her left eye yesterday.  No known injury to the eye. Patient denies any change in her vision or decrease in her visual field but does report tenderness of the left eye and pressure behind the left eye.      Current Medication:  Outpatient Encounter Medications as of 12/09/2023  Medication Sig   guselkumab (TREMFYA) 100 MG/ML pen Inject 100 mLs into the skin once a week.   leflunomide (ARAVA) 10 MG tablet Take 10 mg by mouth daily.   levonorgestrel (MIRENA) 20 MCG/24HR IUD 1 each once by Intrauterine route.   levothyroxine  (SYNTHROID ) 50 MCG tablet Take 1 tablet (50 mcg total) by mouth daily.   modafinil  (PROVIGIL ) 200 MG tablet Take 1 tablet (200 mg total) by mouth daily.   omeprazole  (PRILOSEC) 40 MG capsule TAKE 1 CAPSULE (40 MG TOTAL) BY MOUTH DAILY.   Roflumilast  (ZORYVE ) 0.3 % CREA Apply to aa's psoriasis QHS.   rosuvastatin  (CRESTOR ) 5 MG tablet TAKE 1 TABLET (5 MG TOTAL) BY MOUTH DAILY.   Suvorexant  (BELSOMRA ) 10 MG TABS Take 1 tablet (10 mg total) by mouth at bedtime as needed.   triamcinolone  (KENALOG ) 0.025 % cream Apply 1 Application topically 2 (two) times daily.   venlafaxine  XR (EFFEXOR -XR) 37.5 MG 24 hr capsule Take 1 capsule (37.5 mg total) by mouth daily with breakfast. Take along with 75 mg daily   venlafaxine  XR (EFFEXOR -XR) 75 MG 24 hr capsule Take 1 capsule (75 mg total) by mouth daily with breakfast. Take along with 37.5 mg daily   Vitamin D , Ergocalciferol , (DRISDOL ) 1.25 MG (50000 UNIT) CAPS capsule Take 50,000 Units by mouth once a week.   No facility-administered encounter medications  on file as of 12/09/2023.      Medical History: Past Medical History:  Diagnosis Date   Anxiety    Depression    Eardrum rupture, left    Headache    Psoriatic arthritis (HCC)      Vital Signs: BP 130/74 (BP Location: Left Arm, Patient Position: Sitting, Cuff Size: Normal)   Pulse 86   Temp (!) 96.1 F (35.6 C) (Temporal)   Resp 16   Ht 5' 3 (1.6 m)   Wt 169 lb 9.6 oz (76.9 kg)   SpO2 96% Comment: room air  BMI 30.04 kg/m    Review of Systems  Constitutional:  Positive for fatigue.  Eyes:  Positive for pain. Negative for visual disturbance.       Eye tenderness and pressure behind the left eye  Genitourinary:  Positive for pelvic pain.    Physical Exam Constitutional:      General: She is not in acute distress.    Appearance: Normal appearance. She is obese. She is not ill-appearing.  HENT:     Head: Normocephalic and atraumatic.  Eyes:     General: Lids are normal.     Conjunctiva/sclera:     Left eye: Hemorrhage present.     Comments: The hemorrhage cover the lateral sclera of the left eye, no changes in visual field at this time but patient still reports tenderness  and pressure. No known injury to the eye.   Cardiovascular:     Rate and Rhythm: Normal rate and regular rhythm.  Pulmonary:     Effort: Pulmonary effort is normal. No respiratory distress.  Neurological:     Mental Status: She is alert and oriented to person, place, and time.  Psychiatric:        Mood and Affect: Mood normal.        Behavior: Behavior normal.       Assessment/Plan: 1. Scleral hemorrhage of left eye (Primary) Referred to ophthalmology urgently  - Ambulatory referral to Ophthalmology  2. Eye pressure Referred to ophthalmology urgently - Ambulatory referral to Ophthalmology   General Counseling: Savannah [name] understanding of the findings of todays visit and agrees with plan of treatment. I have discussed any further diagnostic evaluation that may be needed or  ordered today. We also reviewed her medications today. she has been encouraged to call the office with any questions or concerns that should arise related to todays visit.    Counseling:    Orders Placed This Encounter  Procedures   Ambulatory referral to Ophthalmology    No orders of the defined types were placed in this encounter.   Return if symptoms worsen or fail to improve, for referred to ophthalmology, also keep regular f/u with lauren pcp.  [place] Controlled Substance Database was reviewed by me for overdose risk score (ORS)  Time spent:20 Minutes Time spent with patient included reviewing progress notes, labs, imaging studies, and discussing plan for follow up.   This patient was seen by Mardy Maxin, FNP-C in collaboration with Dr. Sigrid Bathe as a part of collaborative care agreement.  Edward Guthmiller R. Maxin, MSN, FNP-C Internal Medicine

## 2023-12-11 ENCOUNTER — Other Ambulatory Visit: Payer: Self-pay | Admitting: Psychiatry

## 2023-12-11 DIAGNOSIS — F4010 Social phobia, unspecified: Secondary | ICD-10-CM

## 2023-12-11 DIAGNOSIS — F411 Generalized anxiety disorder: Secondary | ICD-10-CM

## 2023-12-18 ENCOUNTER — Other Ambulatory Visit: Payer: Self-pay | Admitting: Student

## 2023-12-18 DIAGNOSIS — R42 Dizziness and giddiness: Secondary | ICD-10-CM

## 2023-12-18 DIAGNOSIS — R251 Tremor, unspecified: Secondary | ICD-10-CM

## 2023-12-29 ENCOUNTER — Encounter: Payer: Self-pay | Admitting: Student

## 2024-01-13 ENCOUNTER — Other Ambulatory Visit: Payer: Self-pay | Admitting: Nurse Practitioner

## 2024-01-13 DIAGNOSIS — K219 Gastro-esophageal reflux disease without esophagitis: Secondary | ICD-10-CM

## 2024-01-14 ENCOUNTER — Telehealth: Payer: Self-pay | Admitting: Physician Assistant

## 2024-01-14 NOTE — Telephone Encounter (Signed)
 Neurology appointment was 12/11/2023 @ [facility]

## 2024-01-28 ENCOUNTER — Ambulatory Visit
Admission: RE | Admit: 2024-01-28 | Discharge: 2024-01-28 | Disposition: A | Discharge status: Home / Self Care | Source: Ambulatory Visit | Attending: Student | Admitting: Student

## 2024-01-28 DIAGNOSIS — R251 Tremor, unspecified: Secondary | ICD-10-CM

## 2024-01-28 DIAGNOSIS — R42 Dizziness and giddiness: Secondary | ICD-10-CM

## 2024-02-04 ENCOUNTER — Encounter: Payer: Self-pay | Admitting: Psychiatry

## 2024-02-04 ENCOUNTER — Telehealth (INDEPENDENT_AMBULATORY_CARE_PROVIDER_SITE_OTHER): Admitting: Psychiatry

## 2024-02-04 DIAGNOSIS — F17200 Nicotine dependence, unspecified, uncomplicated: Secondary | ICD-10-CM

## 2024-02-04 DIAGNOSIS — G4701 Insomnia due to medical condition: Secondary | ICD-10-CM | POA: Diagnosis not present

## 2024-02-04 DIAGNOSIS — F4010 Social phobia, unspecified: Secondary | ICD-10-CM

## 2024-02-04 DIAGNOSIS — F17210 Nicotine dependence, cigarettes, uncomplicated: Secondary | ICD-10-CM

## 2024-02-04 DIAGNOSIS — F411 Generalized anxiety disorder: Secondary | ICD-10-CM | POA: Diagnosis not present

## 2024-02-04 MED ORDER — VENLAFAXINE HCL ER 75 MG PO CP24: 75.0000 mg | ORAL_CAPSULE | Freq: Every day | ORAL | Status: DC

## 2024-02-04 NOTE — Progress Notes (Signed)
 Virtual Visit via Video Note  I connected with Savannah [name] on 02/04/24 at  9:10 AM EDT by a video enabled telemedicine application and verified that I am speaking with the correct person using two identifiers.  Location Provider Location : [facility] Patient Location : Home  Participants: Patient , Provider   I discussed the limitations of evaluation and management by telemedicine and the availability of in person appointments. The patient expressed understanding and agreed to proceed.   I discussed the assessment and treatment plan with the patient. The patient was provided an opportunity to ask questions and all were answered. The patient agreed with the plan and demonstrated an understanding of the instructions.   The patient was advised to call back or seek an in-person evaluation if the symptoms worsen or if the condition fails to improve as anticipated.   BH MD OP Progress Note  02/04/2024 2:20 PM Savannah [name]  MRN:  [id number]  Chief Complaint:  Chief Complaint  Patient presents with   Follow-up   Anxiety   Depression   Medication Refill   Discussed the use of AI scribe software for clinical note transcription with the patient, who gave verbal consent to proceed.  History of Present Illness Savannah [name] is a 38 year old Caucasian female, married, employed, lives in [place], has a history of GAD, social anxiety, insomnia, vitamin D  deficiency, hyperlipidemia, recent tympanoplasty, psoriatic arthritis was evaluated by telemedicine today.  Over the past several months, she describes feeling emotionally clogged, experiencing difficulty expressing emotions and a sense of internal pressure. She notes that emotions and tears rise but she cannot cry or release them, identifying this as a change from her usual ability to express emotions, particularly since being with her wife. While she acknowledges a history of bottling up emotions, she states that this current experience  feels different and more pervasive.  Sleep difficulties continue, and she takes Belsomra  as needed. She reports that Belsomra  helps her fall asleep about 60% of the time, with effectiveness varying depending on pain levels and daily stressors. She does not take Belsomra  every night due to concerns about needing to remain alert for emergencies involving her children. She observes that her sleep generally improves when she is not experiencing significant pain or after less demanding days with fewer appointments.  Currently, she takes venlafaxine  112.5 mg daily. She reports no recent changes to her medications prior to this visit.   She denies any thoughts of harming herself or others.  She reported discontinuing therapy last month.  Previously used to follow-up with Ms. Veva.     Visit Diagnosis:    ICD-10-CM   1. GAD (generalized anxiety disorder)  F41.1 venlafaxine  XR (EFFEXOR -XR) 75 MG 24 hr capsule    2. Social anxiety disorder  F40.10 venlafaxine  XR (EFFEXOR -XR) 75 MG 24 hr capsule    3. Insomnia due to medical condition  G47.01    Lack of sleep hygiene, shift at work, pain    4. Tobacco use disorder  F17.200       Past Psychiatric History: I have reviewed past psychiatric history from progress note on 06/04/2021.  Past trials of sertraline -headaches, lorazepam-side effect, Klonopin-side effect, melatonin-did not work, Cymbalta -side effect, Ambien -excessive sleepiness.  Past Medical History:  Past Medical History:  Diagnosis Date   Anxiety    Depression    Eardrum rupture, left    Headache    Psoriatic arthritis (HCC)     Past Surgical History:  Procedure Laterality Date   CARPAL TUNNEL RELEASE  Right 09/29/2014   Procedure: CARPAL TUNNEL RELEASE;  Surgeon: Lonni Sharps, MD;  Location: [facility] ORS;  Service: Orthopedics;  Laterality: Right;   CESAREAN SECTION     IUD placement     MYRINGOTOMY WITH TUBE PLACEMENT      Family Psychiatric History: I have reviewed family  psychiatric history from progress note on 06/04/2021.  Family History:  Family History  Problem Relation Age of Onset   Healthy Mother    Bone cancer Maternal Grandfather    Mental illness Neg Hx     Social History: I have reviewed social history from progress note on 06/04/2021. Social History   Socioeconomic History   Marital status: Married    Spouse name: katherine   Number of children: 3   Years of education: Not on file   Highest education level: High school graduate  Occupational History   Not on file  Tobacco Use   Smoking status: Some Days    Current packs/day: 0.00    Types: Cigarettes    Last attempt to quit: 06/14/2018    Years since quitting: 5.6    Passive exposure: Current   Smokeless tobacco: Never   Tobacco comments:    5-6 cigarettes daily  Vaping Use   Vaping status: Never Used  Substance and Sexual Activity   Alcohol use: Not Currently   Drug use: No   Sexual activity: Yes    Birth control/protection: I.U.D.  Other Topics Concern   Not on file  Social History Narrative   Not on file   Social Drivers of Health   Financial Resource Strain: Medium Risk (12/11/2023)   Received from [facility]   Overall Financial Resource Strain (CARDIA)    Difficulty of Paying Living Expenses: Somewhat hard  Food Insecurity: Food Insecurity Present (12/11/2023)   Received from [facility]   Hunger Vital Sign    Within the past 12 months, you worried that your food would run out before you got the money to buy more.: Sometimes true    Within the past 12 months, the food you bought just didn't last and you didn't have money to get more.: Sometimes true  Transportation Needs: No Transportation Needs (12/11/2023)   Received from [facility] - Transportation    In the past 12 months, has lack of transportation kept you from medical appointments or from getting medications?: No    Lack of Transportation  (Non-Medical): No  Physical Activity: Inactive (06/05/2023)   Exercise Vital Sign    Days of Exercise per Week: 0 days    Minutes of Exercise per Session: 0 min  Stress: Stress Concern Present (06/05/2023)   [name] [employer] - Occupational Stress Questionnaire    Feeling of Stress : Rather much  Social Connections: Socially Isolated (06/05/2023)   Social Connection and Isolation Panel    Frequency of Communication with Friends and Family: Never    Frequency of Social Gatherings with Friends and Family: Never    Attends Religious Services: Never    Database administrator or Organizations: No    Attends Banker Meetings: Never    Marital Status: Married    Allergies:  Allergies  Allergen Reactions   Ibuprofen Other (See Comments)    Burns stomach    Metabolic Disorder Labs: No results found for: HGBA1C, MPG No results found for: PROLACTIN Lab Results  Component Value Date   CHOL 187 05/08/2023   TRIG  237 (H) 05/08/2023   HDL 33 (L) 05/08/2023   LDLCALC 113 (H) 05/08/2023   LDLCALC 124 (H) 02/21/2022   Lab Results  Component Value Date   TSH 0.975 11/18/2023   TSH 1.970 09/11/2023    Therapeutic Level Labs: No results found for: LITHIUM No results found for: VALPROATE No results found for: CBMZ  Current Medications: Current Outpatient Medications  Medication Sig Dispense Refill   guselkumab (TREMFYA) 100 MG/ML pen Inject 100 mLs into the skin once a week.     leflunomide (ARAVA) 10 MG tablet Take 10 mg by mouth daily.     levonorgestrel (MIRENA) 20 MCG/24HR IUD 1 each once by Intrauterine route.     levothyroxine  (SYNTHROID ) 50 MCG tablet Take 1 tablet (50 mcg total) by mouth daily. 90 tablet 3   modafinil  (PROVIGIL ) 200 MG tablet Take 1 tablet (200 mg total) by mouth daily. 30 tablet 2   omeprazole  (PRILOSEC) 40 MG capsule TAKE 1 CAPSULE (40 MG TOTAL) BY MOUTH DAILY. 30 capsule 1   Roflumilast  (ZORYVE ) 0.3 % CREA  Apply to aa's psoriasis QHS. 60 g 5   rosuvastatin  (CRESTOR ) 5 MG tablet TAKE 1 TABLET (5 MG TOTAL) BY MOUTH DAILY. 30 tablet 3   Suvorexant  (BELSOMRA ) 10 MG TABS Take 1 tablet (10 mg total) by mouth at bedtime as needed. 30 tablet 2   triamcinolone  (KENALOG ) 0.025 % cream Apply 1 Application topically 2 (two) times daily.     venlafaxine  XR (EFFEXOR -XR) 75 MG 24 hr capsule Take 1 capsule (75 mg total) by mouth daily with breakfast.     Vitamin D , Ergocalciferol , (DRISDOL ) 1.25 MG (50000 UNIT) CAPS capsule Take 50,000 Units by mouth once a week.     No current facility-administered medications for this visit.     Musculoskeletal: Strength & Muscle Tone: UTA Gait & Station: Seated Patient leans: N/A  Psychiatric Specialty Exam: Review of Systems  Hematological:        Emotional numbing  Psychiatric/Behavioral:  The patient is nervous/anxious.     There were no vitals taken for this visit.There is no height or weight on file to calculate BMI.  General Appearance: Casual  Eye Contact:  Fair  Speech:  Clear and Coherent  Volume:  Normal  Mood:  Anxious and emotional numbing  Affect:  Appropriate  Thought Process:  Goal Directed and Descriptions of Associations: Intact  Orientation:  Full (Time, Place, and Person)  Thought Content: Logical   Suicidal Thoughts:  No  Homicidal Thoughts:  No  Memory:  Immediate;   Fair Recent;   Fair Remote;   Fair  Judgement:  Fair  Insight:  Fair  Psychomotor Activity:  Normal  Concentration:  Concentration: Fair and Attention Span: Fair  Recall:  [employer] of Knowledge: Fair  Language: Fair  Akathisia:  No  Handed:  Right  AIMS (if indicated): not done  Assets:  Communication Skills Desire for Improvement Housing Social Support Transportation  ADL's:  Intact  Cognition: WNL  Sleep:  overall ok with sleep , pain does affect   Screenings: AIMS    Flowsheet Row Office Visit from 12/23/2021 in [facility] Office Visit from 10/18/2021 in [facility] Office Visit from 09/05/2021 in [facility]  AIMS Total Score 0 0 0   GAD-7    Flowsheet Row Office Visit from 11/04/2023 in [facility] Counselor from 07/31/2023 in [facility] Counselor from 06/05/2023 in [facility] Office Visit from 05/14/2023 in [facility] Video Visit from 02/20/2023 in [facility]  Total GAD-7 Score 8 3 7 8 5    PHQ2-9    Flowsheet Row Office Visit from 11/04/2023 in [facility] Counselor from 07/31/2023 in [facility] Office Visit from 07/06/2023 in [facility] Counselor from 06/05/2023 in [facility] Office Visit from 05/14/2023 in [facility]  PHQ-2 Total Score 3 0 2 3 4   PHQ-9 Total Score 10 3 8 12 10    Flowsheet Row Video Visit from 02/04/2024 in [facility] Office Visit from 11/04/2023 in [facility] Office Visit from 09/07/2023 in [facility]  C-SSRS RISK CATEGORY No Risk No Risk No Risk     Assessment and Plan: Alexanderia Gorby is a 38 year old Caucasian female, lives in [place] was evaluated by telemedicine today.  Discussed assessment and plan as noted below.  1. GAD (generalized anxiety disorder)-improving Currently reports worried about her recent health issues and has upcoming appointment with neurology for further investigation.  Currently coping well with her anxiety symptoms however worried about side  effects of Venlafaxine  likely causing emotional numbing.,  Interested in a dosage reduction. Continue Venlafaxine , however will reduce to 75 mg daily Continue psychotherapy sessions as needed  2. Social anxiety disorder-stable Currently well-managed on current medication regimen Continue Venlafaxine  75 mg daily, dose reduction.  3. Insomnia due to medical condition-improving Currently sleep is overall better on the Belsomra  although pain does have an effect. Continue Belsomra  10 mg at bedtime Continue sleep hygiene techniques Continue Modafinil  as prescribed  4. Tobacco use disorder-improving Currently working on quitting. Will reevaluate in future sessions.   Consent: Patient/Guardian gives verbal consent for treatment and assignment of benefits for services provided during this visit. Patient/Guardian expressed understanding and agreed to proceed.  This note was generated in part or whole with voice recognition software. Voice recognition is usually quite accurate but there are transcription errors that can and very often do occur. I apologize for any typographical errors that were not detected and corrected.     Khoi Hamberger, MD 02/04/2024, 2:20 PM

## 2024-02-10 ENCOUNTER — Other Ambulatory Visit: Payer: Self-pay | Admitting: Nurse Practitioner

## 2024-02-10 DIAGNOSIS — G4726 Circadian rhythm sleep disorder, shift work type: Secondary | ICD-10-CM

## 2024-02-17 ENCOUNTER — Other Ambulatory Visit: Payer: Self-pay | Admitting: Physician Assistant

## 2024-02-18 ENCOUNTER — Encounter: Payer: Self-pay | Admitting: Physician Assistant

## 2024-02-18 ENCOUNTER — Ambulatory Visit: Payer: Commercial Managed Care - PPO | Admitting: Physician Assistant

## 2024-02-18 VITALS — BP 112/83 | HR 106 | Temp 98.0°F | Resp 16 | Ht 63.0 in | Wt 173.0 lb

## 2024-02-18 DIAGNOSIS — E039 Hypothyroidism, unspecified: Secondary | ICD-10-CM | POA: Diagnosis not present

## 2024-02-18 DIAGNOSIS — R200 Anesthesia of skin: Secondary | ICD-10-CM

## 2024-02-18 DIAGNOSIS — Z0001 Encounter for general adult medical examination with abnormal findings: Secondary | ICD-10-CM | POA: Diagnosis not present

## 2024-02-18 DIAGNOSIS — G4726 Circadian rhythm sleep disorder, shift work type: Secondary | ICD-10-CM

## 2024-02-18 DIAGNOSIS — R251 Tremor, unspecified: Secondary | ICD-10-CM | POA: Diagnosis not present

## 2024-02-18 DIAGNOSIS — R202 Paresthesia of skin: Secondary | ICD-10-CM

## 2024-02-18 MED ORDER — MODAFINIL 200 MG PO TABS
200.0000 mg | ORAL_TABLET | Freq: Every day | ORAL | 2 refills | Status: AC
Start: 2024-02-18 — End: ?

## 2024-02-18 NOTE — Progress Notes (Signed)
 [facility] [address] [place], [place] [address]  Internal MEDICINE  Office Visit Note  Patient Name: Savannah [name]  [id number]  [id number]  Date of Service: 02/18/2024  Chief Complaint  Patient presents with   Annual Exam     HPI Pt is here for routine health maintenance examination -due for labs, will recheck thyroid  for continued monitoring -neurology did some tests but did not find anything. Still has tingling and tremors. Will be having a follow up in a few months -psychiatry lowered effexor  to 75mg , but in looking at meds they did mention her leflunamide can rarely cause neuro S/E and neurology agreed small portion can have S/E and needs to discuss with rheumatology. She goes to rheum tomorrow and plans to discuss as she doesn't think it is helping anyway. She also has recent joint pain in right big toe and is going to have them check this as well -last pap in 2022 with GYN, states she thinks her IUD is due to be replaced next year and will follow up with GYN then -does need modafinil  refills, follow up with sleep provider not scheduled and will do so now. Will send refills in the meantime  Current Medication: Outpatient Encounter Medications as of 02/18/2024  Medication Sig   guselkumab (TREMFYA) 100 MG/ML pen Inject 100 mLs into the skin once a week.   leflunomide (ARAVA) 10 MG tablet Take 10 mg by mouth daily.   levonorgestrel (MIRENA) 20 MCG/24HR IUD 1 each once by Intrauterine route.   levothyroxine  (SYNTHROID ) 50 MCG tablet Take 1 tablet (50 mcg total) by mouth daily.   omeprazole  (PRILOSEC) 40 MG capsule TAKE 1 CAPSULE (40 MG TOTAL) BY MOUTH DAILY.   rosuvastatin  (CRESTOR ) 5 MG tablet TAKE 1 TABLET (5 MG TOTAL) BY MOUTH DAILY.   Suvorexant  (BELSOMRA ) 10 MG TABS Take 1 tablet (10 mg total) by mouth at bedtime as needed.   venlafaxine  XR (EFFEXOR -XR) 75 MG 24 hr capsule Take 1 capsule (75 mg total) by mouth daily with breakfast.   Vitamin D ,  Ergocalciferol , (DRISDOL ) 1.25 MG (50000 UNIT) CAPS capsule Take 50,000 Units by mouth once a week.   [DISCONTINUED] modafinil  (PROVIGIL ) 200 MG tablet TAKE 1 TABLET BY MOUTH EVERY DAY   [DISCONTINUED] Roflumilast  (ZORYVE ) 0.3 % CREA Apply to aa's psoriasis QHS.   [DISCONTINUED] triamcinolone  (KENALOG ) 0.025 % cream Apply 1 Application topically 2 (two) times daily.   modafinil  (PROVIGIL ) 200 MG tablet Take 1 tablet (200 mg total) by mouth daily.   No facility-administered encounter medications on file as of 02/18/2024.    Surgical History: Past Surgical History:  Procedure Laterality Date   CARPAL TUNNEL RELEASE Right 09/29/2014   Procedure: CARPAL TUNNEL RELEASE;  Surgeon: Lonni Sharps, MD;  Location: [facility] ORS;  Service: Orthopedics;  Laterality: Right;   CESAREAN SECTION     IUD placement     MYRINGOTOMY WITH TUBE PLACEMENT      Medical History: Past Medical History:  Diagnosis Date   Anxiety    Depression    Eardrum rupture, left    Headache    Psoriatic arthritis (HCC)     Family History: Family History  Problem Relation Age of Onset   Healthy Mother    Bone cancer Maternal Grandfather    Mental illness Neg Hx       Review of Systems  Constitutional:  Negative for chills and unexpected weight change.  HENT:  Negative for congestion, rhinorrhea, sneezing and sore throat.   Eyes:  Negative for redness.  Respiratory:  Negative for cough, chest tightness and shortness of breath.   Cardiovascular:  Negative for chest pain and palpitations.  Gastrointestinal:  Negative for abdominal pain, constipation, diarrhea, nausea and vomiting.  Genitourinary:  Negative for dysuria and frequency.  Musculoskeletal:  Positive for arthralgias. Negative for back pain, joint swelling and neck pain.  Skin:  Negative for rash.  Neurological:  Positive for tremors and numbness.  Hematological:  Negative for adenopathy. Does not bruise/bleed easily.  Psychiatric/Behavioral:  Positive  for sleep disturbance. Negative for behavioral problems (Depression) and suicidal ideas. The patient is nervous/anxious.      Vital Signs: BP 112/83   Pulse (!) 106   Temp 98 F (36.7 C)   Resp 16   Ht 5' 3 (1.6 m)   Wt 173 lb (78.5 kg)   SpO2 96%   BMI 30.65 kg/m    Physical Exam Vitals reviewed.  Constitutional:      Appearance: Normal appearance. She is obese.  HENT:     Head: Normocephalic and atraumatic.     Mouth/Throat:     Mouth: Mucous membranes are moist.     Pharynx: No oropharyngeal exudate.  Eyes:     Pupils: Pupils are equal, round, and reactive to light.  Cardiovascular:     Rate and Rhythm: Normal rate and regular rhythm.  Pulmonary:     Effort: Pulmonary effort is normal. No respiratory distress.  Abdominal:     Palpations: Abdomen is soft.     Tenderness: There is no abdominal tenderness.  Musculoskeletal:        General: Normal range of motion.     Cervical back: Normal range of motion.  Skin:    General: Skin is warm and dry.  Neurological:     Mental Status: She is alert and oriented to person, place, and time.  Psychiatric:        Mood and Affect: Mood normal.        Behavior: Behavior normal.      LABS: No results found for this or any previous visit (from the past 2160 hours).      Assessment/Plan: 1. Encounter for general adult medical examination with abnormal findings (Primary) CPE performed, lab slip given, UTD on pap  2. Circadian rhythm sleep disorder, shift work type Refills given, will schedule Sleep follow up - modafinil  (PROVIGIL ) 200 MG tablet; Take 1 tablet (200 mg total) by mouth daily.  Dispense: 30 tablet; Refill: 2  3. Hypothyroidism, unspecified type Will recheck labs and adjust synthroid  as indicated  4. Numbness and tingling in both hands Following with neurology and will discuss possible med S/E with rheumatology  5. Tremor of both hands Following with neurology and will discuss possible med S/E with  rheumatology   General Counseling: namiah dunnavant understanding of the findings of todays visit and agrees with plan of treatment. I have discussed any further diagnostic evaluation that may be needed or ordered today. We also reviewed her medications today. she has been encouraged to call the office with any questions or concerns that should arise related to todays visit.    Counseling:    No orders of the defined types were placed in this encounter.   Meds ordered this encounter  Medications   modafinil  (PROVIGIL ) 200 MG tablet    Sig: Take 1 tablet (200 mg total) by mouth daily.    Dispense:  30 tablet    Refill:  2    Will send additional  refills at her visit this week    This patient was seen by Tinnie Pro, PA-C in collaboration with Dr. Sigrid Bathe as a part of collaborative care agreement.  Total time spent:35 Minutes  Time spent includes review of chart, medications, test results, and follow up plan with the patient.     Sigrid CHRISTELLA Bathe, MD  Internal Medicine

## 2024-02-29 ENCOUNTER — Ambulatory Visit: Admitting: Internal Medicine

## 2024-02-29 ENCOUNTER — Encounter: Payer: Self-pay | Admitting: Internal Medicine

## 2024-02-29 VITALS — BP 113/85 | HR 99 | Temp 98.3°F | Resp 16 | Ht 63.0 in | Wt 170.4 lb

## 2024-02-29 DIAGNOSIS — G4726 Circadian rhythm sleep disorder, shift work type: Secondary | ICD-10-CM

## 2024-02-29 DIAGNOSIS — F17200 Nicotine dependence, unspecified, uncomplicated: Secondary | ICD-10-CM

## 2024-02-29 DIAGNOSIS — K219 Gastro-esophageal reflux disease without esophagitis: Secondary | ICD-10-CM | POA: Diagnosis not present

## 2024-02-29 NOTE — Patient Instructions (Signed)
 Steps to Quit Smoking Smoking tobacco is the leading cause of preventable death. It can affect almost every organ in the body. Smoking puts you and people around you at risk for many serious, long-lasting (chronic) diseases. Quitting smoking can be hard, but it is one of the best things that you can do for your health. It is never too late to quit. Do not give up if you cannot quit the first time. Some people need to try many times to quit. Do your best to stick to your quit plan, and talk with your doctor if you have any questions or concerns. How do I get ready to quit? Pick a date to quit. Set a date within the next 2 weeks to give you time to prepare. Write down the reasons why you are quitting. Keep this list in places where you will see it often. Tell your family, friends, and co-workers that you are quitting. Their support is important. Talk with your doctor about the choices that may help you quit. Find out if your health insurance will pay for these treatments. Know the people, places, things, and activities that make you want to smoke (triggers). Avoid them. What first steps can I take to quit smoking? Throw away all cigarettes at home, at work, and in your car. Throw away the things that you use when you smoke, such as ashtrays and lighters. Clean your car. Empty the ashtray. Clean your home, including curtains and carpets. What can I do to help me quit smoking? Talk with your doctor about taking medicines and seeing a counselor. You are more likely to succeed when you do both. If you are pregnant or breastfeeding: Talk with your doctor about counseling or other ways to quit smoking. Do not take medicine to help you quit smoking unless your doctor tells you to. Quit right away Quit smoking completely, instead of slowly cutting back on how much you smoke over a period of time. Stopping smoking right away may be more successful than slowly quitting. Go to counseling. In-person is best  if this is an option. You are more likely to quit if you go to counseling sessions regularly. Take medicine You may take medicines to help you quit. Some medicines need a prescription, and some you can buy over-the-counter. Some medicines may contain a drug called nicotine to replace the nicotine in cigarettes. Medicines may: Help you stop having the desire to smoke (cravings). Help to stop the problems that come when you stop smoking (withdrawal symptoms). Your doctor may ask you to use: Nicotine patches, gum, or lozenges. Nicotine inhalers or sprays. Non-nicotine medicine that you take by mouth. Find resources Find resources and other ways to help you quit smoking and remain smoke-free after you quit. They include: Online chats with a Veterinary surgeon. Phone quitlines. Printed Materials engineer. Support groups or group counseling. Text messaging programs. Mobile phone apps. Use apps on your mobile phone or tablet that can help you stick to your quit plan. Examples of free services include Quit Guide from the CDC and smokefree.gov  What can I do to make it easier to quit?  Talk to your family and friends. Ask them to support and encourage you. Call a phone quitline, such as 1-800-QUIT-NOW, reach out to support groups, or work with a Veterinary surgeon. Ask people who smoke to not smoke around you. Avoid places that make you want to smoke, such as: Bars. Parties. Smoke-break areas at work. Spend time with people who do not smoke. Lower  the stress in your life. Stress can make you want to smoke. Try these things to lower stress: Getting regular exercise. Doing deep-breathing exercises. Doing yoga. Meditating. What benefits will I see if I quit smoking? Over time, you may have: A better sense of smell and taste. Less coughing and sore throat. A slower heart rate. Lower blood pressure. Clearer skin. Better breathing. Fewer sick days. Summary Quitting smoking can be hard, but it is one of  the best things that you can do for your health. Do not give up if you cannot quit the first time. Some people need to try many times to quit. When you decide to quit smoking, make a plan to help you succeed. Quit smoking right away, not slowly over a period of time. When you start quitting, get help and support to keep you smoke-free. This information is not intended to replace advice given to you by your health care provider. Make sure you discuss any questions you have with your health care provider. Document Revised: 04/19/2021 Document Reviewed: 04/19/2021 Elsevier Patient Education  2024 [employer].

## 2024-02-29 NOTE — Progress Notes (Signed)
 [facility] [address] [place], [place] [address]  Pulmonary Sleep Medicine   Office Visit Note  Patient Name: Savannah [name] DOB: 04-05-1986 MRN [id number]  Date of Service: 02/29/2024  Complaints/HPI: She has been doing well with the modafinil . Patient states seems to be working. She states she can still dose off if she becames inactive. She states that she is definitely better than she was prior to using modafinil . Patient has no fogginess. Noted. She has no major side effects. She is trying to get 8h sleep daily  Office Spirometry Results:     ROS  General: (-) fever, (-) chills, (-) night sweats, (-) weakness Skin: (-) rashes, (-) itching,. Eyes: (-) visual changes, (-) redness, (-) itching. Nose and Sinuses: (-) nasal stuffiness or itchiness, (-) postnasal drip, (-) nosebleeds, (-) sinus trouble. Mouth and Throat: (-) sore throat, (-) hoarseness. Neck: (-) swollen glands, (-) enlarged thyroid , (-) neck pain. Respiratory: - cough, (-) bloody sputum, - shortness of breath, - wheezing. Cardiovascular: - ankle swelling, (-) chest pain. Lymphatic: (-) lymph node enlargement. Neurologic: (-) numbness, (-) tingling. Psychiatric: (-) anxiety, (-) depression   Current Medication: Outpatient Encounter Medications as of 02/29/2024  Medication Sig   guselkumab (TREMFYA) 100 MG/ML pen Inject 100 mLs into the skin once a week.   levonorgestrel (MIRENA) 20 MCG/24HR IUD 1 each once by Intrauterine route.   levothyroxine  (SYNTHROID ) 50 MCG tablet Take 1 tablet (50 mcg total) by mouth daily.   meloxicam  (MOBIC ) 7.5 MG tablet Take 7.5 mg by mouth daily.   modafinil  (PROVIGIL ) 200 MG tablet Take 1 tablet (200 mg total) by mouth daily.   omeprazole  (PRILOSEC) 40 MG capsule TAKE 1 CAPSULE (40 MG TOTAL) BY MOUTH DAILY.   rosuvastatin  (CRESTOR ) 5 MG tablet TAKE 1 TABLET (5 MG TOTAL) BY MOUTH DAILY.   Suvorexant  (BELSOMRA ) 10 MG TABS Take 1 tablet (10 mg total) by mouth at  bedtime as needed.   venlafaxine  XR (EFFEXOR -XR) 75 MG 24 hr capsule Take 1 capsule (75 mg total) by mouth daily with breakfast.   [DISCONTINUED] leflunomide (ARAVA) 10 MG tablet Take 10 mg by mouth daily.   [DISCONTINUED] Vitamin D , Ergocalciferol , (DRISDOL ) 1.25 MG (50000 UNIT) CAPS capsule Take 50,000 Units by mouth once a week.   No facility-administered encounter medications on file as of 02/29/2024.    Surgical History: Past Surgical History:  Procedure Laterality Date   CARPAL TUNNEL RELEASE Right 09/29/2014   Procedure: CARPAL TUNNEL RELEASE;  Surgeon: Lonni Sharps, MD;  Location: [facility] ORS;  Service: Orthopedics;  Laterality: Right;   CESAREAN SECTION     IUD placement     MYRINGOTOMY WITH TUBE PLACEMENT      Medical History: Past Medical History:  Diagnosis Date   Anxiety    Depression    Eardrum rupture, left    Headache    Psoriatic arthritis (HCC)     Family History: Family History  Problem Relation Age of Onset   Healthy Mother    Bone cancer Maternal Grandfather    Mental illness Neg Hx     Social History: Social History   Socioeconomic History   Marital status: Married    Spouse name: katherine   Number of children: 3   Years of education: Not on file   Highest education level: High school graduate  Occupational History   Not on file  Tobacco Use   Smoking status: Some Days    Current packs/day: 0.00    Types: Cigarettes  Last attempt to quit: 06/14/2018    Years since quitting: 5.7    Passive exposure: Current   Smokeless tobacco: Never   Tobacco comments:    5-6 cigarettes daily  Vaping Use   Vaping status: Never Used  Substance and Sexual Activity   Alcohol use: Not Currently   Drug use: No   Sexual activity: Yes    Birth control/protection: I.U.D.  Other Topics Concern   Not on file  Social History Narrative   Not on file   Social Drivers of Health   Financial Resource Strain: Medium Risk (12/11/2023)   Received from [facility]   Overall Financial Resource Strain (CARDIA)    Difficulty of Paying Living Expenses: Somewhat hard  Food Insecurity: Food Insecurity Present (12/11/2023)   Received from [facility]   Hunger Vital Sign    Within the past 12 months, you worried that your food would run out before you got the money to buy more.: Sometimes true    Within the past 12 months, the food you bought just didn't last and you didn't have money to get more.: Sometimes true  Transportation Needs: No Transportation Needs (12/11/2023)   Received from [facility] - Transportation    In the past 12 months, has lack of transportation kept you from medical appointments or from getting medications?: No    Lack of Transportation (Non-Medical): No  Physical Activity: Inactive (06/05/2023)   Exercise Vital Sign    Days of Exercise per Week: 0 days    Minutes of Exercise per Session: 0 min  Stress: Stress Concern Present (06/05/2023)   [employer] [facility] [employer] - Occupational Stress Questionnaire    Feeling of Stress : Rather much  Social Connections: Socially Isolated (06/05/2023)   Social Connection and Isolation Panel    Frequency of Communication with Friends and Family: Never    Frequency of Social Gatherings with Friends and Family: Never    Attends Religious Services: Never    Database administrator or Organizations: No    Attends Banker Meetings: Never    Marital Status: Married  Catering manager Violence: Not At Risk (06/05/2023)   Humiliation, Afraid, Rape, and Kick questionnaire    Fear of Current or Ex-Partner: No    Emotionally Abused: No    Physically Abused: No    Sexually Abused: No    Vital Signs: Blood pressure 113/85, pulse 99, temperature 98.3 F (36.8 C), resp. rate 16, height 5' 3 (1.6 m), weight 170 lb 6.4 oz (77.3 kg), SpO2 97%.  Examination: General Appearance: The patient is well-developed,  well-nourished, and in no distress. Skin: Gross inspection of skin unremarkable. Head: normocephalic, no gross deformities. Eyes: no gross deformities noted. ENT: ears appear grossly normal no exudates. Neck: Supple. No thyromegaly. No LAD. Respiratory: no rhonchi. Cardiovascular: Normal S1 and S2 without murmur or rub. Extremities: No cyanosis. pulses are equal. Neurologic: Alert and oriented. No involuntary movements.  LABS: No results found for this or any previous visit (from the past 2160 hours).  Radiology: MR BRAIN WO CONTRAST Result Date: 01/28/2024 EXAM: MRI BRAIN WITHOUT CONTRAST 01/28/2024 06:35:27 PM TECHNIQUE: Multiplanar multisequence MRI of the head/brain was performed without the administration of intravenous contrast. COMPARISON: None available. CLINICAL HISTORY: Numbness and tingling in both hands, tremor of both hands, and vertigo. FINDINGS: BRAIN AND VENTRICLES: No acute infarct. No intracranial hemorrhage. No mass. No midline shift. No hydrocephalus. The sella is  unremarkable. Normal flow voids. ORBITS: No acute abnormality. SINUSES AND MASTOIDS: Small left mastoid effusion. BONES AND SOFT TISSUES: Normal marrow signal. No acute soft tissue abnormality. IMPRESSION: 1. No acute intracranial abnormality. 2. Small left mastoid effusion. Electronically signed by: Donnice Mania MD 01/28/2024 06:52 PM EDT RP Workstation: [id number]    No results found.  No results found.  Assessment and Plan: Patient Active Problem List   Diagnosis Date Noted   Long-term use of immunosuppressant medication 09/02/2022   Psoriatic arthritis (HCC) 09/02/2022   Left chronic otitis media 08/21/2022   GAD (generalized anxiety disorder) 06/05/2021   Social anxiety disorder 06/05/2021   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   Insomnia 06/05/2021   Tobacco use disorder 06/05/2021   Encounter for general adult medical examination with abnormal findings 01/17/2020   Dysuria 01/17/2020    Constipation 10/18/2019   Chronic right shoulder pain 10/18/2019   Leg cramps 08/24/2019   Upper back pain on left side 06/21/2019   Need for prophylactic vaccination with combined diphtheria-tetanus-pertussis (DTaP) vaccine 03/18/2019   Circadian rhythm sleep disorder, 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   Generalized abdominal tenderness without rebound tenderness 02/03/2019   Chronic low back pain 02/03/2019   Excessive daytime sleepiness 08/29/2018   Fatigue 08/29/2018   Atopic dermatitis 08/17/2018   Gastroesophageal reflux disease without esophagitis 08/17/2018   Vitamin D  deficiency 08/17/2018   Carpal tunnel syndrome 06/01/2018   Bacterial folliculitis 06/01/2018   Irritable bowel syndrome with both constipation and diarrhea 06/01/2018   Genital warts 03/26/2017   Depression 01/11/2015   Psoriasis 01/11/2015   Ankle pain 01/11/2015    1. Circadian rhythm sleep disorder, shift work type (Primary) On modafinil . She has good response will continue with current dosage as it is adequate  2. Smoker STOP smoking. She states she is smoking 5 cig per day She will get a followup PFT done  3. Gastroesophageal reflux disease without esophagitis On PPI and she is under good control   General Counseling: I have discussed the findings of the evaluation and examination with Savannah [name].  I have also discussed any further diagnostic evaluation thatmay be needed or ordered today. Savannah [name] verbalizes understanding of the findings of todays visit. We also reviewed her medications today and discussed drug interactions and side effects including but not limited excessive drowsiness and altered mental states. We also discussed that there is always a risk not just to her but also people around her. she has been encouraged to call the office with any questions or concerns that should arise related to todays visit.  No orders of the defined types were placed in this encounter.    Time spent: 91  I have  personally obtained a history, examined the patient, evaluated laboratory and imaging results, formulated the assessment and plan and placed orders.    Elfreda DELENA Bathe, MD [facility] Pulmonary and Critical Care Sleep medicine

## 2024-03-08 ENCOUNTER — Other Ambulatory Visit: Payer: Self-pay | Admitting: Psychiatry

## 2024-03-08 DIAGNOSIS — F411 Generalized anxiety disorder: Secondary | ICD-10-CM

## 2024-03-08 DIAGNOSIS — F4010 Social phobia, unspecified: Secondary | ICD-10-CM

## 2024-03-20 ENCOUNTER — Other Ambulatory Visit: Payer: Self-pay | Admitting: Nurse Practitioner

## 2024-03-20 DIAGNOSIS — K219 Gastro-esophageal reflux disease without esophagitis: Secondary | ICD-10-CM

## 2024-05-02 ENCOUNTER — Ambulatory Visit: Admitting: Psychiatry

## 2024-05-16 ENCOUNTER — Telehealth: Payer: Self-pay

## 2024-05-16 ENCOUNTER — Encounter: Payer: Self-pay | Admitting: Physician Assistant

## 2024-05-16 NOTE — Telephone Encounter (Signed)
 As per lauren advised pt that need to go to Urgent care due she is having nausea,headache and uti

## 2024-05-17 ENCOUNTER — Other Ambulatory Visit: Payer: Self-pay | Admitting: Physician Assistant

## 2024-05-18 LAB — CBC WITH DIFFERENTIAL/PLATELET
Basophils Absolute: 0.1 x10E3/uL (ref 0.0–0.2)
Basos: 1 %
EOS (ABSOLUTE): 0.2 x10E3/uL (ref 0.0–0.4)
Eos: 1 %
Hematocrit: 42.6 % (ref 34.0–46.6)
Hemoglobin: 14.1 g/dL (ref 11.1–15.9)
Immature Grans (Abs): 0.1 x10E3/uL (ref 0.0–0.1)
Immature Granulocytes: 1 %
Lymphocytes Absolute: 4 x10E3/uL — ABNORMAL HIGH (ref 0.7–3.1)
Lymphs: 37 %
MCH: 30.7 pg (ref 26.6–33.0)
MCHC: 33.1 g/dL (ref 31.5–35.7)
MCV: 93 fL (ref 79–97)
Monocytes Absolute: 0.7 x10E3/uL (ref 0.1–0.9)
Monocytes: 7 %
Neutrophils Absolute: 5.7 x10E3/uL (ref 1.4–7.0)
Neutrophils: 53 %
Platelets: 312 x10E3/uL (ref 150–450)
RBC: 4.59 x10E6/uL (ref 3.77–5.28)
RDW: 12.8 % (ref 11.7–15.4)
WBC: 10.7 x10E3/uL (ref 3.4–10.8)

## 2024-05-18 LAB — B12 AND FOLATE PANEL
Folate: 18.2 ng/mL
Vitamin B-12: 571 pg/mL (ref 232–1245)

## 2024-05-18 LAB — LIPID PANEL WITH LDL/HDL RATIO
Cholesterol, Total: 157 mg/dL (ref 100–199)
HDL: 33 mg/dL — ABNORMAL LOW
LDL Chol Calc (NIH): 100 mg/dL — ABNORMAL HIGH (ref 0–99)
LDL/HDL Ratio: 3 ratio (ref 0.0–3.2)
Triglycerides: 132 mg/dL (ref 0–149)
VLDL Cholesterol Cal: 24 mg/dL (ref 5–40)

## 2024-05-18 LAB — COMPREHENSIVE METABOLIC PANEL WITH GFR
ALT: 15 IU/L (ref 0–32)
AST: 19 IU/L (ref 0–40)
Albumin: 4.6 g/dL (ref 3.9–4.9)
Alkaline Phosphatase: 76 IU/L (ref 41–116)
BUN/Creatinine Ratio: 16 (ref 9–23)
BUN: 13 mg/dL (ref 6–20)
Bilirubin Total: 0.5 mg/dL (ref 0.0–1.2)
CO2: 19 mmol/L — ABNORMAL LOW (ref 20–29)
Calcium: 9.6 mg/dL (ref 8.7–10.2)
Chloride: 101 mmol/L (ref 96–106)
Creatinine, Ser: 0.79 mg/dL (ref 0.57–1.00)
Globulin, Total: 2.7 g/dL (ref 1.5–4.5)
Glucose: 87 mg/dL (ref 70–99)
Potassium: 3.8 mmol/L (ref 3.5–5.2)
Sodium: 138 mmol/L (ref 134–144)
Total Protein: 7.3 g/dL (ref 6.0–8.5)
eGFR: 99 mL/min/1.73

## 2024-05-18 LAB — TSH: TSH: 2.53 u[IU]/mL (ref 0.450–4.500)

## 2024-05-18 LAB — VITAMIN D 25 HYDROXY (VIT D DEFICIENCY, FRACTURES): Vit D, 25-Hydroxy: 22.5 ng/mL — ABNORMAL LOW (ref 30.0–100.0)

## 2024-05-18 LAB — T4, FREE: Free T4: 1.25 ng/dL (ref 0.82–1.77)

## 2024-05-20 ENCOUNTER — Other Ambulatory Visit: Payer: Self-pay | Admitting: Physician Assistant

## 2024-05-26 ENCOUNTER — Other Ambulatory Visit: Payer: Self-pay | Admitting: Nurse Practitioner

## 2024-05-26 DIAGNOSIS — K219 Gastro-esophageal reflux disease without esophagitis: Secondary | ICD-10-CM

## 2024-06-08 ENCOUNTER — Telehealth: Admitting: Psychiatry

## 2024-06-08 ENCOUNTER — Encounter: Payer: Self-pay | Admitting: Psychiatry

## 2024-06-08 DIAGNOSIS — G4726 Circadian rhythm sleep disorder, shift work type: Secondary | ICD-10-CM | POA: Diagnosis not present

## 2024-06-08 DIAGNOSIS — F411 Generalized anxiety disorder: Secondary | ICD-10-CM

## 2024-06-08 DIAGNOSIS — F17200 Nicotine dependence, unspecified, uncomplicated: Secondary | ICD-10-CM

## 2024-06-08 DIAGNOSIS — F4010 Social phobia, unspecified: Secondary | ICD-10-CM | POA: Diagnosis not present

## 2024-06-08 DIAGNOSIS — F17210 Nicotine dependence, cigarettes, uncomplicated: Secondary | ICD-10-CM

## 2024-06-08 DIAGNOSIS — G4701 Insomnia due to medical condition: Secondary | ICD-10-CM

## 2024-06-08 MED ORDER — VENLAFAXINE HCL ER 37.5 MG PO CP24: 37.5000 mg | ORAL_CAPSULE | Freq: Every day | ORAL | 0 refills | Status: AC
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
## 2024-06-14 ENCOUNTER — Ambulatory Visit: Payer: Self-pay | Admitting: Physician Assistant

## 2024-06-14 NOTE — Telephone Encounter (Signed)
-----   Message from Tinnie MARLA Pro sent at 06/14/2024  9:43 AM EST ----- Please let her know cholesterol is improving. Vit D low now and can take OTC vit D to help get this back in range without it getting too high again. Lymphocytes also continue to be high which can  sometimes happen with autoimmune conditions/medications, however we may consider a hematology consult just to be thorough. I can go ahead and place this if she would like or we can discuss further  next visit.

## 2024-07-06 ENCOUNTER — Telehealth: Admitting: Psychiatry

## 2024-08-18 ENCOUNTER — Ambulatory Visit: Admitting: Physician Assistant

## 2024-08-24 ENCOUNTER — Encounter: Admitting: Internal Medicine

## 2024-09-05 ENCOUNTER — Ambulatory Visit: Admitting: Internal Medicine

## 2025-02-20 ENCOUNTER — Encounter: Admitting: Physician Assistant
